# Patient Record
Sex: Female | Born: 1970 | Race: Black or African American | Hispanic: No | Marital: Single | Smoking: Never smoker
Health system: Southern US, Community
[De-identification: ages and names within clinical notes are randomized; demographics above are authoritative.]

## PROBLEM LIST (undated history)

## (undated) DIAGNOSIS — N840 Polyp of corpus uteri: Secondary | ICD-10-CM

## (undated) DIAGNOSIS — Z9221 Personal history of antineoplastic chemotherapy: Secondary | ICD-10-CM

## (undated) DIAGNOSIS — Z86711 Personal history of pulmonary embolism: Secondary | ICD-10-CM

## (undated) DIAGNOSIS — Z923 Personal history of irradiation: Secondary | ICD-10-CM

## (undated) DIAGNOSIS — N939 Abnormal uterine and vaginal bleeding, unspecified: Secondary | ICD-10-CM

## (undated) DIAGNOSIS — Z853 Personal history of malignant neoplasm of breast: Secondary | ICD-10-CM

## (undated) DIAGNOSIS — F419 Anxiety disorder, unspecified: Secondary | ICD-10-CM

## (undated) DIAGNOSIS — R030 Elevated blood-pressure reading, without diagnosis of hypertension: Secondary | ICD-10-CM

## (undated) DIAGNOSIS — Z973 Presence of spectacles and contact lenses: Secondary | ICD-10-CM

## (undated) DIAGNOSIS — M8580 Other specified disorders of bone density and structure, unspecified site: Secondary | ICD-10-CM

## (undated) DIAGNOSIS — J309 Allergic rhinitis, unspecified: Secondary | ICD-10-CM

## (undated) DIAGNOSIS — Z86718 Personal history of other venous thrombosis and embolism: Secondary | ICD-10-CM

## (undated) HISTORY — DX: Personal history of malignant neoplasm of breast: Z85.3

## (undated) HISTORY — DX: Anxiety disorder, unspecified: F41.9

## (undated) HISTORY — PX: OTHER SURGICAL HISTORY: SHX169

---

## 2000-09-24 ENCOUNTER — Encounter: Payer: Self-pay | Admitting: Emergency Medicine

## 2000-09-24 ENCOUNTER — Emergency Department (HOSPITAL_COMMUNITY): Admission: EM | Admit: 2000-09-24 | Discharge: 2000-09-24 | Payer: Self-pay | Admitting: Emergency Medicine

## 2004-02-14 ENCOUNTER — Emergency Department (HOSPITAL_COMMUNITY): Admission: EM | Admit: 2004-02-14 | Discharge: 2004-02-15 | Payer: Self-pay | Admitting: Emergency Medicine

## 2004-03-14 ENCOUNTER — Encounter: Admission: RE | Admit: 2004-03-14 | Discharge: 2004-03-14 | Payer: Self-pay | Admitting: Cardiology

## 2004-04-17 ENCOUNTER — Other Ambulatory Visit: Admission: RE | Admit: 2004-04-17 | Discharge: 2004-04-17 | Payer: Self-pay | Admitting: Obstetrics & Gynecology

## 2004-11-21 ENCOUNTER — Emergency Department (HOSPITAL_COMMUNITY): Admission: EM | Admit: 2004-11-21 | Discharge: 2004-11-21 | Payer: Self-pay | Admitting: Emergency Medicine

## 2005-08-05 ENCOUNTER — Emergency Department (HOSPITAL_COMMUNITY): Admission: EM | Admit: 2005-08-05 | Discharge: 2005-08-05 | Payer: Self-pay | Admitting: Family Medicine

## 2005-09-19 ENCOUNTER — Encounter: Admission: RE | Admit: 2005-09-19 | Discharge: 2005-09-19 | Payer: Self-pay | Admitting: Occupational Medicine

## 2007-01-05 ENCOUNTER — Emergency Department (HOSPITAL_COMMUNITY): Admission: EM | Admit: 2007-01-05 | Discharge: 2007-01-05 | Payer: Self-pay | Admitting: Family Medicine

## 2008-11-15 ENCOUNTER — Ambulatory Visit: Payer: Self-pay | Admitting: Sports Medicine

## 2008-11-15 DIAGNOSIS — K219 Gastro-esophageal reflux disease without esophagitis: Secondary | ICD-10-CM | POA: Insufficient documentation

## 2008-11-15 DIAGNOSIS — M20039 Swan-neck deformity of unspecified finger(s): Secondary | ICD-10-CM | POA: Insufficient documentation

## 2009-01-31 ENCOUNTER — Emergency Department (HOSPITAL_COMMUNITY): Admission: EM | Admit: 2009-01-31 | Discharge: 2009-01-31 | Payer: Self-pay | Admitting: Family Medicine

## 2009-11-03 ENCOUNTER — Encounter: Admission: RE | Admit: 2009-11-03 | Discharge: 2009-11-03 | Payer: Self-pay | Admitting: Obstetrics and Gynecology

## 2009-11-10 ENCOUNTER — Encounter: Admission: RE | Admit: 2009-11-10 | Discharge: 2009-11-10 | Payer: Self-pay | Admitting: Obstetrics and Gynecology

## 2009-11-18 ENCOUNTER — Ambulatory Visit (HOSPITAL_COMMUNITY): Admission: RE | Admit: 2009-11-18 | Discharge: 2009-11-18 | Payer: Self-pay | Admitting: General Surgery

## 2009-11-21 ENCOUNTER — Ambulatory Visit: Payer: Self-pay | Admitting: Genetic Counselor

## 2009-11-29 ENCOUNTER — Ambulatory Visit (HOSPITAL_COMMUNITY): Admission: RE | Admit: 2009-11-29 | Discharge: 2009-11-29 | Payer: Self-pay | Admitting: General Surgery

## 2009-11-30 ENCOUNTER — Encounter: Admission: RE | Admit: 2009-11-30 | Discharge: 2009-11-30 | Payer: Self-pay | Admitting: General Surgery

## 2009-11-30 ENCOUNTER — Ambulatory Visit (HOSPITAL_BASED_OUTPATIENT_CLINIC_OR_DEPARTMENT_OTHER): Admission: RE | Admit: 2009-11-30 | Discharge: 2009-12-01 | Payer: Self-pay | Admitting: General Surgery

## 2009-11-30 HISTORY — PX: BREAST LUMPECTOMY WITH AXILLARY LYMPH NODE DISSECTION: SHX5756

## 2009-12-20 ENCOUNTER — Ambulatory Visit: Payer: Self-pay | Admitting: Oncology

## 2009-12-20 LAB — CBC WITH DIFFERENTIAL/PLATELET
BASO%: 0.7 % (ref 0.0–2.0)
EOS%: 0.1 % (ref 0.0–7.0)
LYMPH%: 32.6 % (ref 14.0–49.7)
MCH: 34.6 pg — ABNORMAL HIGH (ref 25.1–34.0)
MCHC: 33.8 g/dL (ref 31.5–36.0)
MCV: 102.3 fL — ABNORMAL HIGH (ref 79.5–101.0)
MONO#: 0.4 10*3/uL (ref 0.1–0.9)
MONO%: 6.9 % (ref 0.0–14.0)
Platelets: 219 10*3/uL (ref 145–400)
RBC: 4.07 10*6/uL (ref 3.70–5.45)
WBC: 6.5 10*3/uL (ref 3.9–10.3)

## 2009-12-20 LAB — COMPREHENSIVE METABOLIC PANEL
ALT: 15 U/L (ref 0–35)
AST: 14 U/L (ref 0–37)
Alkaline Phosphatase: 111 U/L (ref 39–117)
Sodium: 141 mEq/L (ref 135–145)
Total Bilirubin: 0.2 mg/dL — ABNORMAL LOW (ref 0.3–1.2)
Total Protein: 6.8 g/dL (ref 6.0–8.3)

## 2009-12-27 ENCOUNTER — Ambulatory Visit: Payer: Self-pay | Admitting: Cardiology

## 2009-12-27 ENCOUNTER — Encounter: Payer: Self-pay | Admitting: Oncology

## 2009-12-27 ENCOUNTER — Ambulatory Visit: Admission: RE | Admit: 2009-12-27 | Discharge: 2009-12-27 | Payer: Self-pay | Admitting: Oncology

## 2010-01-03 ENCOUNTER — Ambulatory Visit (HOSPITAL_COMMUNITY): Admission: RE | Admit: 2010-01-03 | Discharge: 2010-01-03 | Payer: Self-pay | Admitting: Oncology

## 2010-01-10 ENCOUNTER — Ambulatory Visit (HOSPITAL_BASED_OUTPATIENT_CLINIC_OR_DEPARTMENT_OTHER): Admission: RE | Admit: 2010-01-10 | Discharge: 2010-01-10 | Payer: Self-pay | Admitting: General Surgery

## 2010-01-10 LAB — CBC WITH DIFFERENTIAL/PLATELET
BASO%: 0.5 % (ref 0.0–2.0)
Basophils Absolute: 0 10*3/uL (ref 0.0–0.1)
Eosinophils Absolute: 0 10*3/uL (ref 0.0–0.5)
HCT: 41.9 % (ref 34.8–46.6)
LYMPH%: 31.1 % (ref 14.0–49.7)
MCH: 35.2 pg — ABNORMAL HIGH (ref 25.1–34.0)
NEUT#: 3.8 10*3/uL (ref 1.5–6.5)
NEUT%: 60 % (ref 38.4–76.8)
RBC: 4.11 10*6/uL (ref 3.70–5.45)
lymph#: 1.9 10*3/uL (ref 0.9–3.3)

## 2010-01-19 ENCOUNTER — Other Ambulatory Visit: Payer: Self-pay | Admitting: Oncology

## 2010-01-19 LAB — CBC WITH DIFFERENTIAL/PLATELET
BASO%: 0.6 % (ref 0.0–2.0)
EOS%: 0.6 % (ref 0.0–7.0)
HGB: 14.1 g/dL (ref 11.6–15.9)
MCHC: 33.1 g/dL (ref 31.5–36.0)
MONO#: 0.5 10*3/uL (ref 0.1–0.9)
NEUT#: 1.7 10*3/uL (ref 1.5–6.5)
Platelets: 80 10*3/uL — ABNORMAL LOW (ref 145–400)
nRBC: 0 % (ref 0–0)

## 2010-01-29 ENCOUNTER — Ambulatory Visit: Payer: Self-pay | Admitting: Oncology

## 2010-01-31 LAB — COMPREHENSIVE METABOLIC PANEL
ALT: 24 U/L (ref 0–35)
Alkaline Phosphatase: 97 U/L (ref 39–117)
BUN: 10 mg/dL (ref 6–23)
Calcium: 9.3 mg/dL (ref 8.4–10.5)
Creatinine, Ser: 0.85 mg/dL (ref 0.40–1.20)
Glucose, Bld: 105 mg/dL — ABNORMAL HIGH (ref 70–99)
Sodium: 141 mEq/L (ref 135–145)
Total Bilirubin: 0.5 mg/dL (ref 0.3–1.2)

## 2010-01-31 LAB — CBC WITH DIFFERENTIAL/PLATELET
Basophils Absolute: 0 10*3/uL (ref 0.0–0.1)
Eosinophils Absolute: 0 10*3/uL (ref 0.0–0.5)
HCT: 41.7 % (ref 34.8–46.6)
LYMPH%: 5 % — ABNORMAL LOW (ref 14.0–49.7)
MCV: 102.7 fL — ABNORMAL HIGH (ref 79.5–101.0)
MONO%: 0.9 % (ref 0.0–14.0)
NEUT%: 93.7 % — ABNORMAL HIGH (ref 38.4–76.8)

## 2010-02-09 LAB — CBC WITH DIFFERENTIAL/PLATELET
EOS%: 0.6 % (ref 0.0–7.0)
Eosinophils Absolute: 0 10*3/uL (ref 0.0–0.5)
MCH: 33.5 pg (ref 25.1–34.0)
MCHC: 32.9 g/dL (ref 31.5–36.0)
MCV: 101.9 fL — ABNORMAL HIGH (ref 79.5–101.0)
MONO#: 0.5 10*3/uL (ref 0.1–0.9)
Platelets: 88 10*3/uL — ABNORMAL LOW (ref 145–400)
RBC: 4.12 10*6/uL (ref 3.70–5.45)
RDW: 12.4 % (ref 11.2–14.5)
WBC: 3.3 10*3/uL — ABNORMAL LOW (ref 3.9–10.3)
lymph#: 1.2 10*3/uL (ref 0.9–3.3)

## 2010-02-22 LAB — COMPREHENSIVE METABOLIC PANEL
ALT: 29 U/L (ref 0–35)
AST: 22 U/L (ref 0–37)
Albumin: 4.2 g/dL (ref 3.5–5.2)
Alkaline Phosphatase: 91 U/L (ref 39–117)
BUN: 15 mg/dL (ref 6–23)
Chloride: 104 mEq/L (ref 96–112)
Creatinine, Ser: 0.8 mg/dL (ref 0.40–1.20)
Glucose, Bld: 122 mg/dL — ABNORMAL HIGH (ref 70–99)
Sodium: 141 mEq/L (ref 135–145)
Total Bilirubin: 0.3 mg/dL (ref 0.3–1.2)
Total Protein: 7.1 g/dL (ref 6.0–8.3)

## 2010-02-22 LAB — CBC WITH DIFFERENTIAL/PLATELET
MCH: 33.9 pg (ref 25.1–34.0)
MONO%: 5.2 % (ref 0.0–14.0)
NEUT#: 13.5 10*3/uL — ABNORMAL HIGH (ref 1.5–6.5)
Platelets: 384 10*3/uL (ref 145–400)
WBC: 15.1 10*3/uL — ABNORMAL HIGH (ref 3.9–10.3)

## 2010-02-28 ENCOUNTER — Ambulatory Visit: Payer: Self-pay | Admitting: Oncology

## 2010-03-02 LAB — CBC WITH DIFFERENTIAL/PLATELET
Basophils Absolute: 0 10*3/uL (ref 0.0–0.1)
HCT: 38.6 % (ref 34.8–46.6)
HGB: 12.9 g/dL (ref 11.6–15.9)
NEUT#: 1.3 10*3/uL — ABNORMAL LOW (ref 1.5–6.5)
NEUT%: 45.1 % (ref 38.4–76.8)
RBC: 3.78 10*6/uL (ref 3.70–5.45)
WBC: 2.8 10*3/uL — ABNORMAL LOW (ref 3.9–10.3)

## 2010-03-15 LAB — COMPREHENSIVE METABOLIC PANEL
Alkaline Phosphatase: 79 U/L (ref 39–117)
BUN: 14 mg/dL (ref 6–23)
CO2: 23 mEq/L (ref 19–32)
Chloride: 107 mEq/L (ref 96–112)
Creatinine, Ser: 0.74 mg/dL (ref 0.40–1.20)
Total Bilirubin: 0.3 mg/dL (ref 0.3–1.2)

## 2010-03-15 LAB — CBC WITH DIFFERENTIAL/PLATELET
BASO%: 0.1 % (ref 0.0–2.0)
Basophils Absolute: 0 10*3/uL (ref 0.0–0.1)
EOS%: 0.1 % (ref 0.0–7.0)
Eosinophils Absolute: 0 10*3/uL (ref 0.0–0.5)
HCT: 37.8 % (ref 34.8–46.6)
MCH: 35 pg — ABNORMAL HIGH (ref 25.1–34.0)
MCV: 104.1 fL — ABNORMAL HIGH (ref 79.5–101.0)
MONO#: 0.4 10*3/uL (ref 0.1–0.9)
NEUT%: 93 % — ABNORMAL HIGH (ref 38.4–76.8)
lymph#: 0.5 10*3/uL — ABNORMAL LOW (ref 0.9–3.3)

## 2010-03-23 LAB — CBC WITH DIFFERENTIAL/PLATELET
BASO%: 0.6 % (ref 0.0–2.0)
HCT: 40.3 % (ref 34.8–46.6)
HGB: 13.3 g/dL (ref 11.6–15.9)
LYMPH%: 41.4 % (ref 14.0–49.7)
MCH: 34.4 pg — ABNORMAL HIGH (ref 25.1–34.0)
MCHC: 33 g/dL (ref 31.5–36.0)
MCV: 104.1 fL — ABNORMAL HIGH (ref 79.5–101.0)
MONO#: 0.7 10*3/uL (ref 0.1–0.9)
MONO%: 20.9 % — ABNORMAL HIGH (ref 0.0–14.0)
NEUT#: 1.2 10*3/uL — ABNORMAL LOW (ref 1.5–6.5)
Platelets: 73 10*3/uL — ABNORMAL LOW (ref 145–400)
RBC: 3.87 10*6/uL (ref 3.70–5.45)

## 2010-04-03 ENCOUNTER — Ambulatory Visit: Payer: Self-pay | Admitting: Oncology

## 2010-04-03 IMAGING — CR DG CHEST 1V PORT
1 series · 1 of 1 positions shown · non-contrast
Comparison: 11/29/2009

CLINICAL DATA: History of Port-A-Cath insertion.

PORTABLE CHEST - 1 VIEW

[view not recorded]
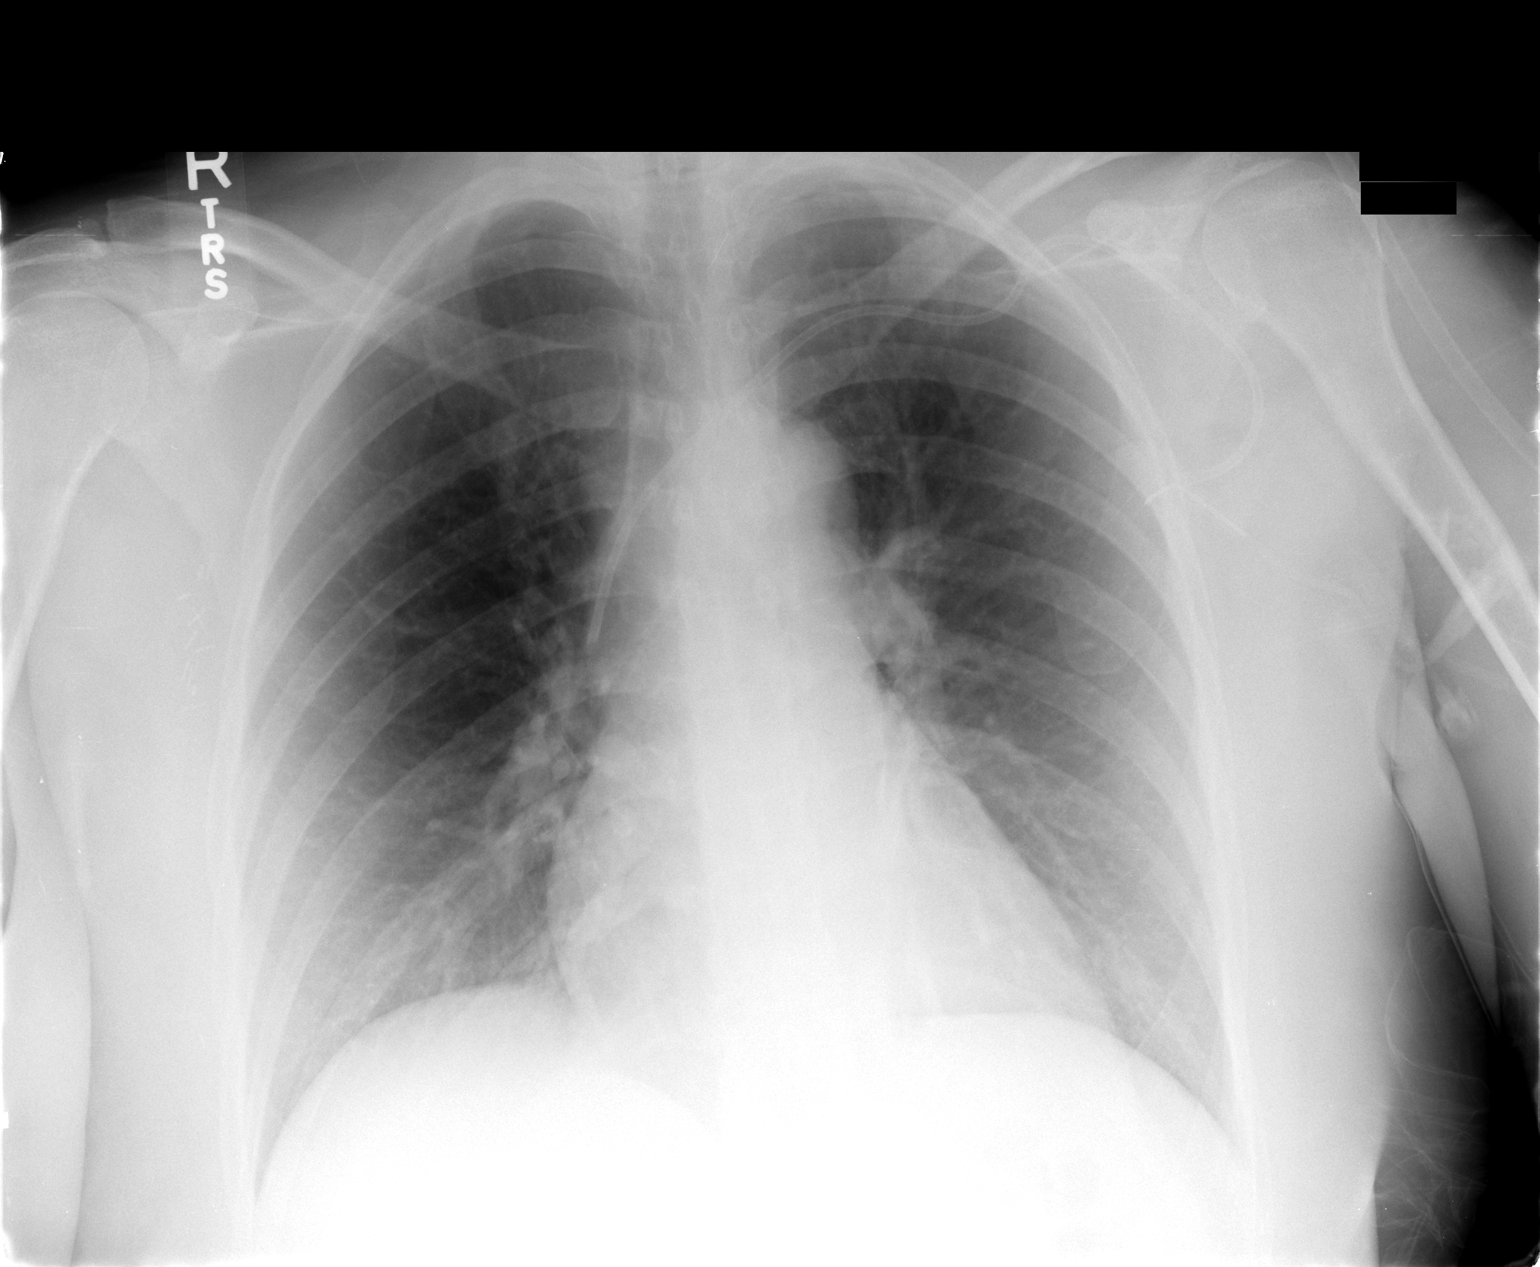

[1 of 1 positions shown; findings below may reference images not displayed]

FINDINGS: Left sided brachiocephalic approach Port-A-Cath is in
place with port on the left and tip of the catheter in superior
vena cava.  No pneumothorax is evident.  Lungs are free of
infiltrates. The cardiac silhouette is normal size and shape. Bones
appear average for age.
IMPRESSION: Tip of Port-A-Cath is in proximal superior vena cava.  No
pneumothorax is seen.

## 2010-04-05 LAB — CBC WITH DIFFERENTIAL/PLATELET
Basophils Absolute: 0 10*3/uL (ref 0.0–0.1)
EOS%: 0 % (ref 0.0–7.0)
LYMPH%: 3.7 % — ABNORMAL LOW (ref 14.0–49.7)
MCHC: 33.2 g/dL (ref 31.5–36.0)
MONO#: 0.7 10*3/uL (ref 0.1–0.9)
MONO%: 4.3 % (ref 0.0–14.0)
NEUT%: 91.8 % — ABNORMAL HIGH (ref 38.4–76.8)
Platelets: 355 10*3/uL (ref 145–400)
RBC: 3.46 10*6/uL — ABNORMAL LOW (ref 3.70–5.45)
WBC: 15.9 10*3/uL — ABNORMAL HIGH (ref 3.9–10.3)
nRBC: 0 % (ref 0–0)

## 2010-04-05 LAB — COMPREHENSIVE METABOLIC PANEL
ALT: 63 U/L — ABNORMAL HIGH (ref 0–35)
Alkaline Phosphatase: 83 U/L (ref 39–117)
BUN: 12 mg/dL (ref 6–23)
Calcium: 9.8 mg/dL (ref 8.4–10.5)
Glucose, Bld: 139 mg/dL — ABNORMAL HIGH (ref 70–99)
Potassium: 3.9 mEq/L (ref 3.5–5.3)
Total Bilirubin: 0.3 mg/dL (ref 0.3–1.2)
Total Protein: 6.8 g/dL (ref 6.0–8.3)

## 2010-04-12 LAB — CBC WITH DIFFERENTIAL/PLATELET
Basophils Absolute: 0 10*3/uL (ref 0.0–0.1)
HCT: 35.6 % (ref 34.8–46.6)
HGB: 11.9 g/dL (ref 11.6–15.9)
NEUT%: 3.1 % — ABNORMAL LOW (ref 38.4–76.8)
RBC: 3.35 10*6/uL — ABNORMAL LOW (ref 3.70–5.45)
RDW: 13.9 % (ref 11.2–14.5)

## 2010-04-19 LAB — CBC WITH DIFFERENTIAL/PLATELET
Basophils Absolute: 0 10*3/uL (ref 0.0–0.1)
LYMPH%: 14 % (ref 14.0–49.7)
MCH: 35.5 pg — ABNORMAL HIGH (ref 25.1–34.0)
MCHC: 32.3 g/dL (ref 31.5–36.0)
MONO%: 11.1 % (ref 0.0–14.0)
NEUT#: 9 10*3/uL — ABNORMAL HIGH (ref 1.5–6.5)
NEUT%: 74.7 % (ref 38.4–76.8)
Platelets: 160 10*3/uL (ref 145–400)
RDW: 15.2 % — ABNORMAL HIGH (ref 11.2–14.5)
lymph#: 1.7 10*3/uL (ref 0.9–3.3)

## 2010-04-26 LAB — COMPREHENSIVE METABOLIC PANEL
ALT: 43 U/L — ABNORMAL HIGH (ref 0–35)
AST: 31 U/L (ref 0–37)
Alkaline Phosphatase: 77 U/L (ref 39–117)
BUN: 10 mg/dL (ref 6–23)
CO2: 22 mEq/L (ref 19–32)
Sodium: 138 mEq/L (ref 135–145)

## 2010-04-26 LAB — CBC WITH DIFFERENTIAL/PLATELET
EOS%: 0 % (ref 0.0–7.0)
HGB: 12.1 g/dL (ref 11.6–15.9)
MCHC: 33.1 g/dL (ref 31.5–36.0)
MONO#: 0.6 10*3/uL (ref 0.1–0.9)
MONO%: 4.8 % (ref 0.0–14.0)
NEUT#: 11.9 10*3/uL — ABNORMAL HIGH (ref 1.5–6.5)
NEUT%: 90.7 % — ABNORMAL HIGH (ref 38.4–76.8)
WBC: 13.1 10*3/uL — ABNORMAL HIGH (ref 3.9–10.3)
lymph#: 0.6 10*3/uL — ABNORMAL LOW (ref 0.9–3.3)
nRBC: 0 % (ref 0–0)

## 2010-04-30 ENCOUNTER — Ambulatory Visit: Admission: RE | Admit: 2010-04-30 | Discharge: 2010-07-23 | Payer: Self-pay | Admitting: Radiation Oncology

## 2010-05-03 ENCOUNTER — Ambulatory Visit: Payer: Self-pay | Admitting: Oncology

## 2010-05-04 LAB — CBC WITH DIFFERENTIAL/PLATELET
BASO%: 1 % (ref 0.0–2.0)
Eosinophils Absolute: 0 10*3/uL (ref 0.0–0.5)
LYMPH%: 41.9 % (ref 14.0–49.7)
MCH: 36.2 pg — ABNORMAL HIGH (ref 25.1–34.0)
MCV: 109.3 fL — ABNORMAL HIGH (ref 79.5–101.0)
Platelets: 57 10*3/uL — ABNORMAL LOW (ref 145–400)
RBC: 3.12 10*6/uL — ABNORMAL LOW (ref 3.70–5.45)
RDW: 13.3 % (ref 11.2–14.5)
WBC: 2 10*3/uL — ABNORMAL LOW (ref 3.9–10.3)

## 2010-05-09 ENCOUNTER — Ambulatory Visit (HOSPITAL_BASED_OUTPATIENT_CLINIC_OR_DEPARTMENT_OTHER): Admission: RE | Admit: 2010-05-09 | Discharge: 2010-05-09 | Payer: Self-pay | Admitting: General Surgery

## 2010-05-29 LAB — CBC WITH DIFFERENTIAL/PLATELET
Basophils Absolute: 0 10*3/uL (ref 0.0–0.1)
Eosinophils Absolute: 0 10*3/uL (ref 0.0–0.5)
HGB: 13.1 g/dL (ref 11.6–15.9)
MCV: 111 fL — ABNORMAL HIGH (ref 79.5–101.0)
MONO#: 0.8 10*3/uL (ref 0.1–0.9)
MONO%: 13.4 % (ref 0.0–14.0)
NEUT%: 58.9 % (ref 38.4–76.8)
Platelets: 184 10*3/uL (ref 145–400)
WBC: 5.6 10*3/uL (ref 3.9–10.3)
nRBC: 0 % (ref 0–0)

## 2010-06-04 ENCOUNTER — Encounter: Admission: RE | Admit: 2010-06-04 | Discharge: 2010-06-05 | Payer: Self-pay | Admitting: Oncology

## 2010-07-06 ENCOUNTER — Ambulatory Visit: Payer: Self-pay | Admitting: Oncology

## 2010-07-10 LAB — COMPREHENSIVE METABOLIC PANEL
AST: 19 U/L (ref 0–37)
BUN: 11 mg/dL (ref 6–23)
CO2: 25 mEq/L (ref 19–32)
Calcium: 9.8 mg/dL (ref 8.4–10.5)
Potassium: 3.7 mEq/L (ref 3.5–5.3)
Sodium: 142 mEq/L (ref 135–145)

## 2010-07-10 LAB — CBC WITH DIFFERENTIAL/PLATELET
EOS%: 0.9 % (ref 0.0–7.0)
Eosinophils Absolute: 0 10*3/uL (ref 0.0–0.5)
HCT: 40 % (ref 34.8–46.6)
HGB: 13.9 g/dL (ref 11.6–15.9)
LYMPH%: 16.4 % (ref 14.0–49.7)
MCH: 36.3 pg — ABNORMAL HIGH (ref 25.1–34.0)
MCHC: 34.7 g/dL (ref 31.5–36.0)
MONO#: 0.5 10*3/uL (ref 0.1–0.9)
NEUT#: 3.6 10*3/uL (ref 1.5–6.5)
Platelets: 159 10*3/uL (ref 145–400)
RBC: 3.83 10*6/uL (ref 3.70–5.45)
lymph#: 0.8 10*3/uL — ABNORMAL LOW (ref 0.9–3.3)

## 2010-07-10 LAB — CANCER ANTIGEN 27.29: CA 27.29: 20 U/mL (ref 0–39)

## 2010-09-14 ENCOUNTER — Ambulatory Visit: Payer: Self-pay | Admitting: Oncology

## 2010-09-18 LAB — CBC WITH DIFFERENTIAL/PLATELET
Basophils Absolute: 0 10*3/uL (ref 0.0–0.1)
HCT: 40.8 % (ref 34.8–46.6)
HGB: 13.8 g/dL (ref 11.6–15.9)
LYMPH%: 26.6 % (ref 14.0–49.7)
MCH: 34.7 pg — ABNORMAL HIGH (ref 25.1–34.0)
NEUT#: 3.6 10*3/uL (ref 1.5–6.5)
Platelets: 175 10*3/uL (ref 145–400)
RBC: 3.98 10*6/uL (ref 3.70–5.45)
RDW: 12.5 % (ref 11.2–14.5)
WBC: 5.6 10*3/uL (ref 3.9–10.3)
nRBC: 0 % (ref 0–0)

## 2010-11-19 ENCOUNTER — Encounter: Admission: RE | Admit: 2010-11-19 | Discharge: 2010-11-19 | Payer: Self-pay | Admitting: Oncology

## 2010-12-03 ENCOUNTER — Ambulatory Visit: Payer: Self-pay | Admitting: Oncology

## 2010-12-05 LAB — CBC WITH DIFFERENTIAL/PLATELET
Eosinophils Absolute: 0 10*3/uL (ref 0.0–0.5)
LYMPH%: 17.1 % (ref 14.0–49.7)
MCH: 35.7 pg — ABNORMAL HIGH (ref 25.1–34.0)
MCV: 103.9 fL — ABNORMAL HIGH (ref 79.5–101.0)
Platelets: 202 10*3/uL (ref 145–400)
RBC: 4.03 10*6/uL (ref 3.70–5.45)
lymph#: 1 10*3/uL (ref 0.9–3.3)

## 2010-12-06 LAB — COMPREHENSIVE METABOLIC PANEL
ALT: 13 U/L (ref 0–35)
Alkaline Phosphatase: 90 U/L (ref 39–117)
Sodium: 141 mEq/L (ref 135–145)
Total Bilirubin: 0.5 mg/dL (ref 0.3–1.2)
Total Protein: 7.3 g/dL (ref 6.0–8.3)

## 2010-12-06 LAB — VITAMIN D 25 HYDROXY (VIT D DEFICIENCY, FRACTURES): Vit D, 25-Hydroxy: 23 ng/mL — ABNORMAL LOW (ref 30–89)

## 2011-01-13 ENCOUNTER — Encounter: Payer: Self-pay | Admitting: Cardiology

## 2011-02-20 ENCOUNTER — Inpatient Hospital Stay (INDEPENDENT_AMBULATORY_CARE_PROVIDER_SITE_OTHER)
Admission: RE | Admit: 2011-02-20 | Discharge: 2011-02-20 | Disposition: A | Discharge status: Home / Self Care | Payer: Commercial Managed Care - PPO | Source: Ambulatory Visit | Attending: Emergency Medicine | Admitting: Emergency Medicine

## 2011-02-20 DIAGNOSIS — R0982 Postnasal drip: Secondary | ICD-10-CM

## 2011-02-20 DIAGNOSIS — J019 Acute sinusitis, unspecified: Secondary | ICD-10-CM

## 2011-02-27 ENCOUNTER — Encounter: Payer: Self-pay | Admitting: *Deleted

## 2011-03-10 LAB — POCT HEMOGLOBIN-HEMACUE: Hemoglobin: 15 g/dL (ref 12.0–15.0)

## 2011-03-13 ENCOUNTER — Encounter (HOSPITAL_BASED_OUTPATIENT_CLINIC_OR_DEPARTMENT_OTHER): Payer: Commercial Managed Care - PPO | Admitting: Oncology

## 2011-03-13 ENCOUNTER — Other Ambulatory Visit: Payer: Self-pay | Admitting: Oncology

## 2011-03-13 DIAGNOSIS — Z170 Estrogen receptor positive status [ER+]: Secondary | ICD-10-CM

## 2011-03-13 DIAGNOSIS — C50419 Malignant neoplasm of upper-outer quadrant of unspecified female breast: Secondary | ICD-10-CM

## 2011-03-13 DIAGNOSIS — L0100 Impetigo, unspecified: Secondary | ICD-10-CM

## 2011-03-13 LAB — COMPREHENSIVE METABOLIC PANEL
ALT: 21 U/L (ref 0–35)
Albumin: 4.1 g/dL (ref 3.5–5.2)
CO2: 25 mEq/L (ref 19–32)
Calcium: 9.3 mg/dL (ref 8.4–10.5)
Chloride: 106 mEq/L (ref 96–112)
Glucose, Bld: 104 mg/dL — ABNORMAL HIGH (ref 70–99)
Sodium: 142 mEq/L (ref 135–145)
Total Bilirubin: 0.3 mg/dL (ref 0.3–1.2)
Total Protein: 6.7 g/dL (ref 6.0–8.3)

## 2011-03-13 LAB — CBC WITH DIFFERENTIAL/PLATELET
Eosinophils Absolute: 0 10*3/uL (ref 0.0–0.5)
HCT: 38 % (ref 34.8–46.6)
LYMPH%: 22.4 % (ref 14.0–49.7)
MONO#: 0.4 10*3/uL (ref 0.1–0.9)
NEUT#: 3.3 10*3/uL (ref 1.5–6.5)
Platelets: 187 10*3/uL (ref 145–400)
RBC: 3.72 10*6/uL (ref 3.70–5.45)
WBC: 4.8 10*3/uL (ref 3.9–10.3)
lymph#: 1.1 10*3/uL (ref 0.9–3.3)

## 2011-03-13 LAB — LUTEINIZING HORMONE: LH: 28.6 m[IU]/mL

## 2011-03-13 LAB — FOLLICLE STIMULATING HORMONE: FSH: 23.2 m[IU]/mL

## 2011-03-14 ENCOUNTER — Other Ambulatory Visit: Payer: Self-pay | Admitting: Oncology

## 2011-03-14 DIAGNOSIS — Z9889 Other specified postprocedural states: Secondary | ICD-10-CM

## 2011-03-21 LAB — ESTRADIOL, ULTRA SENS: Estradiol, Ultra Sensitive: 5 pg/mL

## 2011-03-26 LAB — POCT HEMOGLOBIN-HEMACUE: Hemoglobin: 14.9 g/dL (ref 12.0–15.0)

## 2011-06-06 ENCOUNTER — Other Ambulatory Visit: Payer: Self-pay | Admitting: Oncology

## 2011-06-06 ENCOUNTER — Encounter (HOSPITAL_BASED_OUTPATIENT_CLINIC_OR_DEPARTMENT_OTHER): Payer: Commercial Managed Care - PPO | Admitting: Oncology

## 2011-06-06 DIAGNOSIS — C50419 Malignant neoplasm of upper-outer quadrant of unspecified female breast: Secondary | ICD-10-CM

## 2011-06-06 DIAGNOSIS — L0100 Impetigo, unspecified: Secondary | ICD-10-CM

## 2011-06-06 DIAGNOSIS — Z170 Estrogen receptor positive status [ER+]: Secondary | ICD-10-CM

## 2011-06-06 LAB — COMPREHENSIVE METABOLIC PANEL
Albumin: 4.2 g/dL (ref 3.5–5.2)
BUN: 17 mg/dL (ref 6–23)
CO2: 25 mEq/L (ref 19–32)
Glucose, Bld: 96 mg/dL (ref 70–99)
Potassium: 3.8 mEq/L (ref 3.5–5.3)
Sodium: 142 mEq/L (ref 135–145)
Total Bilirubin: 0.4 mg/dL (ref 0.3–1.2)
Total Protein: 6.9 g/dL (ref 6.0–8.3)

## 2011-06-06 LAB — CBC WITH DIFFERENTIAL/PLATELET
BASO%: 0.3 % (ref 0.0–2.0)
EOS%: 0.4 % (ref 0.0–7.0)
HCT: 40.2 % (ref 34.8–46.6)
LYMPH%: 17.3 % (ref 14.0–49.7)
MCH: 34.9 pg — ABNORMAL HIGH (ref 25.1–34.0)
MCHC: 33.7 g/dL (ref 31.5–36.0)
MONO%: 6.5 % (ref 0.0–14.0)
NEUT%: 75.5 % (ref 38.4–76.8)
Platelets: 179 10*3/uL (ref 145–400)
RBC: 3.88 10*6/uL (ref 3.70–5.45)

## 2011-06-06 LAB — FOLLICLE STIMULATING HORMONE: FSH: 20.1 m[IU]/mL

## 2011-06-06 LAB — LUTEINIZING HORMONE: LH: 23.7 m[IU]/mL

## 2011-06-06 LAB — CANCER ANTIGEN 27.29: CA 27.29: 33 U/mL (ref 0–39)

## 2011-06-11 ENCOUNTER — Encounter (HOSPITAL_BASED_OUTPATIENT_CLINIC_OR_DEPARTMENT_OTHER): Payer: Commercial Managed Care - PPO | Admitting: Oncology

## 2011-06-11 DIAGNOSIS — K589 Irritable bowel syndrome without diarrhea: Secondary | ICD-10-CM

## 2011-06-11 DIAGNOSIS — Z170 Estrogen receptor positive status [ER+]: Secondary | ICD-10-CM

## 2011-06-11 DIAGNOSIS — C50419 Malignant neoplasm of upper-outer quadrant of unspecified female breast: Secondary | ICD-10-CM

## 2011-10-21 ENCOUNTER — Other Ambulatory Visit: Payer: Self-pay | Admitting: Oncology

## 2011-10-21 DIAGNOSIS — C50919 Malignant neoplasm of unspecified site of unspecified female breast: Secondary | ICD-10-CM

## 2011-11-21 ENCOUNTER — Ambulatory Visit
Admission: RE | Admit: 2011-11-21 | Discharge: 2011-11-21 | Disposition: A | Discharge status: Home / Self Care | Payer: Commercial Managed Care - PPO | Source: Ambulatory Visit | Attending: Oncology | Admitting: Oncology

## 2011-11-21 DIAGNOSIS — Z9889 Other specified postprocedural states: Secondary | ICD-10-CM

## 2011-11-23 ENCOUNTER — Telehealth: Payer: Self-pay | Admitting: Oncology

## 2011-11-23 NOTE — Telephone Encounter (Signed)
will gv pt appts for jan2013 on 12/03

## 2011-11-25 ENCOUNTER — Telehealth: Payer: Self-pay | Admitting: Oncology

## 2011-11-25 NOTE — Telephone Encounter (Signed)
called pt and provided appts for jan2013

## 2011-11-27 ENCOUNTER — Ambulatory Visit (HOSPITAL_COMMUNITY)
Admission: RE | Admit: 2011-11-27 | Discharge: 2011-11-27 | Disposition: A | Discharge status: Home / Self Care | Payer: 59 | Source: Ambulatory Visit | Attending: Oncology | Admitting: Oncology

## 2011-11-27 DIAGNOSIS — C50919 Malignant neoplasm of unspecified site of unspecified female breast: Secondary | ICD-10-CM

## 2011-11-27 DIAGNOSIS — Z853 Personal history of malignant neoplasm of breast: Secondary | ICD-10-CM | POA: Insufficient documentation

## 2011-11-27 MED ORDER — GADOBENATE DIMEGLUMINE 529 MG/ML IV SOLN
16.0000 mL | Freq: Once | INTRAVENOUS | Status: AC | PRN
  Administered 2011-11-27: 16 mL via INTRAVENOUS

## 2011-11-29 ENCOUNTER — Encounter: Payer: Self-pay | Admitting: Oncology

## 2011-11-29 ENCOUNTER — Other Ambulatory Visit: Payer: Self-pay | Admitting: Oncology

## 2011-11-29 ENCOUNTER — Other Ambulatory Visit: Payer: Self-pay | Admitting: Physician Assistant

## 2011-11-29 DIAGNOSIS — L0390 Cellulitis, unspecified: Secondary | ICD-10-CM

## 2011-11-29 DIAGNOSIS — L02519 Cutaneous abscess of unspecified hand: Secondary | ICD-10-CM

## 2011-11-29 MED ORDER — CEPHALEXIN 500 MG PO CAPS: 500.0000 mg | ORAL_CAPSULE | Freq: Four times a day (QID) | ORAL | Status: AC

## 2011-11-30 ENCOUNTER — Other Ambulatory Visit (HOSPITAL_COMMUNITY): Payer: Commercial Managed Care - PPO

## 2011-12-25 ENCOUNTER — Other Ambulatory Visit: Payer: Commercial Managed Care - PPO | Admitting: Lab

## 2011-12-30 ENCOUNTER — Other Ambulatory Visit: Payer: Self-pay | Admitting: Oncology

## 2011-12-30 ENCOUNTER — Other Ambulatory Visit: Payer: Commercial Managed Care - PPO | Admitting: Lab

## 2011-12-30 ENCOUNTER — Other Ambulatory Visit (HOSPITAL_BASED_OUTPATIENT_CLINIC_OR_DEPARTMENT_OTHER): Payer: 59 | Admitting: Lab

## 2011-12-30 DIAGNOSIS — C50419 Malignant neoplasm of upper-outer quadrant of unspecified female breast: Secondary | ICD-10-CM

## 2011-12-30 DIAGNOSIS — Z170 Estrogen receptor positive status [ER+]: Secondary | ICD-10-CM

## 2011-12-30 LAB — COMPREHENSIVE METABOLIC PANEL
Alkaline Phosphatase: 87 U/L (ref 39–117)
Glucose, Bld: 121 mg/dL — ABNORMAL HIGH (ref 70–99)
Sodium: 139 mEq/L (ref 135–145)
Total Bilirubin: 0.1 mg/dL — ABNORMAL LOW (ref 0.3–1.2)
Total Protein: 6.8 g/dL (ref 6.0–8.3)

## 2011-12-30 LAB — CBC WITH DIFFERENTIAL/PLATELET
Eosinophils Absolute: 0 10*3/uL (ref 0.0–0.5)
LYMPH%: 29.3 % (ref 14.0–49.7)
MCH: 35.1 pg — ABNORMAL HIGH (ref 25.1–34.0)
MCHC: 34.5 g/dL (ref 31.5–36.0)
MCV: 101.9 fL — ABNORMAL HIGH (ref 79.5–101.0)
MONO%: 6.1 % (ref 0.0–14.0)
NEUT#: 4 10*3/uL (ref 1.5–6.5)
Platelets: 203 10*3/uL (ref 145–400)
RBC: 4.01 10*6/uL (ref 3.70–5.45)

## 2011-12-31 ENCOUNTER — Ambulatory Visit: Payer: Commercial Managed Care - PPO | Admitting: Oncology

## 2012-01-06 ENCOUNTER — Encounter: Payer: Self-pay | Admitting: *Deleted

## 2012-01-07 ENCOUNTER — Ambulatory Visit (HOSPITAL_BASED_OUTPATIENT_CLINIC_OR_DEPARTMENT_OTHER): Payer: 59 | Admitting: Oncology

## 2012-01-07 VITALS — BP 135/95 | HR 105 | Temp 98.2°F | Ht 68.0 in | Wt 178.3 lb

## 2012-01-07 DIAGNOSIS — C50419 Malignant neoplasm of upper-outer quadrant of unspecified female breast: Secondary | ICD-10-CM

## 2012-01-07 DIAGNOSIS — C50919 Malignant neoplasm of unspecified site of unspecified female breast: Secondary | ICD-10-CM

## 2012-01-07 DIAGNOSIS — C50411 Malignant neoplasm of upper-outer quadrant of right female breast: Secondary | ICD-10-CM | POA: Insufficient documentation

## 2012-01-07 DIAGNOSIS — Z170 Estrogen receptor positive status [ER+]: Secondary | ICD-10-CM

## 2012-01-07 NOTE — Progress Notes (Signed)
ID: CAMARIE MCTIGUE  DOB: [birth year]  MR#: [id number]  CSN#: [id number]   Interval History:   Jenna Gonzales returns for followup of her breast cancer. The interval history is unremarkable. She did well over the holidays. This was the second Christmas after her mother's death. She is doing well and work. She is tolerating tamoxifen with mild to moderate hot flashes as the only side effect.  ROS:  She has had no unusual headaches, visual changes, nausea, vomiting, cough, phlegm production, pleurisy, shortness of breath, or change in bowel or bladder habits. There has been no rash, no unusual pain, no bleeding, no fever. A detailed review of systems was unremarkable. LMP March 2011  Allergies  no known drug allergies   Current Outpatient Prescriptions  Medication Sig Dispense Refill  . tamoxifen (NOLVADEX) 20 MG tablet Take 20 mg by mouth daily.      . folic acid (FOLVITE) 1 MG tablet Take 1 mg by mouth daily.         Objective:  Filed Vitals:   01/07/12 0937  BP: 135/95  Pulse: 105  Temp: 98.2 F (36.8 C)    BMI: Body mass index is 27.11 kg/(m^2).   ECOG FS: 0  Physical Exam:   Sclerae unicteric  Oropharynx clear  No peripheral adenopathy  Lungs clear -- no rales or rhonchi  Heart regular rate and rhythm  Abdomen benign  MSK no focal spinal tenderness, no peripheral edema  Neuro nonfocal  Breast exam: Right breast status post lumpectomy and radiation. No evidence of local recurrence. Left breast no suspicious findings.  Lab Results: CA 2729 is normal at 32     Chemistry      Component Value Date/Time   NA 139 12/30/2011 0808   NA 139 12/30/2011 0808   K 3.6 12/30/2011 0808   K 3.6 12/30/2011 0808   CL 103 12/30/2011 0808   CL 103 12/30/2011 0808   CO2 26 12/30/2011 0808   CO2 26 12/30/2011 0808   BUN 11 12/30/2011 0808   BUN 11 12/30/2011 0808   CREATININE 0.81 12/30/2011 0808   CREATININE 0.81 12/30/2011 0808      Component Value Date/Time   CALCIUM 9.7 12/30/2011 0808   CALCIUM 9.7  12/30/2011 0808   ALKPHOS 87 12/30/2011 0808   ALKPHOS 87 12/30/2011 0808   AST 17 12/30/2011 0808   AST 17 12/30/2011 0808   ALT 12 12/30/2011 0808   ALT 12 12/30/2011 0808   BILITOT 0.1* 12/30/2011 0808   BILITOT 0.1* 12/30/2011 0808       Lab Results  Component Value Date   WBC 6.3 12/30/2011   HGB 14.1 12/30/2011   HCT 40.8 12/30/2011   MCV 101.9* 12/30/2011   PLT 203 12/30/2011   NEUTROABS 4.0 12/30/2011    Studies/Results:  Most recent mammogram, November of 2012, was unremarkable.  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   Plan: There is no evidence of disease recurrence, and she is doing very well with the current treatment. She is going to see [place] again in 6 months, with repeat lab and physical exam. She will have her next  mammogram in December of this year and then see me again in January of next year again with lab work and physical exam. She knows to call for any problems that may develop before the next visit.  Jenna Gonzales C 01/07/2012

## 2012-03-02 ENCOUNTER — Encounter (INDEPENDENT_AMBULATORY_CARE_PROVIDER_SITE_OTHER): Payer: Self-pay | Admitting: General Surgery

## 2012-03-26 ENCOUNTER — Encounter: Payer: Self-pay | Admitting: Physician Assistant

## 2012-03-26 ENCOUNTER — Other Ambulatory Visit: Payer: Self-pay | Admitting: *Deleted

## 2012-03-26 DIAGNOSIS — R21 Rash and other nonspecific skin eruption: Secondary | ICD-10-CM

## 2012-03-26 DIAGNOSIS — C50919 Malignant neoplasm of unspecified site of unspecified female breast: Secondary | ICD-10-CM

## 2012-03-26 MED ORDER — FLUCONAZOLE 100 MG PO TABS: 100.0000 mg | ORAL_TABLET | Freq: Every day | ORAL | Status: AC

## 2012-03-26 NOTE — Progress Notes (Signed)
Jenna Gonzales came by the office this morning with complaints of a rash bilaterally on her breasts. This has been present for the last few days, and is extremely itchy. It is bilateral, on the lateral edges of both breasts, and extending up into the lower portion of the axillae.  Jenna Gonzales admits to recently changing her laundry detergent, but has had no other changes or no exposures.  She thought she felt a "lump" over the weekend in the left breast although she does not feel anything there today. (Recall that that is her surgical breast)  On exam, there is in fact an erythematous macular-papular rash bilaterally, primarily in the upper outer quadrants of the right and left breasts, and extending into the axillae bilaterally. No evidence of either pustules or vesicles. No palpable masses in either the right or left breast. Left breast is status post lumpectomy with well-healed incision. No additional skin changes. No rashes elsewhere.  This looks like either a fungal skin rash, or allergic dermatitis, perhaps associated with her recent change in laundry detergent. I have given Jenna Gonzales a prescription for Diflucan, 100 mg, to take over the next 5 days, and also suggested applying hydrocortisone cream topically for the itching. She'll let me know if this does not resolve by Monday and we can reassess at that time. Certainly if it worsens, she'll contact us sooner.  Zollie Scale, PA-C 03/26/2012

## 2012-04-01 ENCOUNTER — Other Ambulatory Visit: Payer: Self-pay | Admitting: *Deleted

## 2012-04-01 ENCOUNTER — Encounter: Payer: Self-pay | Admitting: Physician Assistant

## 2012-04-01 DIAGNOSIS — C50919 Malignant neoplasm of unspecified site of unspecified female breast: Secondary | ICD-10-CM

## 2012-04-01 DIAGNOSIS — L239 Allergic contact dermatitis, unspecified cause: Secondary | ICD-10-CM

## 2012-04-01 MED ORDER — METHYLPREDNISOLONE 4 MG PO KIT: PACK | ORAL | Status: AC

## 2012-04-01 NOTE — Progress Notes (Signed)
Jenna Gonzales stopped by the office  today to have [place] take a look at a rash that continues to bother her on the lateral edges of both breasts. She completed a course of Diflucan, and has also been utilizing over-the-counter hydrocortisone cream daily. This has helped significantly, but the rash is still present. It is slightly red, but the primary complaint is itching.  I have prescribed a Medrol dose pack to take for the next week, and encouraged her to continue that over-the-counter cream topically for itching. She is going to try a "Free and Clear" laundry detergent and switch to a gentle soap such as [place] or [place]. She will call or stop by next week with an update.  Zollie Scale, PA-C 04/01/2012

## 2012-04-02 ENCOUNTER — Other Ambulatory Visit: Payer: Self-pay | Admitting: *Deleted

## 2012-04-02 DIAGNOSIS — C50919 Malignant neoplasm of unspecified site of unspecified female breast: Secondary | ICD-10-CM

## 2012-04-02 MED ORDER — TRIAMCINOLONE ACETONIDE 0.5 % EX OINT: TOPICAL_OINTMENT | CUTANEOUS | Status: DC

## 2012-04-07 ENCOUNTER — Other Ambulatory Visit: Payer: Self-pay | Admitting: *Deleted

## 2012-04-07 ENCOUNTER — Ambulatory Visit (HOSPITAL_COMMUNITY)
Admission: RE | Admit: 2012-04-07 | Discharge: 2012-04-07 | Disposition: A | Discharge status: Home / Self Care | Payer: 59 | Source: Ambulatory Visit | Attending: Oncology | Admitting: Oncology

## 2012-04-07 DIAGNOSIS — C50919 Malignant neoplasm of unspecified site of unspecified female breast: Secondary | ICD-10-CM

## 2012-04-10 ENCOUNTER — Other Ambulatory Visit: Payer: Self-pay | Admitting: *Deleted

## 2012-04-10 DIAGNOSIS — C50919 Malignant neoplasm of unspecified site of unspecified female breast: Secondary | ICD-10-CM

## 2012-04-15 ENCOUNTER — Ambulatory Visit (HOSPITAL_BASED_OUTPATIENT_CLINIC_OR_DEPARTMENT_OTHER): Payer: 59 | Admitting: Lab

## 2012-04-15 DIAGNOSIS — C50919 Malignant neoplasm of unspecified site of unspecified female breast: Secondary | ICD-10-CM

## 2012-04-15 DIAGNOSIS — C50419 Malignant neoplasm of upper-outer quadrant of unspecified female breast: Secondary | ICD-10-CM

## 2012-04-15 LAB — CBC WITH DIFFERENTIAL/PLATELET
BASO%: 0.3 % (ref 0.0–2.0)
EOS%: 0.4 % (ref 0.0–7.0)
HCT: 40.7 % (ref 34.8–46.6)
LYMPH%: 32 % (ref 14.0–49.7)
MCH: 34.3 pg — ABNORMAL HIGH (ref 25.1–34.0)
MCHC: 34.4 g/dL (ref 31.5–36.0)
NEUT%: 58.5 % (ref 38.4–76.8)
Platelets: 182 10*3/uL (ref 145–400)
RBC: 4.08 10*6/uL (ref 3.70–5.45)

## 2012-04-15 LAB — COMPREHENSIVE METABOLIC PANEL
AST: 25 U/L (ref 0–37)
Alkaline Phosphatase: 82 U/L (ref 39–117)
BUN: 14 mg/dL (ref 6–23)
Creatinine, Ser: 0.86 mg/dL (ref 0.50–1.10)
Total Bilirubin: 0.3 mg/dL (ref 0.3–1.2)

## 2012-04-20 ENCOUNTER — Encounter (HOSPITAL_COMMUNITY): Payer: Self-pay

## 2012-04-20 ENCOUNTER — Emergency Department (HOSPITAL_COMMUNITY)
Admission: EM | Admit: 2012-04-20 | Discharge: 2012-04-20 | Disposition: A | Discharge status: Home / Self Care | Payer: 59 | Source: Home / Self Care | Attending: Family Medicine | Admitting: Family Medicine

## 2012-04-20 DIAGNOSIS — H6120 Impacted cerumen, unspecified ear: Secondary | ICD-10-CM

## 2012-04-20 DIAGNOSIS — J310 Chronic rhinitis: Secondary | ICD-10-CM

## 2012-04-20 DIAGNOSIS — H6990 Unspecified Eustachian tube disorder, unspecified ear: Secondary | ICD-10-CM

## 2012-04-20 MED ORDER — FLUTICASONE PROPIONATE 50 MCG/ACT NA SUSP: NASAL | Status: DC

## 2012-04-20 NOTE — ED Notes (Signed)
C/o frontal headaches, pressure behind eyes and nose, post nasal drainage and nasal secretions.  Sx for 2-3 weeks but getting worse.  BP elevated today- denies hx of HTN, states she gets anxious at the doctor.

## 2012-04-20 NOTE — ED Provider Notes (Signed)
History     CSN: [id number]  Arrival date & time 04/20/12  0902   First MD Initiated Contact with Patient 04/20/12 1010      Chief Complaint  Patient presents with  . Facial Pain  . Sinus Problem    (Consider location/radiation/quality/duration/timing/severity/associated sxs/prior treatment) HPI Comments: Jenna Gonzales presents for evaluation of sinus pressure, headache, nasal congestion, rhinorrhea, and intermittent dizziness. She reports a history of seasonal allergies for she's been taking Allegra-D. She denies any fever. She denies any history of hypertension but does report getting anxious at the doctor's office.  Patient is a 41 y.o. female presenting with neurologic complaint. The history is provided by the patient.  Neurologic Problem The primary symptoms include headaches and dizziness. The symptoms began more than 1 week ago. The symptoms are unchanged.  The headache began more than 2 days ago. Headache is a new problem. Location/region(s) of the headache: bilateral and frontal.  The dizziness began more than 1 week ago. The dizziness has been unchanged since its onset.  Additional symptoms include vertigo.    Past Medical History  Diagnosis Date  . Cellulitis 11/29/2011  . Breast cancer     Past Surgical History  Procedure Date  . Breast lumpectomy     No family history on file.  History  Substance Use Topics  . Smoking status: Never Smoker   . Smokeless tobacco: Not on file  . Alcohol Use: Yes    OB History    Grav Para Term Preterm Abortions TAB SAB Ect Mult Living                  Review of Systems  Constitutional: Negative.   HENT: Positive for ear pain, congestion, rhinorrhea and sinus pressure.   Eyes: Negative.   Respiratory: Negative.   Cardiovascular: Negative.   Gastrointestinal: Negative.   Genitourinary: Negative.   Musculoskeletal: Negative.   Skin: Negative.   Neurological: Positive for dizziness, vertigo, light-headedness and headaches.     Allergies  Review of patient's allergies indicates no known allergies.  Home Medications   Current Outpatient Rx  Name Route Sig Dispense Refill  . TAMOXIFEN CITRATE 20 MG PO TABS Oral Take 20 mg by mouth daily.    Marland Kitchen FLUTICASONE PROPIONATE 50 MCG/ACT NA SUSP  2 sprays each nostril once daily 16 g 2  . FOLIC ACID 1 MG PO TABS Oral Take 1 mg by mouth daily.    . TRIAMCINOLONE ACETONIDE 0.5 % EX OINT  0.1% strength  Apply bid prn to affected area 60 g 0    BP 158/102  Pulse 109  Temp(Src) 98.1 F (36.7 C) (Oral)  Resp 16  SpO2 99%  Physical Exam  Nursing note and vitals reviewed. Constitutional: She is oriented to person, place, and time. She appears well-developed and well-nourished.  HENT:  Head: Normocephalic and atraumatic.  Right Ear: Tympanic membrane is retracted.  Left Ear: Tympanic membrane is retracted.  Mouth/Throat: Uvula is midline, oropharynx is clear and moist and mucous membranes are normal.       Ears: TMs occluded bilaterally with cerumen; clear but retracted mildly bilaterally after irrigation   Eyes: EOM are normal.  Neck: Normal range of motion.  Cardiovascular: Normal rate and regular rhythm.   Pulmonary/Chest: Effort normal and breath sounds normal.  Musculoskeletal: Normal range of motion.  Neurological: She is alert and oriented to person, place, and time.  Skin: Skin is warm and dry.  Psychiatric: Her behavior is normal.    ED  Course  Procedures (including critical care time)  Labs Reviewed - No data to display No results found.   1. Cerumen impaction   2. Rhinitis   3. Eustachian tube disorder       MDM  Symptoms improved after ear irrigation; rx given for fluticasone; advised to continue OTC antihistamine        Renaee Munda, MD 04/20/12 1250

## 2012-04-20 NOTE — Discharge Instructions (Signed)
Use prescription nasal spray as directed. May also continue to use an over the counter antihistamine such as cetirizine (Zyrtec), or fexofenadine (Allegra). Return to care should your symptoms not improve, or worsen in any way.

## 2012-04-30 ENCOUNTER — Ambulatory Visit (INDEPENDENT_AMBULATORY_CARE_PROVIDER_SITE_OTHER): Payer: Self-pay | Admitting: General Surgery

## 2012-05-08 ENCOUNTER — Encounter: Payer: Self-pay | Admitting: Physician Assistant

## 2012-05-08 DIAGNOSIS — J329 Chronic sinusitis, unspecified: Secondary | ICD-10-CM

## 2012-05-08 MED ORDER — AZITHROMYCIN 1 G PO PACK: 1.0000 | PACK | Freq: Once | ORAL | Status: AC

## 2012-05-08 NOTE — Progress Notes (Signed)
Jenna Gonzales stopped by the office this morning with complaints of sinus congestion, fullness under the eyes, pain in the upper teeth, and discomfort in the ears. This has been going on for several weeks now. She has been trying some decongestants with minimal relief. Fortunately, she denies any fevers, chills, or night sweats.  I have prescribed a Z-Pak to take for the next 5 days. I have asked Taron to call us with any fevers or increased symptoms. If this does not take care of the problem, she will contact her primary care/urgent care Center.  Zollie Scale, PA-C 05/08/2012

## 2012-05-27 ENCOUNTER — Other Ambulatory Visit: Payer: Self-pay | Admitting: Physician Assistant

## 2012-05-28 ENCOUNTER — Telehealth: Payer: Self-pay | Admitting: Oncology

## 2012-05-28 NOTE — Telephone Encounter (Signed)
S/w the pt and she is aware of her r/s 07/06/2012 appts to the next avail date.

## 2012-06-01 ENCOUNTER — Other Ambulatory Visit: Payer: Self-pay | Admitting: *Deleted

## 2012-06-01 MED ORDER — ACYCLOVIR 5 % EX CREA: 1.0000 "application " | TOPICAL_CREAM | CUTANEOUS | Status: DC

## 2012-07-03 ENCOUNTER — Ambulatory Visit (INDEPENDENT_AMBULATORY_CARE_PROVIDER_SITE_OTHER): Payer: Self-pay | Admitting: General Surgery

## 2012-07-06 ENCOUNTER — Ambulatory Visit: Payer: 59 | Admitting: Physician Assistant

## 2012-07-06 ENCOUNTER — Other Ambulatory Visit: Payer: 59 | Admitting: Lab

## 2012-07-13 ENCOUNTER — Encounter (INDEPENDENT_AMBULATORY_CARE_PROVIDER_SITE_OTHER): Payer: Self-pay | Admitting: General Surgery

## 2012-07-13 ENCOUNTER — Ambulatory Visit (INDEPENDENT_AMBULATORY_CARE_PROVIDER_SITE_OTHER): Payer: Commercial Managed Care - PPO | Admitting: General Surgery

## 2012-07-13 VITALS — BP 148/100 | HR 96 | Temp 98.6°F | Resp 14 | Ht 68.5 in | Wt 177.0 lb

## 2012-07-13 DIAGNOSIS — C50919 Malignant neoplasm of unspecified site of unspecified female breast: Secondary | ICD-10-CM

## 2012-07-13 NOTE — Progress Notes (Signed)
Chief complaint: Follow breast cancer History: Patient returns for long-term followup status post right breast lumpectomy and axillary dissection for T2 N1 ER positive cancer of the breast. Surgery date was December of 2010. She underwent adjuvant chemotherapy and radiation and is now on tamoxifen.  She reports she is doing well. Specifically no breast lumps or pain, skin changes, nipple discharge or arm swelling.  Exam: General: Appears well Lymph nodes: No cervical, supraclavicular, or axillary nodes palpable Lungs: Clear equal breath sounds Breasts: Well-healed incisions right breast and axilla. No palpable masses in either breast. No skin changes.  Mammogram November 2012 was negative.  Assessment and plan: Doing well 2-1/2 years following diagnosis with no evidence of recurrence. Return in 6-9 months.

## 2012-07-14 ENCOUNTER — Other Ambulatory Visit: Payer: Self-pay | Admitting: Physician Assistant

## 2012-07-14 ENCOUNTER — Telehealth: Payer: Self-pay | Admitting: Oncology

## 2012-07-14 DIAGNOSIS — I10 Essential (primary) hypertension: Secondary | ICD-10-CM

## 2012-07-14 DIAGNOSIS — C50919 Malignant neoplasm of unspecified site of unspecified female breast: Secondary | ICD-10-CM

## 2012-07-14 NOTE — Telephone Encounter (Signed)
S/w the pt regarding her md appt at [facility]. Per pt she will keep the [facility] that [facility] [employer]. Per pt she will call to cancel the md appt with dr Sonny Masters Katrinka Blazing

## 2012-07-14 NOTE — Progress Notes (Signed)
Jenna Gonzales stopped by the office today and is concerned about her recent elevation in her blood pressure. She took her blood pressure here in our office this morning and it was elevated at 151/90. She tells me her systolic blood pressure was "about one 71" yesterday. She feels a little lightheaded at times, but this comes and goes. She's had no significant headaches, no change in vision, no shortness of breath, no chest pain or palpitations.  Jenna Gonzales asked for a recommendation for a local primary care/internal medicine physician. I am referring her to Dr. Rene Paci for an appointment as soon as possible to evaluate hypertension.  Zollie Scale, PA-C 07/14/2012

## 2012-07-15 ENCOUNTER — Ambulatory Visit (INDEPENDENT_AMBULATORY_CARE_PROVIDER_SITE_OTHER): Payer: 59 | Admitting: Emergency Medicine

## 2012-07-15 VITALS — BP 150/95 | HR 110 | Temp 99.0°F | Resp 18 | Ht 68.5 in | Wt 176.0 lb

## 2012-07-15 DIAGNOSIS — I10 Essential (primary) hypertension: Secondary | ICD-10-CM

## 2012-07-15 MED ORDER — METOPROLOL SUCCINATE ER 50 MG PO TB24: 50.0000 mg | ORAL_TABLET | Freq: Every day | ORAL | Status: DC

## 2012-07-15 NOTE — Progress Notes (Signed)
  Subjective:    Patient ID: Jenna Gonzales, female    DOB: 09/02/1971, 41 y.o.   MRN: [id number]  Hypertension This is a new problem. The current episode started 1 to 4 weeks ago. The problem is unchanged. The problem is uncontrolled. Associated symptoms include anxiety. Pertinent negatives include no blurred vision, chest pain, headaches, malaise/fatigue, neck pain, orthopnea, palpitations, peripheral edema, PND, shortness of breath or sweats. There are no associated agents to hypertension. Risk factors for coronary artery disease include no known risk factors. Past treatments include nothing. There are no compliance problems.  There is no history of angina, kidney disease, CAD/MI, CVA, heart failure, PVD, renovascular disease, retinopathy or a thyroid problem. There is no history of chronic renal disease, coarctation of the aorta, hyperaldosteronism, hypercortisolism, hyperparathyroidism, a hypertension causing med, pheochromocytoma or sleep apnea.      Review of Systems  Constitutional: Negative for malaise/fatigue.  HENT: Negative.  Negative for neck pain.   Eyes: Negative for blurred vision.  Respiratory: Negative.  Negative for shortness of breath.   Cardiovascular: Negative.  Negative for chest pain, palpitations, orthopnea and PND.  Musculoskeletal: Negative.   Neurological: Negative for headaches.       Objective:   Physical Exam  Constitutional: She is oriented to person, place, and time. She appears well-developed and well-nourished.  HENT:  Head: Normocephalic and atraumatic.  Eyes: Conjunctivae are normal. Pupils are equal, round, and reactive to light.  Neck: Normal range of motion. Neck supple. No thyromegaly present.  Cardiovascular: Normal rate and regular rhythm.   Pulmonary/Chest: Effort normal and breath sounds normal.  Abdominal: Soft.  Musculoskeletal: Normal range of motion.  Neurological: She is alert and oriented to person, place, and time.  Skin: Skin  is warm and dry.          Assessment & Plan:  Hypertension  Borderline Seasonal allergies  toprol xl Follow up in one month

## 2012-07-16 ENCOUNTER — Ambulatory Visit: Payer: 59

## 2012-07-16 DIAGNOSIS — C50419 Malignant neoplasm of upper-outer quadrant of unspecified female breast: Secondary | ICD-10-CM

## 2012-07-16 DIAGNOSIS — Z170 Estrogen receptor positive status [ER+]: Secondary | ICD-10-CM

## 2012-07-16 LAB — CBC WITH DIFFERENTIAL/PLATELET
Basophils Absolute: 0 10*3/uL (ref 0.0–0.1)
EOS%: 0.3 % (ref 0.0–7.0)
HCT: 41.5 % (ref 34.8–46.6)
HGB: 14 g/dL (ref 11.6–15.9)
MCH: 34.9 pg — ABNORMAL HIGH (ref 25.1–34.0)
MCV: 103.2 fL — ABNORMAL HIGH (ref 79.5–101.0)
MONO%: 7 % (ref 0.0–14.0)
NEUT%: 59 % (ref 38.4–76.8)
Platelets: 180 10*3/uL (ref 145–400)
lymph#: 2.1 10*3/uL (ref 0.9–3.3)

## 2012-07-16 LAB — COMPREHENSIVE METABOLIC PANEL
AST: 18 U/L (ref 0–37)
BUN: 15 mg/dL (ref 6–23)
Calcium: 9.8 mg/dL (ref 8.4–10.5)
Chloride: 103 mEq/L (ref 96–112)
Creatinine, Ser: 0.81 mg/dL (ref 0.50–1.10)

## 2012-07-17 ENCOUNTER — Other Ambulatory Visit (HOSPITAL_BASED_OUTPATIENT_CLINIC_OR_DEPARTMENT_OTHER): Payer: 59 | Admitting: Lab

## 2012-07-17 ENCOUNTER — Telehealth: Payer: Self-pay | Admitting: Oncology

## 2012-07-17 ENCOUNTER — Ambulatory Visit (HOSPITAL_BASED_OUTPATIENT_CLINIC_OR_DEPARTMENT_OTHER): Payer: 59 | Admitting: Physician Assistant

## 2012-07-17 ENCOUNTER — Encounter: Payer: Self-pay | Admitting: Physician Assistant

## 2012-07-17 VITALS — BP 139/98 | HR 96 | Temp 98.4°F | Ht 68.5 in | Wt 175.2 lb

## 2012-07-17 DIAGNOSIS — C50919 Malignant neoplasm of unspecified site of unspecified female breast: Secondary | ICD-10-CM

## 2012-07-17 DIAGNOSIS — G4700 Insomnia, unspecified: Secondary | ICD-10-CM

## 2012-07-17 DIAGNOSIS — I10 Essential (primary) hypertension: Secondary | ICD-10-CM

## 2012-07-17 DIAGNOSIS — Z853 Personal history of malignant neoplasm of breast: Secondary | ICD-10-CM

## 2012-07-17 DIAGNOSIS — F419 Anxiety disorder, unspecified: Secondary | ICD-10-CM

## 2012-07-17 DIAGNOSIS — Z170 Estrogen receptor positive status [ER+]: Secondary | ICD-10-CM

## 2012-07-17 DIAGNOSIS — C50419 Malignant neoplasm of upper-outer quadrant of unspecified female breast: Secondary | ICD-10-CM

## 2012-07-17 MED ORDER — LORAZEPAM 0.5 MG PO TABS: ORAL_TABLET | ORAL | Status: DC

## 2012-07-17 NOTE — Progress Notes (Signed)
ID: Jenna Gonzales   DOB: 12-Aug-1971  MR#: [id number]  NWG#:[id number]  HISTORY OF PRESENT ILLNESS: The patient saw Dr. Cherly Hensen for a routine gynecologic evaluation, and Dr. Cherly Hensen did a breast exam which showed a palpable mass in the right breast.  She was referred for a diagnostic mammography 11/03/2009, and Dr. Beckie Salts was able to confirm a palpable mass in the 10 o'clock position of the right breast 7 cm from the nipple.  There was no palpable adenopathy.  By mammography the breasts were dense, but there was a spiculated mass in the upper outer right breast corresponding to the palpable mass by ultrasound as measured 2.1 cm.  It was hypoechoic, irregular, and poorly defined.  Ultrasound of the right axilla demonstrated several normal sized lymph nodes, several of them showing cortical thickening.    With this information, the patient was brought back on 11/19 for biopsy.  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  The lymph node biopsy obtained at the same time was negative.  The tumor was ER positive at 99%, PR positive at 20% with a low proliferation marker at 12%, and HER-2/neu was not amplified with a ratio of 1.45.  With this information, the patient was referred to Dr. Johna Sheriff, and bilateral breast MRIs were obtained 11/22.  This confirmed a 2.2 cm irregularly marginated enhancing mass in the upper outer right breast with a clip in the mass.  There were no other masses, and aside from post biopsy changes in the right axilla, there were no abnormal appearing lymph nodes.    With this information, the patient proceeded to right lumpectomy and sentinel lymph node sampling 12/09 under Dr. Johna Sheriff because the two sentinel lymph nodes provided positive for cancer.  Dr. Johna Sheriff proceeded to a right axillary lymph node dissection in the same procedure.  The final pathology [id number]) showed an invasive ductal carcinoma, grade  1, with negative although close margins (the closest was 1 mm posteriorly), with evidence of lymphovascular invasion and involving 2 out of 17 lymph nodes sampled (the 2 sentinel lymph nodes).  She was treated with adjuvant chemotherapy consisting of 6 cycles of docetaxel/cyclophosphamide/doxorubicin, completed in May of 2011. This was followed by radiation, after which she began tamoxifen in July of 2011.  INTERVAL HISTORY: Jenna Gonzales returns today for routine followup of her right breast carcinoma. Interval history is remarkable for recent development of hypertension. She was evaluated earlier this week by Dr. Marice Potter, and was started on metoprolol. She took her first dose yesterday. She has a followup appointment with him in a few weeks.  Otherwise interval history is generally unremarkable. Chem continues on tamoxifen with good tolerance.  REVIEW OF SYSTEMS: Jenna Gonzales had no recent illnesses and denies fevers or chills. She has occasional hot flashes. She's had no vaginal bleeding, discharge, or dryness. Her energy level is fair. She's having some difficulty sleeping at night associated with some anxiety and normal life stressors.  She denies nausea or change in bowel habits. She has some postnasal drip and an occasional cough. No shortness of breath and no chest pain. No abnormal headaches or dizziness.  A detailed review of systems is otherwise noncontributory.   PAST MEDICAL HISTORY: Past Medical History  Diagnosis Date  . Cellulitis 11/29/2011  . History of breast cancer   Mild GERD, history of a heart murmur, which was not appreciated today, history of borderline hypertension not under treatment, history of irritable bowel disease, and history  of minimal arthritic changes involving particularly the index finger of the right hand.  PAST SURGICAL HISTORY: Past Surgical History  Procedure Date  . Breast lumpectomy 11/2009    right    FAMILY HISTORY Family History  Problem Relation Age of  Onset  . Leukemia Father   The patient's father died from leukemia at the age of 42 (I cannot tell from the history if this was chronic or acute).  The patient's mother is alive at age 810.  The patient has 2 sisters and 1 brother, all here in [place].  She also has 2 half brothers living in [place].  There is no history of breast or ovarian cancer in the family.  GYNECOLOGIC HISTORY: She is GX, P1.  First pregnancy to term age 80.  Last menstrual period was 12/17.  Her periods are regular, every 3-4 weeks, and last 3 or 4 days, of which the first 2 days are heavy.  SOCIAL HISTORY:  The patient works at [employer] in [facility].  She has worked for the [employer] for 7 years.  She is single.  At home her son Jenna Gonzales, 64 years old, lives with her.  He "could never finish school," and does not have a job at present.  He does have 2 children who live with their mother here in [place], 2 girls, age 81 and 1.  The patient attends [employer].    ADVANCED DIRECTIVES:  HEALTH MAINTENANCE: History  Substance Use Topics  . Smoking status: Never Smoker   . Smokeless tobacco: Not on file  . Alcohol Use: Yes     Colonoscopy: Never  PAP: Dr. Cherly Hensen  Bone density: Never  Lipid panel: Due, Dr. Marice Potter  No Known Allergies  Current Outpatient Prescriptions  Medication Sig Dispense Refill  . metoprolol succinate (TOPROL-XL) 50 MG 24 hr tablet Take 1 tablet (50 mg total) by mouth daily. Take with or immediately following a meal.  90 tablet  3  . tamoxifen (NOLVADEX) 20 MG tablet Take 20 mg by mouth daily.      . fluticasone (FLONASE) 50 MCG/ACT nasal spray 2 sprays each nostril once daily  16 g  2  . LORazepam (ATIVAN) 0.5 MG tablet 1/2 (half) - 1 (one) tab PO QHS PRN  30 tablet  0    OBJECTIVE: Young [place] American female who appears comfortable, and in no acute distress. Filed Vitals:   07/17/12 0900  BP: 139/98  Pulse: 96  Temp: 98.4 F  (36.9 C)     Body mass index is 26.25 kg/(m^2).    ECOG FS: 0  Filed Weights   07/17/12 0900  Weight: 175 lb 3.2 oz (79.47 kg)   Physical Exam: HEENT:  Sclerae anicteric.  Oropharynx clear.     Nodes:  No cervical, supraclavicular, or axillary lymphadenopathy palpated.  Breast Exam:  Right breast is status post lumpectomy with well-healed incision. No suspicious nodularity or skin changes. No evidence of local recurrence. Left breast is benign, with no masses, skin changes, or nipple inversion.  Lungs:  Clear to auscultation bilaterally.  No crackles, rhonchi, or wheezes.   Heart:  Regular rate and rhythm.   Abdomen:  Soft, nontender.  Positive bowel sounds.  No organomegaly or masses palpated.   Musculoskeletal:  No focal spinal tenderness to palpation.  Extremities:  Benign.  No peripheral edema or cyanosis.   Skin:  Benign.   Neuro:  Nonfocal. Alert and oriented x3.  LAB RESULTS: Lab Results  Component Value Date   WBC 6.4 07/16/2012   NEUTROABS 3.8 07/16/2012   HGB 14.0 07/16/2012   HCT 41.5 07/16/2012   MCV 103.2* 07/16/2012   PLT 180 07/16/2012      Chemistry      Component Value Date/Time   NA 140 07/16/2012 1406   K 3.8 07/16/2012 1406   CL 103 07/16/2012 1406   CO2 26 07/16/2012 1406   BUN 15 07/16/2012 1406   CREATININE 0.81 07/16/2012 1406      Component Value Date/Time   CALCIUM 9.8 07/16/2012 1406   ALKPHOS 80 07/16/2012 1406   AST 18 07/16/2012 1406   ALT 19 07/16/2012 1406   BILITOT 0.4 07/16/2012 1406       Lab Results  Component Value Date   LABCA2 32 07/16/2012    STUDIES: Most recent mammogram in late November 2012 was unremarkable.   ASSESSMENT: 41 y.o.  [place] woman   (1)  status post right lumpectomy and axillary lymph node dissection December of 2010 for a T2 N. 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   PLAN: With regards to her breast cancer, Jenna Gonzales is doing extremely well. She will continue on her tamoxifen at 20 mg daily which she is tolerating well. For her insomnia, likely secondary to stress and anxiety, I am starting her on lorazepam, 0.25-0.5 mg at night as needed. She'll continue to followup with Dr. Dareen Piano with regards to her hypertension, and will continue on metoprolol as prescribed through his office.    Otherwise, Jenna Gonzales will be scheduled for her next bilateral mammogram in early December, and return to see [place] in 6 months, January 2014. She knows to call meanwhile any changes or problems.   Kegan Shepardson    07/17/2012

## 2012-07-17 NOTE — Telephone Encounter (Signed)
gve the pt her jan,2014 appt calendar along with the mammo appt

## 2012-07-22 ENCOUNTER — Other Ambulatory Visit: Payer: Self-pay | Admitting: Physician Assistant

## 2012-07-22 DIAGNOSIS — C50919 Malignant neoplasm of unspecified site of unspecified female breast: Secondary | ICD-10-CM

## 2012-07-22 DIAGNOSIS — J329 Chronic sinusitis, unspecified: Secondary | ICD-10-CM

## 2012-07-22 MED ORDER — AMOXICILLIN-POT CLAVULANATE 875-125 MG PO TABS: 1.0000 | ORAL_TABLET | Freq: Two times a day (BID) | ORAL | Status: AC

## 2012-07-22 NOTE — Progress Notes (Signed)
Kim stopped by the clinic today with complaints of sinus congestion, headaches, face pain, and discomfort in the upper teeth. She has a history of sinusitis treated with a Z-Pak approximately 2-3 months ago. She denies any cough, phlegm production, or shortness of breath. Fortunately, she's had no fevers or chills.  I am starting her on a course of Augmentin, one tablet by mouth twice a day for 10 days. I have asked her to contact us, or her primary care physician, this does not take care of the issue within the next week. Certainly she will see her primary care physician sooner if the condition worsens.  Zollie Scale, PA-C 07/22/2012

## 2012-09-28 ENCOUNTER — Other Ambulatory Visit: Payer: Self-pay | Admitting: Oncology

## 2012-09-29 ENCOUNTER — Ambulatory Visit (INDEPENDENT_AMBULATORY_CARE_PROVIDER_SITE_OTHER): Payer: 59 | Admitting: Family Medicine

## 2012-09-29 VITALS — BP 132/90 | HR 112 | Temp 98.8°F | Resp 16 | Ht 68.5 in | Wt 174.0 lb

## 2012-09-29 DIAGNOSIS — J309 Allergic rhinitis, unspecified: Secondary | ICD-10-CM

## 2012-09-29 DIAGNOSIS — J0100 Acute maxillary sinusitis, unspecified: Secondary | ICD-10-CM

## 2012-09-29 MED ORDER — AMOXICILLIN-POT CLAVULANATE 875-125 MG PO TABS: 1.0000 | ORAL_TABLET | Freq: Two times a day (BID) | ORAL | Status: DC

## 2012-09-29 NOTE — Patient Instructions (Addendum)
1. Sinusitis, acute maxillary  amoxicillin-clavulanate (AUGMENTIN) 875-125 MG per tablet  2. Allergic rhinitis       IF CETIRIZINE MAKES YOU DROWSY, THEN TAKE CLARITIN 10MG  DAILY OR ALLEGRA DAILY.

## 2012-09-29 NOTE — Progress Notes (Signed)
[address]   [place], [place]  [address]   [phone]  Subjective:    Patient ID: Jenna Gonzales, female    DOB: 02-Oct-1971, 41 y.o.   MRN: [id number]  HPIThis 41 y.o. female presents for evaluation of sinus congestion, allergies.  Onset one week ago.  +PND, scratchy throat.  No fever but +sweats; no chills.  +mild HA frontal region.  +ear congested L last week.  No ST.  +rhinorrhea clear-yellow.  Excessive drainage.  +congestion nasal.  +teeth pain.  Mild cough; no sputum.  No SOB.  No v/d.  Not taking anything.  +sneezing; +itchy eyes and watering; +itchy nose.  Used Flonase last night for first time.  Has Zyrtec 10mg  but not using.   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     Review of Systems  Constitutional: Positive for fatigue. Negative for fever, chills and diaphoresis.  HENT: Positive for congestion, rhinorrhea, sneezing, dental problem, voice change, postnasal drip and sinus pressure. Negative for ear pain, sore throat, trouble swallowing, neck pain, neck stiffness and ear discharge.   Respiratory: Positive for cough. Negative for shortness of breath and wheezing.   Gastrointestinal: Negative for nausea, vomiting, abdominal pain and diarrhea.  Skin: Negative for rash.        Past Medical History  Diagnosis Date  . Cellulitis 11/29/2011  . History of breast cancer   . Allergy   . Cancer 12/23/2008    Breast Cancer R; s/p lumpectomy with radiation, chemottherapy  . Hypertension     Past Surgical History  Procedure Date  . Breast lumpectomy 11/2009    right; s/p radiation, chemotherapy    Prior to Admission medications   Medication Sig Start Date End Date Taking? Authorizing Provider  fluticasone (FLONASE) 50 MCG/ACT nasal spray 2 sprays each nostril once  daily 04/20/12  Yes Delanna Notice, MD  metoprolol succinate (TOPROL-XL) 50 MG 24 hr tablet Take 1 tablet (50 mg total) by mouth daily. Take with or immediately following a meal. 07/15/12 07/15/13 Yes Phillips Odor, MD  tamoxifen (NOLVADEX) 20 MG tablet Take 20 mg by mouth daily.   Yes Historical Provider, MD  amoxicillin-clavulanate (AUGMENTIN) 875-125 MG per tablet Take 1 tablet by mouth 2 (two) times daily. 09/29/12   Ethelda Chick, MD  LORazepam (ATIVAN) 0.5 MG tablet 1/2 (half) - 1 (one) tab PO QHS PRN 07/17/12   Amy Allegra Grana, PA    No Known Allergies  History   Social History  . Marital Status: Single    Spouse Name: N/A    Number of Children: N/A  . Years of Education: N/A   Occupational History  . Not on file.   Social History Main Topics  . Smoking status: Never Smoker   . Smokeless tobacco: Not on file  . Alcohol Use: Yes  . Drug Use: No  . Sexually Active:    Other Topics Concern  . Not on file   Social History Narrative   Marital status:  Single   Employment: Merchandiser, retail of billing and coding at [employer]    Family History  Problem Relation Age of Onset  . Leukemia Father     Objective:   Physical Exam  Nursing note and vitals reviewed. Constitutional: She is oriented to person, place, and  time. She appears well-developed and well-nourished. No distress.  HENT:  Head: Normocephalic and atraumatic.  Right Ear: External ear normal.  Left Ear: External ear normal.  Nose: Nose normal.  Mouth/Throat: Oropharynx is clear and moist.       +TTP FRONTAL AND MAXILLARY SINUSES.  Eyes: Conjunctivae normal and EOM are normal. Pupils are equal, round, and reactive to light.  Neck: Normal range of motion. Neck supple.  Cardiovascular: Normal rate, regular rhythm and normal heart sounds.   Pulmonary/Chest: Effort normal and breath sounds normal.  Lymphadenopathy:    She has no cervical adenopathy.  Neurological: She is alert and oriented to person,  place, and time.  Skin: No rash noted. She is not diaphoretic.  Psychiatric: She has a normal mood and affect. Her behavior is normal.       Assessment & Plan:   1. Sinusitis, acute maxillary  amoxicillin-clavulanate (AUGMENTIN) 875-125 MG per tablet  2. Allergic rhinitis       1.  Acute Maxillary Sinusitis:  New.  Rx for Augmentin provided. OOW note x 2 days provided. 2.  Allergic Rhinitis: uncontrolled.  Start Flonase daily; start Zyrtec or Claritin 10mg  daily.  Emphasized compliance with daily allergy medication to prevent sinusitis.

## 2012-09-30 NOTE — Progress Notes (Signed)
Reviewed and agree.

## 2012-11-05 ENCOUNTER — Other Ambulatory Visit: Payer: Self-pay | Admitting: Oncology

## 2013-01-04 ENCOUNTER — Ambulatory Visit
Admission: RE | Admit: 2013-01-04 | Discharge: 2013-01-04 | Disposition: A | Discharge status: Home / Self Care | Payer: 59 | Source: Ambulatory Visit | Attending: Physician Assistant | Admitting: Physician Assistant

## 2013-01-04 DIAGNOSIS — Z853 Personal history of malignant neoplasm of breast: Secondary | ICD-10-CM

## 2013-01-19 ENCOUNTER — Ambulatory Visit (HOSPITAL_BASED_OUTPATIENT_CLINIC_OR_DEPARTMENT_OTHER): Payer: 59 | Admitting: Lab

## 2013-01-19 DIAGNOSIS — C50419 Malignant neoplasm of upper-outer quadrant of unspecified female breast: Secondary | ICD-10-CM

## 2013-01-19 DIAGNOSIS — C50919 Malignant neoplasm of unspecified site of unspecified female breast: Secondary | ICD-10-CM

## 2013-01-19 LAB — CBC WITH DIFFERENTIAL/PLATELET
Basophils Absolute: 0 10*3/uL (ref 0.0–0.1)
Eosinophils Absolute: 0 10*3/uL (ref 0.0–0.5)
HGB: 13 g/dL (ref 11.6–15.9)
MCV: 102.1 fL — ABNORMAL HIGH (ref 79.5–101.0)
MONO#: 0.5 10*3/uL (ref 0.1–0.9)
NEUT#: 4.9 10*3/uL (ref 1.5–6.5)
RDW: 12.4 % (ref 11.2–14.5)
lymph#: 2.3 10*3/uL (ref 0.9–3.3)

## 2013-01-19 LAB — COMPREHENSIVE METABOLIC PANEL (CC13)
Albumin: 3.5 g/dL (ref 3.5–5.0)
CO2: 25 mEq/L (ref 22–29)
Chloride: 106 mEq/L (ref 98–107)
Glucose: 139 mg/dl — ABNORMAL HIGH (ref 70–99)
Potassium: 3.6 mEq/L (ref 3.5–5.1)
Sodium: 141 mEq/L (ref 136–145)
Total Protein: 7.4 g/dL (ref 6.4–8.3)

## 2013-01-20 ENCOUNTER — Other Ambulatory Visit: Payer: Self-pay | Admitting: Lab

## 2013-01-21 ENCOUNTER — Ambulatory Visit (HOSPITAL_BASED_OUTPATIENT_CLINIC_OR_DEPARTMENT_OTHER): Payer: 59 | Admitting: Oncology

## 2013-01-21 ENCOUNTER — Telehealth: Payer: Self-pay | Admitting: Oncology

## 2013-01-21 VITALS — BP 135/95 | HR 109 | Temp 98.2°F | Resp 20 | Ht 68.5 in | Wt 180.5 lb

## 2013-01-21 DIAGNOSIS — J3489 Other specified disorders of nose and nasal sinuses: Secondary | ICD-10-CM

## 2013-01-21 DIAGNOSIS — C50419 Malignant neoplasm of upper-outer quadrant of unspecified female breast: Secondary | ICD-10-CM

## 2013-01-21 DIAGNOSIS — C50919 Malignant neoplasm of unspecified site of unspecified female breast: Secondary | ICD-10-CM

## 2013-01-21 DIAGNOSIS — I10 Essential (primary) hypertension: Secondary | ICD-10-CM

## 2013-01-21 DIAGNOSIS — Z170 Estrogen receptor positive status [ER+]: Secondary | ICD-10-CM

## 2013-01-21 NOTE — Telephone Encounter (Signed)
gv pt appt schedule for July.  

## 2013-01-21 NOTE — Progress Notes (Signed)
ID: GINNI EICHLER   DOB: [birth year]  MR#: [id number]  WUJ#:[id number]  HISTORY OF PRESENT ILLNESS: The patient saw Dr. Cherly Hensen for a routine gynecologic evaluation, and Dr. Cherly Hensen did a breast exam which showed a palpable mass in the right breast.  She was referred for a diagnostic mammography 11/03/2009, and Dr. Beckie Salts was able to confirm a palpable mass in the 10 o'clock position of the right breast 7 cm from the nipple.  There was no palpable adenopathy.  By mammography the breasts were dense, but there was a spiculated mass in the upper outer right breast corresponding to the palpable mass by ultrasound as measured 2.1 cm.  It was hypoechoic, irregular, and poorly defined.  Ultrasound of the right axilla demonstrated several normal sized lymph nodes, several of them showing cortical thickening.    With this information, the patient was brought back on 11/19 for biopsy.  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  The lymph node biopsy obtained at the same time was negative.  The tumor was ER positive at 99%, PR positive at 20% with a low proliferation marker at 12%, and HER-2/neu was not amplified with a ratio of 1.45.  Bilateral breast MRIs were obtained 11/22.  This confirmed a 2.2 cm irregularly marginated enhancing mass in the upper outer right breast with a clip in the mass.  There were no other masses, and aside from post biopsy changes in the right axilla, there were no abnormal appearing lymph nodes.    The patient proceeded to right lumpectomy and sentinel lymph node sampling 12/09 under Dr. Johna Sheriff because the two sentinel lymph nodes provided positive for cancer.  Dr. Johna Sheriff proceeded to a right axillary lymph node dissection in the same procedure.  The final pathology [id number]) showed an invasive ductal carcinoma, grade 1, with negative although close margins (the closest was 1 mm posteriorly), with evidence of  lymphovascular invasion and involving 2 out of 17 lymph nodes sampled (the 2 sentinel lymph nodes). Her subsequent history is as detailed below.   INTERVAL HISTORY: Selena Batten returns today for followup of her breast cancer. The interval history is generally unremarkable. She continues to work here, and she has a nephew who is becoming a Psychologist, educational, and he is going to Ecologist" at his gym and so she can get back into a regular exercise program.  REVIEW OF SYSTEMS: Selena Batten has had continued bouts with sinus problems 4 months and months. She has had 2 courses of antibiotics which help only briefly. She takes Zyrtec at times but it makes her sleepy. It turns out she's been using a nasal spray daily 4 weeks, and was not aware that that can cause a rebound problems. This was discussed at length today and she is not having any cough or shortness of breath or pleurisy. She is having some pain in her teeth and she admits to grinding her teeth and clenching her teeth because of stress issues as she sleeps poorly, is moderately fatigued, and is having some hot flashes. She has occasional pains in her left arm and left knee. Otherwise a detailed review of systems today was stable.  PAST MEDICAL HISTORY: Past Medical History  Diagnosis Date  . Cellulitis 11/29/2011  . History of breast cancer   . Allergy   . Cancer 12/23/2008    Breast Cancer R; s/p lumpectomy with radiation, chemottherapy  . Hypertension   Mild GERD, history of a heart murmur, which was not  appreciated today, history of borderline hypertension not under treatment, history of irritable bowel disease, and history of minimal arthritic changes involving particularly the index finger of the right hand.  PAST SURGICAL HISTORY: Past Surgical History  Procedure Date  . Breast lumpectomy 11/2009    right; s/p radiation, chemotherapy    FAMILY HISTORY Family History  Problem Relation Age of Onset  . Leukemia Father   The patient's father died from leukemia  at the age of 31 (I cannot tell from the history if this was chronic or acute).  The patient's mother is alive at age 65.  The patient has 2 sisters and 1 brother, all here in [place].  She also has 2 half brothers living in [place].  There is no history of breast or ovarian cancer in the family.  GYNECOLOGIC HISTORY: She is GX, P1.  First pregnancy to term age 38.  Last menstrual period was 12/17.  Her periods are regular, every 3-4 weeks, and last 3 or 4 days, of which the first 2 days are heavy.  SOCIAL HISTORY:  The patient works at [employer] in [facility].  She has worked for the [employer] for 8 years.  She is single.  At home her son [name], 96 years old, lives with her.  He "could never finish school," and does not have a job at present.  He does have 2 children who live with their mother here in [place], 2 girls, age 43 and 1.  The patient attends [employer].    ADVANCED DIRECTIVES:  HEALTH MAINTENANCE: History  Substance Use Topics  . Smoking status: Never Smoker   . Smokeless tobacco: Not on file  . Alcohol Use: Yes     Colonoscopy: Never  PAP: Dr. Cherly Hensen  Bone density: Never  Lipid panel: Due, Dr. Marice Potter  No Known Allergies  Current Outpatient Prescriptions  Medication Sig Dispense Refill  . amoxicillin-clavulanate (AUGMENTIN) 875-125 MG per tablet Take 1 tablet by mouth 2 (two) times daily.  20 tablet  0  . fluticasone (FLONASE) 50 MCG/ACT nasal spray 2 sprays each nostril once daily  16 g  2  . LORazepam (ATIVAN) 0.5 MG tablet 1/2 (half) - 1 (one) tab PO QHS PRN  30 tablet  0  . metoprolol succinate (TOPROL-XL) 50 MG 24 hr tablet Take 1 tablet (50 mg total) by mouth daily. Take with or immediately following a meal.  90 tablet  3  . tamoxifen (NOLVADEX) 20 MG tablet TAKE 1 TABLET BY MOUTH DAILY  90 tablet  PRN    OBJECTIVE: Young [place] American woman in no acute distress. Filed Vitals:   01/21/13 0857  BP:  135/95  Pulse: 109  Temp: 98.2 F (36.8 C)  Resp: 20     Body mass index is 27.05 kg/(m^2).    ECOG FS: 0  Filed Weights   01/21/13 0857  Weight: 180 lb 8 oz (81.874 kg)   Sclerae unicteric Oropharynx clear No cervical or supraclavicular adenopathy Lungs no rales or rhonchi Heart regular rate and rhythm Abd benign MSK no focal spinal tenderness, no peripheral edema Neuro: nonfocal Breasts: The right breast is status post lumpectomy and radiation. There is no evidence of local recurrence. The right axilla is benign. The left breast is unremarkable.     LAB RESULTS: Lab Results  Component Value Date   WBC 7.8 01/19/2013   NEUTROABS 4.9 01/19/2013   HGB 13.0 01/19/2013   HCT 38.2 01/19/2013  MCV 102.1* 01/19/2013   PLT 187 01/19/2013      Chemistry      Component Value Date/Time   NA 141 01/19/2013 1507   NA 140 07/16/2012 1406   K 3.6 01/19/2013 1507   K 3.8 07/16/2012 1406   CL 106 01/19/2013 1507   CL 103 07/16/2012 1406   CO2 25 01/19/2013 1507   CO2 26 07/16/2012 1406   BUN 14.3 01/19/2013 1507   BUN 15 07/16/2012 1406   CREATININE 1.0 01/19/2013 1507   CREATININE 0.81 07/16/2012 1406      Component Value Date/Time   CALCIUM 9.5 01/19/2013 1507   CALCIUM 9.8 07/16/2012 1406   ALKPHOS 87 01/19/2013 1507   ALKPHOS 80 07/16/2012 1406   AST 20 01/19/2013 1507   AST 18 07/16/2012 1406   ALT 17 01/19/2013 1507   ALT 19 07/16/2012 1406   BILITOT 0.29 01/19/2013 1507   BILITOT 0.4 07/16/2012 1406       Lab Results  Component Value Date   LABCA2 25 01/19/2013    STUDIES: Mm Digital Diagnostic Bilat  01/04/2013  *RADIOLOGY REPORT*  Clinical Data:  The patient underwent right lumpectomy, chemotherapy and radiation therapy for breast cancer in 2010.  DIGITAL DIAGNOSTIC BILATERAL MAMMOGRAM WITH CAD  Comparison: 11/21/2011, 11/19/2010, 11/03/2009  Findings:  ACR Breast Density Category 2: There are scattered fibroglandular densities.  Right lumpectomy changes are present.  There is  no suspicious dominant mass, nonsurgical architectural distortion or calcification to suggest malignancy.  Mammographic images were processed with CAD.  IMPRESSION: No mammographic evidence of malignancy.  RECOMMENDATION: Yearly diagnostic mammography is suggested.  I have discussed the findings and recommendations with the patient. Results were also provided in writing at the conclusion of the visit.  BI-RADS CATEGORY 2:  Benign finding(s).   Original Report Authenticated By: Cain Saupe, M.D.     ASSESSMENT: 42 y.o.  [place] woman   (1)  status post right lumpectomy and axillary lymph node dissection December of 2010 for a T2 N1, stage IIB invasive ductal carcinoma, grade 1, strongly estrogen receptor positive, moderately progesterone receptor positive, HER-2 negative, with a low proliferation fraction;   (2)  treated adjuvantly with 6 cycles of docetaxel/doxorubicin/cyclophosphamide, completed in May of 2011,   (3)   followed by radiation completed July of 2011 at which time she started tamoxifen.   PLAN: She is going to stop using the nasal spray for at least a week, and hopefully that will begin to clear the nasal congestion. If she uses it again in the future she will use it no more than 3 days over a ten-day period. She's also going to switch from Zyrtec to loratadine. Hopefully that will make her less groggy.  As far as breast cancer is concerned there is no evidence of disease recurrence. The plan is to continue tamoxifen for total of 10 years. We're going to add an estradiol level and FSH to the next set of labs, 6 months from now, but last time we checked it she was clearly postmenopausal  Elric Tirado C    01/21/2013

## 2013-01-22 ENCOUNTER — Ambulatory Visit (INDEPENDENT_AMBULATORY_CARE_PROVIDER_SITE_OTHER): Payer: Self-pay | Admitting: General Surgery

## 2013-01-28 ENCOUNTER — Other Ambulatory Visit: Payer: Self-pay | Admitting: Otolaryngology

## 2013-01-28 DIAGNOSIS — J329 Chronic sinusitis, unspecified: Secondary | ICD-10-CM

## 2013-01-28 DIAGNOSIS — R51 Headache: Secondary | ICD-10-CM

## 2013-01-29 ENCOUNTER — Other Ambulatory Visit: Payer: 59

## 2013-04-15 ENCOUNTER — Ambulatory Visit (INDEPENDENT_AMBULATORY_CARE_PROVIDER_SITE_OTHER): Payer: Self-pay | Admitting: General Surgery

## 2013-05-10 ENCOUNTER — Telehealth: Payer: Self-pay | Admitting: *Deleted

## 2013-05-10 NOTE — Telephone Encounter (Signed)
Lm informing the pt that [facility] will be in clinic during the am. gv appt d/t for 07/21/13 @ 2pm. i also made the pt ware that  i will mail her a letter/cal as well...td

## 2013-07-14 ENCOUNTER — Other Ambulatory Visit (HOSPITAL_BASED_OUTPATIENT_CLINIC_OR_DEPARTMENT_OTHER): Payer: Self-pay | Admitting: Lab

## 2013-07-14 DIAGNOSIS — C50419 Malignant neoplasm of upper-outer quadrant of unspecified female breast: Secondary | ICD-10-CM

## 2013-07-14 DIAGNOSIS — C50919 Malignant neoplasm of unspecified site of unspecified female breast: Secondary | ICD-10-CM

## 2013-07-14 LAB — COMPREHENSIVE METABOLIC PANEL (CC13)
ALT: 20 U/L (ref 0–55)
AST: 19 U/L (ref 5–34)
CO2: 25 mEq/L (ref 22–29)
Chloride: 105 mEq/L (ref 98–109)
Sodium: 142 mEq/L (ref 136–145)
Total Bilirubin: 0.39 mg/dL (ref 0.20–1.20)
Total Protein: 7.5 g/dL (ref 6.4–8.3)

## 2013-07-14 LAB — CBC WITH DIFFERENTIAL/PLATELET
BASO%: 0.2 % (ref 0.0–2.0)
Eosinophils Absolute: 0 10*3/uL (ref 0.0–0.5)
MCHC: 33.3 g/dL (ref 31.5–36.0)
MONO#: 0.5 10*3/uL (ref 0.1–0.9)
MONO%: 8.6 % (ref 0.0–14.0)
NEUT#: 3.2 10*3/uL (ref 1.5–6.5)
RBC: 4.03 10*6/uL (ref 3.70–5.45)
RDW: 11.8 % (ref 11.2–14.5)
WBC: 5.6 10*3/uL (ref 3.9–10.3)
nRBC: 0 % (ref 0–0)

## 2013-07-14 LAB — FOLLICLE STIMULATING HORMONE: FSH: 20.7 m[IU]/mL

## 2013-07-21 ENCOUNTER — Telehealth: Payer: Self-pay | Admitting: Oncology

## 2013-07-21 ENCOUNTER — Ambulatory Visit (HOSPITAL_BASED_OUTPATIENT_CLINIC_OR_DEPARTMENT_OTHER): Payer: Self-pay | Admitting: Oncology

## 2013-07-21 ENCOUNTER — Ambulatory Visit: Payer: 59 | Admitting: Oncology

## 2013-07-21 VITALS — BP 147/98 | HR 97 | Temp 98.3°F | Resp 18 | Ht 68.0 in | Wt 186.0 lb

## 2013-07-21 DIAGNOSIS — Z170 Estrogen receptor positive status [ER+]: Secondary | ICD-10-CM

## 2013-07-21 DIAGNOSIS — R51 Headache: Secondary | ICD-10-CM

## 2013-07-21 DIAGNOSIS — C50419 Malignant neoplasm of upper-outer quadrant of unspecified female breast: Secondary | ICD-10-CM

## 2013-07-21 DIAGNOSIS — C50911 Malignant neoplasm of unspecified site of right female breast: Secondary | ICD-10-CM

## 2013-07-21 NOTE — Progress Notes (Signed)
ID: Jenna Gonzales   DOB: 1971-09-13  MR#: [id number]  FAO#:[id number]  PCP: No PCP Per Patient GYN: Maxie Better SU: benjamin Hoxworth OTHER MD:  HISTORY OF PRESENT ILLNESS: The patient saw Dr. Cherly Hensen for a routine gynecologic evaluation, and Dr. Cherly Hensen did a breast exam which showed a palpable mass in the right breast.  She was referred for a diagnostic mammography 11/03/2009, and Dr. Beckie Salts was able to confirm a palpable mass in the 10 o'clock position of the right breast 7 cm from the nipple.  There was no palpable adenopathy.  By mammography the breasts were dense, but there was a spiculated mass in the upper outer right breast corresponding to the palpable mass by ultrasound as measured 2.1 cm.  It was hypoechoic, irregular, and poorly defined.  Ultrasound of the right axilla demonstrated several normal sized lymph nodes, several of them showing cortical thickening.    With this information, the patient was brought back on 11/19 for biopsy.  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  The lymph node biopsy obtained at the same time was negative.  The tumor was ER positive at 99%, PR positive at 20% with a low proliferation marker at 12%, and HER-2/neu was not amplified with a ratio of 1.45.  Bilateral breast MRIs were obtained 11/22.  This confirmed a 2.2 cm irregularly marginated enhancing mass in the upper outer right breast with a clip in the mass.  There were no other masses, and aside from post biopsy changes in the right axilla, there were no abnormal appearing lymph nodes.    The patient proceeded to right lumpectomy and sentinel lymph node sampling 12/09 under Dr. Johna Sheriff because the two sentinel lymph nodes provided positive for cancer.  Dr. Johna Sheriff proceeded to a right axillary lymph node dissection in the same procedure.  The final pathology [id number]) showed an invasive ductal carcinoma, grade 1, with  negative although close margins (the closest was 1 mm posteriorly), with evidence of lymphovascular invasion and involving 2 out of 17 lymph nodes sampled (the 2 sentinel lymph nodes). Her subsequent history is as detailed below.   INTERVAL HISTORY: Jenna Gonzales returns today for followup of her breast cancer. The interval history is significant for her having changed jobs.. She continues on tamoxifen, which she is tolerating well.  REVIEW OF SYSTEMS: Jenna Gonzales has some sinus symptoms and post nasal drip which causes her to cough at times. She is on antihistamines for this. She had a headache today, but this is unusual for her. This is likely related to the sinus symptoms. Otherwise a detailed review of systems today was noncontributory and in particular she does not have significant problems with hot flashes, vaginal dryness or wetness.  PAST MEDICAL HISTORY: Past Medical History  Diagnosis Date  . Cellulitis 11/29/2011  . History of breast cancer   . Allergy   . Cancer 12/23/2008    Breast Cancer R; s/p lumpectomy with radiation, chemottherapy  . Hypertension   Mild GERD, history of a heart murmur, which was not appreciated today, history of borderline hypertension not under treatment, history of irritable bowel disease, and history of minimal arthritic changes involving particularly the index finger of the right hand.  PAST SURGICAL HISTORY: Past Surgical History  Procedure Laterality Date  . Breast lumpectomy  11/2009    right; s/p radiation, chemotherapy    FAMILY HISTORY Family History  Problem Relation Age of Onset  . Leukemia Father   The  patient's father died from leukemia at the age of 39 (I cannot tell from the history if this was chronic or acute).  The patient's mother is alive at age 50.  The patient has 2 sisters and 1 brother, all here in [place].  She also has 2 half brothers living in [place].  There is no history of breast or ovarian cancer in the family.  GYNECOLOGIC  HISTORY: She is GX, P1.  First pregnancy to term age 20.  Last menstrual period was 12/17.  Her periods are regular, every 3-4 weeks, and last 3 or 4 days, of which the first 2 days are heavy.  SOCIAL HISTORY:  The patient worked at [employer] in [facility].  She worked for the [employer] for 8 years.  She is single. Her son Dahrahn lives with her.  He "could never finish school," and does not have a job at present.  He does have 2 daughters who live with their mother here in [place].  The patient attends [employer].    ADVANCED DIRECTIVES:  HEALTH MAINTENANCE: History  Substance Use Topics  . Smoking status: Never Smoker   . Smokeless tobacco: Not on file  . Alcohol Use: Yes     Colonoscopy: Never  PAP: Dr. Cherly Hensen  Bone density: Never  Lipid panel: Due, Dr. Marice Potter  No Known Allergies  Current Outpatient Prescriptions  Medication Sig Dispense Refill  . amoxicillin-clavulanate (AUGMENTIN) 875-125 MG per tablet Take 1 tablet by mouth 2 (two) times daily.  20 tablet  0  . fluticasone (FLONASE) 50 MCG/ACT nasal spray 2 sprays each nostril once daily  16 g  2  . LORazepam (ATIVAN) 0.5 MG tablet 1/2 (half) - 1 (one) tab PO QHS PRN  30 tablet  0  . metoprolol succinate (TOPROL-XL) 50 MG 24 hr tablet Take 1 tablet (50 mg total) by mouth daily. Take with or immediately following a meal.  90 tablet  3  . tamoxifen (NOLVADEX) 20 MG tablet TAKE 1 TABLET BY MOUTH DAILY  90 tablet  PRN   No current facility-administered medications for this visit.    OBJECTIVE: Young African American woman in no acute distress. Filed Vitals:   07/21/13 1415  BP: 147/98  Pulse: 97  Temp: 98.3 F (36.8 C)  Resp: 18     Body mass index is 28.29 kg/(m^2).    ECOG FS: 0  Filed Weights   07/21/13 1415  Weight: 186 lb (84.369 kg)   Sclerae unicteric, pupils equal round and reactive to light Oropharynx clear No cervical or supraclavicular  adenopathy Lungs no rales or rhonchi Heart regular rate and rhythm Abd benign MSK no focal spinal tenderness, no peripheral edema Neuro: nonfocal, well oriented, pleasant affect Breasts: The right breast is status post lumpectomy and radiation. There is no evidence of local recurrence. The right axilla is benign. The left breast is unremarkable.  LAB RESULTS: Lab Results  Component Value Date   WBC 5.6 07/14/2013   NEUTROABS 3.2 07/14/2013   HGB 13.7 07/14/2013   HCT 41.2 07/14/2013   MCV 102.2* 07/14/2013   PLT 166 07/14/2013      Chemistry      Component Value Date/Time   NA 142 07/14/2013 0820   NA 140 07/16/2012 1406   K 3.8 07/14/2013 0820   K 3.8 07/16/2012 1406   CL 106 01/19/2013 1507   CL 103 07/16/2012 1406   CO2 25 07/14/2013 0820   CO2 26  07/16/2012 1406   BUN 11.3 07/14/2013 0820   BUN 15 07/16/2012 1406   CREATININE 0.9 07/14/2013 0820   CREATININE 0.81 07/16/2012 1406      Component Value Date/Time   CALCIUM 9.6 07/14/2013 0820   CALCIUM 9.8 07/16/2012 1406   ALKPHOS 88 07/14/2013 0820   ALKPHOS 80 07/16/2012 1406   AST 19 07/14/2013 0820   AST 18 07/16/2012 1406   ALT 20 07/14/2013 0820   ALT 19 07/16/2012 1406   BILITOT 0.39 07/14/2013 0820   BILITOT 0.4 07/16/2012 1406       Lab Results  Component Value Date   LABCA2 25 01/19/2013    STUDIES: Mm Digital Diagnostic Bilat  01/04/2013  *RADIOLOGY REPORT*  Clinical Data:  The patient underwent right lumpectomy, chemotherapy and radiation therapy for breast cancer in 2010.  DIGITAL DIAGNOSTIC BILATERAL MAMMOGRAM WITH CAD  Comparison: 11/21/2011, 11/19/2010, 11/03/2009  Findings:  ACR Breast Density Category 2: There are scattered fibroglandular densities.  Right lumpectomy changes are present.  There is no suspicious dominant mass, nonsurgical architectural distortion or calcification to suggest malignancy.  Mammographic images were processed with CAD.  IMPRESSION: No mammographic evidence of malignancy.  RECOMMENDATION:  Yearly diagnostic mammography is suggested.  I have discussed the findings and recommendations with the patient. Results were also provided in writing at the conclusion of the visit.  BI-RADS CATEGORY 2:  Benign finding(s).   Original Report Authenticated By: Cain Saupe, M.D.     ASSESSMENT: 43 y.o.  [place] woman   (1)  status post right lumpectomy and axillary lymph node dissection December of 2010 for a T2 N1, stage IIB invasive ductal carcinoma, grade 1, strongly estrogen receptor positive, moderately progesterone receptor positive, HER-2 negative, with a low proliferation fraction;   (2)  treated adjuvantly with 6 cycles of docetaxel/doxorubicin/cyclophosphamide, completed in May of 2011,   (3)   followed by radiation completed July of 2011 at which time she started tamoxifen.   PLAN: She is doing very well from a breast cancer point of view and the overall plan is to continue tamoxifen for 10 years. We are going to start seeing her on a yearly basis in August. She will see Dr. Johna Sheriff in February after her next mammogram and she will catch up with Dr. cousins her gynecologist later this year.  Nikolina knows to call us for any problems that may develop before her next visit here.  [name],GUSTAV C    07/21/2013

## 2013-09-14 ENCOUNTER — Emergency Department (HOSPITAL_COMMUNITY): Payer: BC Managed Care – PPO

## 2013-09-14 ENCOUNTER — Emergency Department (HOSPITAL_COMMUNITY)
Admission: EM | Admit: 2013-09-14 | Discharge: 2013-09-14 | Disposition: A | Discharge status: Home / Self Care | Payer: BC Managed Care – PPO | Attending: Emergency Medicine | Admitting: Emergency Medicine

## 2013-09-14 ENCOUNTER — Encounter (HOSPITAL_COMMUNITY): Payer: Self-pay | Admitting: Emergency Medicine

## 2013-09-14 DIAGNOSIS — I10 Essential (primary) hypertension: Secondary | ICD-10-CM | POA: Insufficient documentation

## 2013-09-14 DIAGNOSIS — X500XXA Overexertion from strenuous movement or load, initial encounter: Secondary | ICD-10-CM | POA: Insufficient documentation

## 2013-09-14 DIAGNOSIS — Z853 Personal history of malignant neoplasm of breast: Secondary | ICD-10-CM | POA: Insufficient documentation

## 2013-09-14 DIAGNOSIS — Y9302 Activity, running: Secondary | ICD-10-CM | POA: Insufficient documentation

## 2013-09-14 DIAGNOSIS — X503XXA Overexertion from repetitive movements, initial encounter: Secondary | ICD-10-CM | POA: Insufficient documentation

## 2013-09-14 DIAGNOSIS — Z872 Personal history of diseases of the skin and subcutaneous tissue: Secondary | ICD-10-CM | POA: Insufficient documentation

## 2013-09-14 DIAGNOSIS — Z79899 Other long term (current) drug therapy: Secondary | ICD-10-CM | POA: Insufficient documentation

## 2013-09-14 DIAGNOSIS — IMO0002 Reserved for concepts with insufficient information to code with codable children: Secondary | ICD-10-CM | POA: Insufficient documentation

## 2013-09-14 DIAGNOSIS — Y9289 Other specified places as the place of occurrence of the external cause: Secondary | ICD-10-CM | POA: Insufficient documentation

## 2013-09-14 DIAGNOSIS — M2391 Unspecified internal derangement of right knee: Secondary | ICD-10-CM

## 2013-09-14 MED ORDER — IBUPROFEN 200 MG PO TABS
600.0000 mg | ORAL_TABLET | Freq: Once | ORAL | Status: AC
  Administered 2013-09-14: 600 mg via ORAL
  Filled 2013-09-14: qty 3

## 2013-09-14 MED ORDER — IBUPROFEN 600 MG PO TABS: 600.0000 mg | ORAL_TABLET | Freq: Three times a day (TID) | ORAL | Status: DC | PRN

## 2013-09-14 MED ORDER — HYDROCODONE-ACETAMINOPHEN 5-325 MG PO TABS: 1.0000 | ORAL_TABLET | Freq: Four times a day (QID) | ORAL | Status: DC | PRN

## 2013-09-14 NOTE — ED Provider Notes (Signed)
CSN: [id number]     Arrival date & time 09/14/13  2034 History  This chart was scribed for non-physician practitioner Earley Favor, NP working with Jenna Kras, MD by Danella Maiers, ED Scribe. This patient was seen in room WTR6/WTR6 and the patient's care was started at 9:34 PM.   Chief Complaint  Patient presents with  . Knee Pain    right   The history is provided by the patient. No language interpreter was used.   HPI Comments: [name] is a 42 y.o. female who presents to the Emergency Department complaining of right knee pain after hearing a pop in her right knee while running. She reports the pain worsens when she bears weight on it. She denies injury anywhere else.  Full rom in the right knee - pe  Past Medical History  Diagnosis Date  . Cellulitis 11/29/2011  . History of breast cancer   . Allergy   . Cancer 12/23/2008    Breast Cancer R; s/p lumpectomy with radiation, chemottherapy  . Hypertension    Past Surgical History  Procedure Laterality Date  . Breast lumpectomy  11/2009    right; s/p radiation, chemotherapy   Family History  Problem Relation Age of Onset  . Leukemia Father    History  Substance Use Topics  . Smoking status: Never Smoker   . Smokeless tobacco: Not on file  . Alcohol Use: Yes     Comment: socially   OB History   Grav Para Term Preterm Abortions TAB SAB Ect Mult Living                 Review of Systems  Musculoskeletal: Positive for joint swelling and arthralgias.  Skin: Negative for color change.  All other systems reviewed and are negative.    Allergies  Review of patient's allergies indicates no known allergies.  Home Medications   Current Outpatient Rx  Name  Route  Sig  Dispense  Refill  . cetirizine (ZYRTEC) 10 MG tablet   Oral   Take 10 mg by mouth daily as needed for allergies.         . metoprolol succinate (TOPROL-XL) 50 MG 24 hr tablet   Oral   Take 50 mg by mouth every evening. Take with or  immediately following a meal.         . tamoxifen (NOLVADEX) 20 MG tablet   Oral   Take 20 mg by mouth daily.         Marland Kitchen HYDROcodone-acetaminophen (NORCO/VICODIN) 5-325 MG per tablet   Oral   Take 1 tablet by mouth every 6 (six) hours as needed for pain.   10 tablet   0   . ibuprofen (ADVIL,MOTRIN) 600 MG tablet   Oral   Take 1 tablet (600 mg total) by mouth every 8 (eight) hours as needed for pain.   30 tablet   0    BP 138/84  Pulse 130  Temp(Src) 100 F (37.8 C) (Oral)  Resp 20  Ht 5\' 8"  (1.727 m)  Wt 177 lb (80.287 kg)  BMI 26.92 kg/m2  SpO2 98% Physical Exam  Nursing note and vitals reviewed. Constitutional: She is oriented to person, place, and time. She appears well-developed and well-nourished. No distress.  HENT:  Head: Normocephalic and atraumatic.  Eyes: Pupils are equal, round, and reactive to light.  Neck: Normal range of motion.  Cardiovascular: Normal rate.   Pulmonary/Chest: Effort normal.  Musculoskeletal: Normal range of motion. She exhibits tenderness.  She exhibits no edema.  Patient is unable to pinpoint exact site of pain while knee is flexed.  The pain is in the medial aspect when leg is extended .  The pain is in the lateral aspect.  There is minimal swelling.  Patient is diffusely tender over the entire anterior knee joint  Neurological: She is alert and oriented to person, place, and time.  Skin: Skin is warm and dry.  Psychiatric: She has a normal mood and affect. Her behavior is normal.    ED Course  Procedures (including critical care time) Medications  ibuprofen (ADVIL,MOTRIN) tablet 600 mg (600 mg Oral Given 09/14/13 2236)    DIAGNOSTIC STUDIES: Oxygen Saturation is 98% on room air, normal by my interpretation.    COORDINATION OF CARE: 10:07 PM- Discussed treatment plan with pt which includes antiinflammatories and a knee immobilizer and pt agrees to plan.    Labs Review Labs Reviewed - No data to display Imaging Review Dg  Knee Complete 4 Views Right  09/14/2013   CLINICAL DATA:  The patient's right knee buckled while exercising. Right knee pain.  EXAM: RIGHT KNEE - COMPLETE 4+ VIEW  COMPARISON:  None.  FINDINGS: There is no evidence of fracture, dislocation, or joint effusion. There is no evidence of arthropathy or other focal bone abnormality. Soft tissues are unremarkable.  IMPRESSION: Negative.   Electronically Signed   By: Drusilla Kanner M.D.   On: 09/14/2013 21:58    MDM   1. Acute internal derangement of right knee     Patient states she felt a pop in her right knee.  While she was running on a treadmill.  She been unable or unwilling to bear weight on her right leg.  Since, then.  She has not taken any medication.  Prior to arrival she, states she just restarted exercising.  Has a history of breast cancer, status post chemotherapy and radiation therapy.  She'll be placed in an immobilizer, given, regular doses of, nonsteroidals.  She'll be given a prescription for Vicodin for severe pain, although she doubts she will use as she's also been given a referral to orthopedics for followup if the knee Is not getting better in 3-5 days   I personally performed the services described in this documentation, which was scribed in my presence. The recorded information has been reviewed and is accurate.  Arman Filter, NP 09/14/13 2253

## 2013-09-14 NOTE — ED Notes (Signed)
NO Blood draw or BP on the right arm

## 2013-09-14 NOTE — ED Notes (Signed)
Transported to xray via wheelchair.

## 2013-09-14 NOTE — ED Notes (Signed)
Pt reports she was working out and had been running when she felt a pop in her R knee. Pt reports she is unable to bear weight on R leg

## 2013-09-14 NOTE — ED Notes (Signed)
Earley Favor notified of pt elevated heart rate at time of discharge

## 2013-09-14 NOTE — ED Provider Notes (Signed)
Medical screening examination/treatment/procedure(s) were performed by non-physician practitioner and as supervising physician I was immediately available for consultation/collaboration.    Celene Kras, MD 09/14/13 [phone]

## 2013-09-22 ENCOUNTER — Encounter (HOSPITAL_COMMUNITY): Payer: Self-pay | Admitting: Cardiology

## 2013-09-22 ENCOUNTER — Inpatient Hospital Stay (HOSPITAL_COMMUNITY)
Admission: EM | Admit: 2013-09-22 | Discharge: 2013-09-23 | DRG: 541 | Disposition: A | Discharge status: Home / Self Care | Payer: BC Managed Care – PPO | Attending: Internal Medicine | Admitting: Internal Medicine

## 2013-09-22 ENCOUNTER — Emergency Department (HOSPITAL_COMMUNITY): Payer: BC Managed Care – PPO

## 2013-09-22 ENCOUNTER — Ambulatory Visit (HOSPITAL_COMMUNITY)
Admission: RE | Admit: 2013-09-22 | Discharge: 2013-09-22 | Disposition: A | Discharge status: Home / Self Care | Payer: BC Managed Care – PPO | Source: Ambulatory Visit | Attending: Specialist | Admitting: Specialist

## 2013-09-22 ENCOUNTER — Other Ambulatory Visit (HOSPITAL_COMMUNITY): Payer: Self-pay | Admitting: Specialist

## 2013-09-22 DIAGNOSIS — Z9010 Acquired absence of unspecified breast and nipple: Secondary | ICD-10-CM

## 2013-09-22 DIAGNOSIS — M79604 Pain in right leg: Secondary | ICD-10-CM

## 2013-09-22 DIAGNOSIS — I2699 Other pulmonary embolism without acute cor pulmonale: Principal | ICD-10-CM | POA: Diagnosis present

## 2013-09-22 DIAGNOSIS — M79609 Pain in unspecified limb: Secondary | ICD-10-CM

## 2013-09-22 DIAGNOSIS — M7989 Other specified soft tissue disorders: Secondary | ICD-10-CM

## 2013-09-22 DIAGNOSIS — I82401 Acute embolism and thrombosis of unspecified deep veins of right lower extremity: Secondary | ICD-10-CM

## 2013-09-22 DIAGNOSIS — J309 Allergic rhinitis, unspecified: Secondary | ICD-10-CM

## 2013-09-22 DIAGNOSIS — C50411 Malignant neoplasm of upper-outer quadrant of right female breast: Secondary | ICD-10-CM | POA: Diagnosis present

## 2013-09-22 DIAGNOSIS — L0390 Cellulitis, unspecified: Secondary | ICD-10-CM

## 2013-09-22 DIAGNOSIS — Z923 Personal history of irradiation: Secondary | ICD-10-CM

## 2013-09-22 DIAGNOSIS — I10 Essential (primary) hypertension: Secondary | ICD-10-CM | POA: Diagnosis present

## 2013-09-22 DIAGNOSIS — K219 Gastro-esophageal reflux disease without esophagitis: Secondary | ICD-10-CM

## 2013-09-22 DIAGNOSIS — Z79899 Other long term (current) drug therapy: Secondary | ICD-10-CM

## 2013-09-22 DIAGNOSIS — C50919 Malignant neoplasm of unspecified site of unspecified female breast: Secondary | ICD-10-CM

## 2013-09-22 DIAGNOSIS — I82409 Acute embolism and thrombosis of unspecified deep veins of unspecified lower extremity: Secondary | ICD-10-CM | POA: Diagnosis present

## 2013-09-22 DIAGNOSIS — Z9221 Personal history of antineoplastic chemotherapy: Secondary | ICD-10-CM

## 2013-09-22 LAB — POCT I-STAT, CHEM 8
Calcium, Ion: 1.18 mmol/L (ref 1.12–1.23)
Glucose, Bld: 106 mg/dL — ABNORMAL HIGH (ref 70–99)
HCT: 42 % (ref 36.0–46.0)
Hemoglobin: 14.3 g/dL (ref 12.0–15.0)
TCO2: 25 mmol/L (ref 0–100)

## 2013-09-22 LAB — CBC
HCT: 39.8 % (ref 36.0–46.0)
MCH: 35.4 pg — ABNORMAL HIGH (ref 26.0–34.0)
MCHC: 34.9 g/dL (ref 30.0–36.0)
MCV: 101.3 fL — ABNORMAL HIGH (ref 78.0–100.0)
Platelets: 178 10*3/uL (ref 150–400)
RDW: 12 % (ref 11.5–15.5)

## 2013-09-22 MED ORDER — ONDANSETRON HCL 4 MG/2ML IJ SOLN: 4.0000 mg | Freq: Three times a day (TID) | INTRAMUSCULAR | Status: DC | PRN

## 2013-09-22 MED ORDER — ACETAMINOPHEN 650 MG RE SUPP: 650.0000 mg | Freq: Four times a day (QID) | RECTAL | Status: DC | PRN

## 2013-09-22 MED ORDER — SODIUM CHLORIDE 0.9 % IJ SOLN
3.0000 mL | Freq: Two times a day (BID) | INTRAMUSCULAR | Status: DC
  Administered 2013-09-22: 3 mL via INTRAVENOUS

## 2013-09-22 MED ORDER — ENOXAPARIN SODIUM 120 MG/0.8ML ~~LOC~~ SOLN
120.0000 mg | SUBCUTANEOUS | Status: DC
  Filled 2013-09-22: qty 0.8

## 2013-09-22 MED ORDER — ACETAMINOPHEN 325 MG PO TABS: 650.0000 mg | ORAL_TABLET | Freq: Four times a day (QID) | ORAL | Status: DC | PRN

## 2013-09-22 MED ORDER — LORATADINE 10 MG PO TABS
10.0000 mg | ORAL_TABLET | Freq: Every day | ORAL | Status: DC
  Administered 2013-09-23: 10 mg via ORAL
  Filled 2013-09-22: qty 1

## 2013-09-22 MED ORDER — ONDANSETRON HCL 4 MG/2ML IJ SOLN: 4.0000 mg | Freq: Four times a day (QID) | INTRAMUSCULAR | Status: DC | PRN

## 2013-09-22 MED ORDER — IOHEXOL 350 MG/ML SOLN
100.0000 mL | Freq: Once | INTRAVENOUS | Status: AC | PRN
  Administered 2013-09-22: 100 mL via INTRAVENOUS

## 2013-09-22 MED ORDER — ONDANSETRON HCL 4 MG PO TABS: 4.0000 mg | ORAL_TABLET | Freq: Four times a day (QID) | ORAL | Status: DC | PRN

## 2013-09-22 MED ORDER — METOPROLOL SUCCINATE ER 50 MG PO TB24
50.0000 mg | ORAL_TABLET | Freq: Every evening | ORAL | Status: DC
  Administered 2013-09-22: 50 mg via ORAL
  Filled 2013-09-22 (×2): qty 1

## 2013-09-22 MED ORDER — LORAZEPAM 1 MG PO TABS
1.0000 mg | ORAL_TABLET | Freq: Once | ORAL | Status: AC
  Administered 2013-09-22: 1 mg via ORAL
  Filled 2013-09-22: qty 1

## 2013-09-22 MED ORDER — SODIUM CHLORIDE 0.9 % IV SOLN
INTRAVENOUS | Status: AC
  Administered 2013-09-22: 21:00:00 via INTRAVENOUS

## 2013-09-22 MED ORDER — SODIUM CHLORIDE 0.9 % IV SOLN
INTRAVENOUS | Status: DC
  Administered 2013-09-22: 22:00:00 via INTRAVENOUS

## 2013-09-22 MED ORDER — ENOXAPARIN SODIUM 120 MG/0.8ML ~~LOC~~ SOLN
120.0000 mg | Freq: Once | SUBCUTANEOUS | Status: AC
Start: 2013-09-22 — End: 2013-09-22
  Administered 2013-09-22: 120 mg via SUBCUTANEOUS
  Filled 2013-09-22: qty 0.8

## 2013-09-22 NOTE — H&P (Signed)
[facility] History and Physical  Jenna Gonzales JXB:[id number] DOB: 1971/05/11 DOA: 09/22/2013  Referring physician: ER physician. PCP: No PCP Per Patient  Specialists: Dr. Lorita Officer. Oncologist.  Chief Complaint: Right leg pain.  HPI: Jenna Gonzales is a 42 y.o. female with history of breast cancer status post lumpectomy radiation and chemotherapy in 2010/11 presently on tamoxifen started experiencing some pain in her right knee last week while exercising. Following which she had gone to the ER and had brace placed and was referred to the orthopedics. Patient had gone to the orthopedic consult today and other than patient stated that she's been having crampy pain of the calf muscle for last few days. Doppler of the lower study was done which showed extensive DVT. In the ER patient had a CT angiogram of the chest which also showed pulmonary embolism. Patient has been started on Lovenox and admitted for further management. Patient denies any chest pain or shortness of breath nausea vomiting abdominal pain fever chills. Patient is tachycardic but blood pressure is stable. Patient was given one dose of Ativan for possible anxiety.  Review of Systems: As presented in the history of presenting illness, rest negative.  Past Medical History  Diagnosis Date  . Cellulitis 11/29/2011  . History of breast cancer   . Allergy   . Cancer 12/23/2008    Breast Cancer R; s/p lumpectomy with radiation, chemottherapy  . Hypertension    Past Surgical History  Procedure Laterality Date  . Breast lumpectomy  11/2009    right; s/p radiation, chemotherapy   Social History:  reports that she has never smoked. She does not have any smokeless tobacco history on file. She reports that  drinks alcohol. She reports that she does not use illicit drugs. Home. where does patient live-- Can do ADLs. Can patient participate in ADLs?  No Known Allergies  Family History  Problem Relation Age of  Onset  . Leukemia Father       Prior to Admission medications   Medication Sig Start Date End Date Taking? Authorizing Provider  cetirizine (ZYRTEC) 10 MG tablet Take 10 mg by mouth daily as needed for allergies.   Yes Historical Provider, MD  metoprolol succinate (TOPROL-XL) 50 MG 24 hr tablet Take 50 mg by mouth every evening.    Yes Historical Provider, MD  tamoxifen (NOLVADEX) 20 MG tablet Take 20 mg by mouth daily.   Yes Historical Provider, MD   Physical Exam: Filed Vitals:   09/22/13 1830 09/22/13 2000 09/22/13 2045 09/22/13 2100  BP: 126/91 132/94 132/88 128/90  Pulse: 97 115 106 119  Temp:      TempSrc:      Resp: 18 18 15 23   Height:      Weight:      SpO2: 97% 100% 98% 99%     General:  Well-developed and nourished.  Eyes: Anicteric no pallor.  ENT: No discharge from the ears eyes nose mouth.  Neck: No mass felt.  Cardiovascular: S1-S2 heard.  Respiratory: No rhonchi or crepitations.  Abdomen: Soft nontender bowel sounds present.  Skin: No rash.  Musculoskeletal: Mild swelling of the right knee.  Psychiatric: Appears normal.  Neurologic: Alert awake oriented to time place and person. Moves all extremities.  Labs on Admission:  Basic Metabolic Panel:  Recent Labs Lab 09/22/13 1907  NA 142  K 3.7  CL 106  GLUCOSE 106*  BUN 13  CREATININE 0.90   Liver Function Tests: No results found for this basename: AST,  ALT, ALKPHOS, BILITOT, PROT, ALBUMIN,  in the last 168 hours No results found for this basename: LIPASE, AMYLASE,  in the last 168 hours No results found for this basename: AMMONIA,  in the last 168 hours CBC:  Recent Labs Lab 09/22/13 1900 09/22/13 1907  WBC 10.4  --   HGB 13.9 14.3  HCT 39.8 42.0  MCV 101.3*  --   PLT 178  --    Cardiac Enzymes: No results found for this basename: CKTOTAL, CKMB, CKMBINDEX, TROPONINI,  in the last 168 hours  BNP (last 3 results) No results found for this basename: PROBNP,  in the last 8760  hours CBG: No results found for this basename: GLUCAP,  in the last 168 hours  Radiological Exams on Admission: Ct Angio Chest Pe W/cm &/or Wo Cm  09/22/2013   CLINICAL DATA:  Deep venous thrombosis ; history of breast carcinoma  EXAM: CT ANGIOGRAPHY CHEST WITH CONTRAST  TECHNIQUE: Multidetector CT imaging of the chest was performed using the standard protocol during bolus administration of intravenous contrast. Multiplanar CT image reconstructions including MIPs were obtained to evaluate the vascular anatomy.  CONTRAST:  OMNIPAQUE IOHEXOL 350 MG/ML SOLN  COMPARISON:  Chest radiograph January 10, 2010 and chest CT January 03, 2010  FINDINGS: There is a focal pulmonary embolus in the posterior segment right lower lobe pulmonary artery branch. No other pulmonary emboli are appreciable. There is no thoracic aortic aneurysm or dissection.  Lungs are clear. There is no demonstrable pulmonary embolus. Pericardium is not thickened.  Visualized upper abdominal structures appear normal. There are no blastic or lytic bone lesions.  Review of the MIP images confirms the above findings.  IMPRESSION: There is a focal pulmonary embolus in the posterior segment right lower lobe pulmonary artery branch. No larger pulmonary emboli identified. No edema or consolidation.  Critical Value/emergent results were called by telephone at the time of interpretation on 09/22/2013 at 8:12 PM to [facility] , who verbally acknowledged these results.   Electronically Signed   By: Bretta Bang   On: 09/22/2013 20:13     Assessment/Plan Principal Problem:   PE (pulmonary embolism) Active Problems:   Breast cancer   DVT (deep venous thrombosis)   1. Pulmonary embolism and DVT - patient has been placed on Lovenox per pharmacy. Presently I'm holding off tamoxifen. Please discuss with patient's oncologist in a.m. with regarding to tamoxifen and ideal anticoagulants. 2. History of breast cancer - see #1. 3. Possible  right knee sprain - will need orthopedic followup. 4. Hypertension - continue metoprolol.  Patient follows with [facility] and if urgent issues otherwise patient does not have a regular PCP.  Code Status: Full code.  Family Communication: None.  Disposition Plan: Admit to inpatient.    KAKRAKANDY,ARSHAD N. [facility] Pager [phone].  If 7PM-7AM, please contact night-coverage [email or web address] Password [email or web address] 09/22/2013, 9:15 PM

## 2013-09-22 NOTE — Progress Notes (Signed)
*  Preliminary Results* Right lower extremity venous duplex completed. Right lower extremity is positive for deep vein thrombosis involving the right femoral, popliteal, posterior tibial, and peroneal veins. There is no evidence of right Baker's cyst.  Preliminary results discussed with Georgena Spurling, PA.  09/22/2013 4:35 PM  Gertie Fey, RVT, RDCS, RDMS

## 2013-09-22 NOTE — ED Provider Notes (Signed)
CSN: [id number]     Arrival date & time 09/22/13  1648 History   First MD Initiated Contact with Patient 09/22/13 1707     Chief Complaint  Patient presents with  . DVT   (Consider location/radiation/quality/duration/timing/severity/associated sxs/prior Treatment) The history is provided by the patient and medical records. No language interpreter was used.    Jenna Gonzales is a 42 y.o. female  with a hx of breast cancer (2010 - lumpectomy and chemotherapy and currently on tamoxifen), HTN presents to the Emergency Department complaining of gradual, intermittent, progressively worsening right calf pain onset 4 days ago. She described the pain as crampy, intermittent and associated with mild swelling of the right leg (ankle, calf and knee).  Elevation of the leg makes the pain worse and is relieved with ice packs and ibuprofen.  1 week ago she incurred a right knee strain while exercising and has been using the RICE protocol since that time.  She reports she has been immobile since the injury due to pain and attempting to "rest."  Today, pt referred by Dr Jillyn Hidden her orthopedist after an US showed a DVT.   Pt denies fever, chills, chest pain, SOB, abd pain, N/V/D, weakness, dizziness, syncope, dysuria, hematuria.       Past Medical History  Diagnosis Date  . Cellulitis 11/29/2011  . History of breast cancer   . Allergy   . Cancer 12/23/2008    Breast Cancer R; s/p lumpectomy with radiation, chemottherapy  . Hypertension    Past Surgical History  Procedure Laterality Date  . Breast lumpectomy  11/2009    right; s/p radiation, chemotherapy   Family History  Problem Relation Age of Onset  . Leukemia Father    History  Substance Use Topics  . Smoking status: Never Smoker   . Smokeless tobacco: Not on file  . Alcohol Use: Yes     Comment: socially   OB History   Grav Para Term Preterm Abortions TAB SAB Ect Mult Living                 Review of Systems  Constitutional: Negative  for fever, diaphoresis, appetite change, fatigue and unexpected weight change.  HENT: Negative for mouth sores and neck stiffness.   Eyes: Negative for visual disturbance.  Respiratory: Negative for cough, chest tightness, shortness of breath and wheezing.   Cardiovascular: Negative for chest pain.  Gastrointestinal: Negative for nausea, vomiting, abdominal pain, diarrhea and constipation.  Endocrine: Negative for polydipsia, polyphagia and polyuria.  Genitourinary: Negative for dysuria, urgency, frequency and hematuria.  Musculoskeletal: Positive for myalgias, joint swelling and arthralgias. Negative for back pain.  Skin: Negative for rash.  Allergic/Immunologic: Negative for immunocompromised state.  Neurological: Negative for syncope, light-headedness and headaches.  Hematological: Does not bruise/bleed easily.  Psychiatric/Behavioral: Negative for sleep disturbance. The patient is not nervous/anxious.     Allergies  Review of patient's allergies indicates no known allergies.  Home Medications   No current outpatient prescriptions on file. BP 143/93  Pulse 106  Temp(Src) 98.7 F (37.1 C) (Oral)  Resp 23  Ht 5\' 8"  (1.727 m)  Wt 183 lb 13.8 oz (83.4 kg)  BMI 27.96 kg/m2  SpO2 96% Physical Exam  Nursing note and vitals reviewed. Constitutional: She is oriented to person, place, and time. She appears well-developed and well-nourished. No distress.  Awake, alert, nontoxic appearance  HENT:  Head: Normocephalic and atraumatic.  Mouth/Throat: Oropharynx is clear and moist. No oropharyngeal exudate.  Eyes: Conjunctivae are normal.  No scleral icterus.  Neck: Normal range of motion. Neck supple.  Cardiovascular: Regular rhythm, normal heart sounds and intact distal pulses.   No murmur heard. Pulmonary/Chest: Effort normal and breath sounds normal. No respiratory distress. She has no wheezes.  Abdominal: Soft. Bowel sounds are normal. She exhibits no mass. There is no tenderness.  There is no rebound and no guarding.  Musculoskeletal: Normal range of motion. She exhibits no edema.  Negative Homan's sign No pain to palpation of the calf  Neurological: She is alert and oriented to person, place, and time. She exhibits normal muscle tone. Coordination normal.  Speech is clear and goal oriented Moves extremities without ataxia  Skin: Skin is warm and dry. She is not diaphoretic.  Psychiatric: She has a normal mood and affect.    ED Course  Procedures (including critical care time) Labs Review Labs Reviewed  CBC - Abnormal; Notable for the following:    MCV 101.3 (*)    MCH 35.4 (*)    All other components within normal limits  POCT I-STAT, CHEM 8 - Abnormal; Notable for the following:    Glucose, Bld 106 (*)    All other components within normal limits  PREGNANCY, URINE  TROPONIN I  BASIC METABOLIC PANEL  CBC   Imaging Review Ct Angio Chest Pe W/cm &/or Wo Cm  09/22/2013   CLINICAL DATA:  Deep venous thrombosis ; history of breast carcinoma  EXAM: CT ANGIOGRAPHY CHEST WITH CONTRAST  TECHNIQUE: Multidetector CT imaging of the chest was performed using the standard protocol during bolus administration of intravenous contrast. Multiplanar CT image reconstructions including MIPs were obtained to evaluate the vascular anatomy.  CONTRAST:  OMNIPAQUE IOHEXOL 350 MG/ML SOLN  COMPARISON:  Chest radiograph January 10, 2010 and chest CT January 03, 2010  FINDINGS: There is a focal pulmonary embolus in the posterior segment right lower lobe pulmonary artery branch. No other pulmonary emboli are appreciable. There is no thoracic aortic aneurysm or dissection.  Lungs are clear. There is no demonstrable pulmonary embolus. Pericardium is not thickened.  Visualized upper abdominal structures appear normal. There are no blastic or lytic bone lesions.  Review of the MIP images confirms the above findings.  IMPRESSION: There is a focal pulmonary embolus in the posterior segment  right lower lobe pulmonary artery branch. No larger pulmonary emboli identified. No edema or consolidation.  Critical Value/emergent results were called by telephone at the time of interpretation on 09/22/2013 at 8:12 PM to [facility] , who verbally acknowledged these results.   Electronically Signed   By: Bretta Bang   On: 09/22/2013 20:13   ECG:   Date: 09/22/2013  Rate: 112  Rhythm: sinus tachycardia  QRS Axis: normal  Intervals: normal  ST/T Wave abnormalities: nonspecific ST changes  Conduction Disutrbances:none  Narrative Interpretation: T wave flattening in lead III and aVF; no old for comparison.    Old EKG Reviewed: none available    MDM   1. DVT (deep venous thrombosis), right   2. PE (pulmonary embolism)      Vilinda Boehringer presents with outpatient dx of DVT.  Venous Duplex with Findings consistent with deep vein thrombosis involving the right femoral vein, right popliteal vein, right posterial tibial vein, and right peroneal vein.   Patient venous duplex positive for DVT. Patient denies chest pain or shortness of breath but is tachycardic to 120 here in the emergency department. His duplex shows extensive DVT of the femoral vein. Concern for possible PE  we'll obtain CT angiogram.   CT angiogram positive for PE. Focal pulmonary embolus in the posterior segment right lower lobe.  I personally reviewed the imaging tests through PACS system.  I reviewed available ER/hospitalization records through the EMR.  Patient will be begun on Lovenox and admitted for overnight observation.  No PCP.  Dr. Darnelle Catalan manages her breast cancer.  We'll proceed with admission.  Patient discussed with and evaluated by Dr. Silas Sacramento who agrees with the plan to admit.      Dahlia Client Jaquisha Frech, PA-C 09/22/13 2018  Dierdre Forth, PA-C 09/23/13 0201

## 2013-09-22 NOTE — ED Notes (Signed)
Pt reports right leg pain that started about 2 days. Pt reports she went for a DVT study today and was positive for one on the right leg from the groin to the ankle. Denies any other symptoms at this time.

## 2013-09-22 NOTE — Progress Notes (Addendum)
ANTICOAGULATION CONSULT NOTE - Initial Consult  Pharmacy Consult for Enoxaparin (Lovenox) Indication: pulmonary embolus and DVT  No Known Allergies  Patient Measurements: Height: 5\' 8"  (172.7 cm) Weight: 183 lb 13.8 oz (83.4 kg) IBW/kg (Calculated) : 63.9 Lovenox Dosing Weight: 83.4 kg (TBW)  Vital Signs: Temp: 98.7 F (37.1 C) (10/01 2149) Temp src: Oral (10/01 2149) BP: 143/93 mmHg (10/01 2149) Pulse Rate: 111 (10/01 2149)  Labs:  Recent Labs  09/22/13 1900 09/22/13 1907  HGB 13.9 14.3  HCT 39.8 42.0  PLT 178  --   CREATININE  --  0.90    Estimated Creatinine Clearance: 90.3 ml/min (by C-G formula based on Cr of 0.9).   Medical History: Past Medical History  Diagnosis Date  . Cellulitis 11/29/2011  . History of breast cancer   . Allergy   . Cancer 12/23/2008    Breast Cancer R; s/p lumpectomy with radiation, chemottherapy  . Hypertension     Medications:  Prescriptions prior to admission  Medication Sig Dispense Refill  . cetirizine (ZYRTEC) 10 MG tablet Take 10 mg by mouth daily as needed for allergies.      . metoprolol succinate (TOPROL-XL) 50 MG 24 hr tablet Take 50 mg by mouth every evening.       . tamoxifen (NOLVADEX) 20 MG tablet Take 20 mg by mouth daily.       Scheduled:  . sodium chloride   Intravenous STAT  . [START ON 09/23/2013] loratadine  10 mg Oral Daily  . metoprolol succinate  50 mg Oral QPM  . sodium chloride  3 mL Intravenous Q12H    Assessment: 42 y.o female with acute DVT/PE.  She was given lovenox 120 mg SQ x1 @ 20:49 in the ED (dose = ~1.5 mg/kg).  Platelets =178k, H/H 14.3/42.  No bleeding reported.  She has a history of breast cancer (12/23/2008) s/p lumpectomy with radiation, chemotherapy. SCr 0.9, CrCl ~ 90 ml/min  Goal of Therapy:  Anti-Xa level 0.6-1.2 units/ml 4hrs after LMWH dose given Monitor platelets by anticoagulation protocol: Yes    Plan:  Lovenox 120 mg SQ q24h (1.5mg /kg q24h) Monitor CBC q72 hrs.   Noah Delaine, RPh Clinical Pharmacist Pager: [phone] 09/22/2013,9:58 PM

## 2013-09-23 ENCOUNTER — Other Ambulatory Visit: Payer: Self-pay | Admitting: Oncology

## 2013-09-23 DIAGNOSIS — C50919 Malignant neoplasm of unspecified site of unspecified female breast: Secondary | ICD-10-CM

## 2013-09-23 DIAGNOSIS — D689 Coagulation defect, unspecified: Secondary | ICD-10-CM

## 2013-09-23 LAB — BASIC METABOLIC PANEL
BUN: 10 mg/dL (ref 6–23)
Calcium: 9.2 mg/dL (ref 8.4–10.5)
Chloride: 104 mEq/L (ref 96–112)
Creatinine, Ser: 0.81 mg/dL (ref 0.50–1.10)
GFR calc Af Amer: >90 mL/min (ref >90)
GFR calc non Af Amer: 87 mL/min — ABNORMAL LOW (ref >90)
Glucose, Bld: 106 mg/dL — ABNORMAL HIGH (ref 70–99)

## 2013-09-23 LAB — TROPONIN I: Troponin I: <0.3 ng/mL (ref <0.30)

## 2013-09-23 LAB — PREGNANCY, URINE: Preg Test, Ur: NEGATIVE

## 2013-09-23 LAB — CBC
HCT: 36.8 % (ref 36.0–46.0)
Hemoglobin: 12.6 g/dL (ref 12.0–15.0)
MCH: 34.4 pg — ABNORMAL HIGH (ref 26.0–34.0)
MCHC: 34.2 g/dL (ref 30.0–36.0)
RDW: 12 % (ref 11.5–15.5)

## 2013-09-23 MED ORDER — RIVAROXABAN 20 MG PO TABS: 20.0000 mg | ORAL_TABLET | Freq: Every day | ORAL | Status: DC

## 2013-09-23 MED ORDER — LORAZEPAM 0.5 MG PO TABS: 0.5000 mg | ORAL_TABLET | Freq: Four times a day (QID) | ORAL | Status: DC | PRN

## 2013-09-23 MED ORDER — RIVAROXABAN 15 MG PO TABS: 15.0000 mg | ORAL_TABLET | Freq: Two times a day (BID) | ORAL | Status: DC

## 2013-09-23 MED ORDER — POLYVINYL ALCOHOL 1.4 % OP SOLN
2.0000 [drp] | OPHTHALMIC | Status: DC | PRN
  Filled 2013-09-23: qty 15

## 2013-09-23 MED ORDER — RIVAROXABAN 15 MG PO TABS
15.0000 mg | ORAL_TABLET | Freq: Two times a day (BID) | ORAL | Status: DC
  Administered 2013-09-23: 15 mg via ORAL
  Filled 2013-09-23 (×2): qty 1

## 2013-09-23 NOTE — Progress Notes (Signed)
Utilization Review Completed.Katheline Brendlinger T10/01/2013  

## 2013-09-23 NOTE — ED Provider Notes (Signed)
Medical screening examination/treatment/procedure(s) were conducted as a shared visit with non-physician practitioner(s) and myself.  I personally evaluated the patient during the encounter  Hx breast cancer with R leg pain, found to have extensive DVT as outpatient. Mild tachycardia, denies SOB or CP. R calf swelling and pain, intact DP and PT pulses, no temperature or color changes.  Glynn Octave, MD 09/23/13 1137

## 2013-09-23 NOTE — Progress Notes (Signed)
[facility] PROGRESS NOTE  Jenna Gonzales WUJ:[id number] DOB: Feb 20, 1971 DOA: 09/22/2013 PCP: No PCP Per Patient  Assessment/Plan: Pulmonary embolism and DVT - patient has been placed on Lovenox per pharmacy. holding tamoxifen.  -lovenox/coumadin vs xarelto- will see what is more cost effective - discussed with Dr. Arlice Colt  History of breast cancer - see #1.   Possible right knee sprain - will need orthopedic followup.   Hypertension - continue metoprolol   Code Status: full Family Communication: patient Disposition Plan: d/c soon   Consultants:  Heme-onc via phone  Procedures:    Antibiotics:    HPI/Subjective: Feeling good No SOB -wants to go home soon  Objective: Filed Vitals:   09/23/13 0443  BP: 122/85  Pulse: 93  Temp: 98.4 F (36.9 C)  Resp: 20    Intake/Output Summary (Last 24 hours) at 09/23/13 1047 Last data filed at 09/23/13 0900  Gross per 24 hour  Intake    685 ml  Output    801 ml  Net   -116 ml   Filed Weights   09/22/13 1658 09/22/13 2149 09/23/13 0443  Weight: 80.287 kg (177 lb) 83.4 kg (183 lb 13.8 oz) 83.008 kg (183 lb)    Exam:   General:  A+Ox3, NAd  Cardiovascular: rrr  Respiratory: clear anterior  Abdomen: +BS, soft, NT  Musculoskeletal: moves all 4 ext  Data Reviewed: Basic Metabolic Panel:  Recent Labs Lab 09/22/13 1907 09/23/13 0640  NA 142 139  K 3.7 3.6  CL 106 104  CO2  --  24  GLUCOSE 106* 106*  BUN 13 10  CREATININE 0.90 0.81  CALCIUM  --  9.2   Liver Function Tests: No results found for this basename: AST, ALT, ALKPHOS, BILITOT, PROT, ALBUMIN,  in the last 168 hours No results found for this basename: LIPASE, AMYLASE,  in the last 168 hours No results found for this basename: AMMONIA,  in the last 168 hours CBC:  Recent Labs Lab 09/22/13 1900 09/22/13 1907 09/23/13 0640  WBC 10.4  --  5.7  HGB 13.9 14.3 12.6  HCT 39.8 42.0 36.8  MCV 101.3*  --  100.5*  PLT 178  --  155    Cardiac Enzymes:  Recent Labs Lab 09/23/13 0015  TROPONINI <0.30   BNP (last 3 results) No results found for this basename: PROBNP,  in the last 8760 hours CBG: No results found for this basename: GLUCAP,  in the last 168 hours  No results found for this or any previous visit (from the past 240 hour(s)).   Studies: Ct Angio Chest Pe W/cm &/or Wo Cm  09/22/2013   CLINICAL DATA:  Deep venous thrombosis ; history of breast carcinoma  EXAM: CT ANGIOGRAPHY CHEST WITH CONTRAST  TECHNIQUE: Multidetector CT imaging of the chest was performed using the standard protocol during bolus administration of intravenous contrast. Multiplanar CT image reconstructions including MIPs were obtained to evaluate the vascular anatomy.  CONTRAST:  OMNIPAQUE IOHEXOL 350 MG/ML SOLN  COMPARISON:  Chest radiograph January 10, 2010 and chest CT January 03, 2010  FINDINGS: There is a focal pulmonary embolus in the posterior segment right lower lobe pulmonary artery branch. No other pulmonary emboli are appreciable. There is no thoracic aortic aneurysm or dissection.  Lungs are clear. There is no demonstrable pulmonary embolus. Pericardium is not thickened.  Visualized upper abdominal structures appear normal. There are no blastic or lytic bone lesions.  Review of the MIP images confirms the above findings.  IMPRESSION: There is a focal pulmonary embolus in the posterior segment right lower lobe pulmonary artery branch. No larger pulmonary emboli identified. No edema or consolidation.  Critical Value/emergent results were called by telephone at the time of interpretation on 09/22/2013 at 8:12 PM to [facility] , who verbally acknowledged these results.   Electronically Signed   By: Bretta Bang   On: 09/22/2013 20:13    Scheduled Meds: . enoxaparin (LOVENOX) injection  120 mg Subcutaneous Q24H  . loratadine  10 mg Oral Daily  . metoprolol succinate  50 mg Oral QPM  . sodium chloride  3 mL Intravenous  Q12H   Continuous Infusions: . sodium chloride 50 mL/hr at 09/23/13 1610    Principal Problem:   PE (pulmonary embolism) Active Problems:   Breast cancer   DVT (deep venous thrombosis)    Time spent: 35    [facility], Nazaire Cordial  [facility] Pager [phone]. If 7PM-7AM, please contact night-coverage at [email or web address], password [facility] 09/23/2013, 10:47 AM  LOS: 1 day

## 2013-09-23 NOTE — Care Management Note (Signed)
    Page 1 of 1   09/23/2013     2:08:50 PM   CARE MANAGEMENT NOTE 09/23/2013  Patient:  Jenna Gonzales, Jenna Gonzales   Account Number:  [id number]  Date Initiated:  09/23/2013  Documentation initiated by:  Jabir Dahlem  Subjective/Objective Assessment:   PT ADM ON 09/22/13 WITH DVT/PE.  PTA, PT INDEPENDENT OF ADLS.     Action/Plan:   CM REFERRAL TO CHECK XARELTO COVERAGE.   Anticipated DC Date:  09/23/2013   Anticipated DC Plan:  HOME/SELF CARE      DC Planning Services  CM consult  Medication Assistance      Choice offered to / List presented to:             Status of service:  Completed, signed off Medicare Important Message given?   (If response is "NO", the following Medicare IM given date fields will be blank) Date Medicare IM given:   Date Additional Medicare IM given:    Discharge Disposition:  HOME/SELF CARE  Per UR Regulation:  Reviewed for med. necessity/level of care/duration of stay  If discussed at Long Length of Stay Meetings, dates discussed:    Comments:  09/23/13 Rosana Farnell,RN,BSN [phone]  PER REP AT [facility], XARELTO IS COVERED, NO AUTH REQUIRED, THE CO-PAY FOR BOTH 15 AND 20 MG WOULD BE $35 EACH. TEST CLAIM WAS RUN THRU [facility]  PT GIVEN XARELTO CAREPATH CARD, WHICH WILL ALLOW HER TO OBTAIN XARELTO FOR $5/MONTH X 1 YEAR AFTER ACTIVATION.  SHE IS APPRECIATIVE OF HELP.

## 2013-09-23 NOTE — Progress Notes (Signed)
Pt verbalized understanding of dc instructions via teachback. DC home with belongings and instructions [place], [place]

## 2013-09-23 NOTE — Discharge Summary (Signed)
Physician Discharge Summary  Jenna Gonzales ZOX:[id number] DOB: 05-19-1971 DOA: 09/22/2013  PCP: No PCP Per Patient  Admit date: 09/22/2013 Discharge date: 09/23/2013  Time spent: 35 minutes  Recommendations for Outpatient Follow-up:  1.   Discharge Diagnoses:  Principal Problem:   PE (pulmonary embolism) Active Problems:   Breast cancer   DVT (deep venous thrombosis)   Discharge Condition: improved  Diet recommendation: cardiac  Filed Weights   09/22/13 1658 09/22/13 2149 09/23/13 0443  Weight: 80.287 kg (177 lb) 83.4 kg (183 lb 13.8 oz) 83.008 kg (183 lb)    History of present illness:  Jenna Gonzales is a 42 y.o. female with history of breast cancer status post lumpectomy radiation and chemotherapy in 2010/11 presently on tamoxifen started experiencing some pain in her right knee last week while exercising. Following which she had gone to the ER and had brace placed and was referred to the orthopedics. Patient had gone to the orthopedic consult today and other than patient stated that she's been having crampy pain of the calf muscle for last few days. Doppler of the lower study was done which showed extensive DVT. In the ER patient had a CT angiogram of the chest which also showed pulmonary embolism. Patient has been started on Lovenox and admitted for further management. Patient denies any chest pain or shortness of breath nausea vomiting abdominal pain fever chills. Patient is tachycardic but blood pressure is stable. Patient was given one dose of Ativan for possible anxiety.   Hospital Course:  Pulmonary embolism and DVT - patient has been placed on Lovenox per pharmacy. holding tamoxifen.   xarelto-  - discussed with Dr. Arlice Colt  History of breast cancer - see #1.  Possible right knee sprain - will need orthopedic followup.  Hypertension - continue metoprolol   Procedures:    Consultations:    Discharge Exam: Filed Vitals:   09/23/13 0443  BP:  122/85  Pulse: 93  Temp: 98.4 F (36.9 C)  Resp: 20      Discharge Instructions  Discharge Orders   Future Appointments Provider Department Dept Phone   07/18/2014 8:00 AM [facility] Only [facility] [phone]   07/25/2014 9:00 AM Lowella Dell, MD [facility] [phone]   Future Orders Complete By Expires   Diet - low sodium heart healthy  As directed    Discharge instructions  As directed    Comments:     xarelto 15 mg BID for 21 days then 20 mg daily   Increase activity slowly  As directed        Medication List    STOP taking these medications       tamoxifen 20 MG tablet  Commonly known as:  NOLVADEX      TAKE these medications       cetirizine 10 MG tablet  Commonly known as:  ZYRTEC  Take 10 mg by mouth daily as needed for allergies.     LORazepam 0.5 MG tablet  Commonly known as:  ATIVAN  Take 1 tablet (0.5 mg total) by mouth every 6 (six) hours as needed for anxiety.     metoprolol succinate 50 MG 24 hr tablet  Commonly known as:  TOPROL-XL  Take 50 mg by mouth every evening.     Rivaroxaban 15 MG Tabs tablet  Commonly known as:  XARELTO  Take 1 tablet (15 mg total) by mouth 2 (two) times daily with a meal.  Rivaroxaban 20 MG Tabs tablet  Commonly known as:  XARELTO  Take 1 tablet (20 mg total) by mouth daily with supper.  Start taking on:  10/14/2013       No Known Allergies     Follow-up Information   Follow up with [name],GUSTAV C, MD In 2 weeks.   Specialty:  Oncology   Contact information:   [address] [place] [address] [phone]       Please follow up. (can follow up with [facility] for PCP or anyother facility of your liking)        The results of significant diagnostics from this hospitalization (including imaging, microbiology, ancillary and laboratory) are listed below for reference.    Significant Diagnostic Studies: Ct  Angio Chest Pe W/cm &/or Wo Cm  09/22/2013   CLINICAL DATA:  Deep venous thrombosis ; history of breast carcinoma  EXAM: CT ANGIOGRAPHY CHEST WITH CONTRAST  TECHNIQUE: Multidetector CT imaging of the chest was performed using the standard protocol during bolus administration of intravenous contrast. Multiplanar CT image reconstructions including MIPs were obtained to evaluate the vascular anatomy.  CONTRAST:  OMNIPAQUE IOHEXOL 350 MG/ML SOLN  COMPARISON:  Chest radiograph January 10, 2010 and chest CT January 03, 2010  FINDINGS: There is a focal pulmonary embolus in the posterior segment right lower lobe pulmonary artery branch. No other pulmonary emboli are appreciable. There is no thoracic aortic aneurysm or dissection.  Lungs are clear. There is no demonstrable pulmonary embolus. Pericardium is not thickened.  Visualized upper abdominal structures appear normal. There are no blastic or lytic bone lesions.  Review of the MIP images confirms the above findings.  IMPRESSION: There is a focal pulmonary embolus in the posterior segment right lower lobe pulmonary artery branch. No larger pulmonary emboli identified. No edema or consolidation.  Critical Value/emergent results were called by telephone at the time of interpretation on 09/22/2013 at 8:12 PM to [facility] , who verbally acknowledged these results.   Electronically Signed   By: Bretta Bang   On: 09/22/2013 20:13   Dg Knee Complete 4 Views Right  09/14/2013   CLINICAL DATA:  The patient's right knee buckled while exercising. Right knee pain.  EXAM: RIGHT KNEE - COMPLETE 4+ VIEW  COMPARISON:  None.  FINDINGS: There is no evidence of fracture, dislocation, or joint effusion. There is no evidence of arthropathy or other focal bone abnormality. Soft tissues are unremarkable.  IMPRESSION: Negative.   Electronically Signed   By: Drusilla Kanner M.D.   On: 09/14/2013 21:58    Microbiology: No results found for this or any previous visit  (from the past 240 hour(s)).   Labs: Basic Metabolic Panel:  Recent Labs Lab 09/22/13 1907 09/23/13 0640  NA 142 139  K 3.7 3.6  CL 106 104  CO2  --  24  GLUCOSE 106* 106*  BUN 13 10  CREATININE 0.90 0.81  CALCIUM  --  9.2   Liver Function Tests: No results found for this basename: AST, ALT, ALKPHOS, BILITOT, PROT, ALBUMIN,  in the last 168 hours No results found for this basename: LIPASE, AMYLASE,  in the last 168 hours No results found for this basename: AMMONIA,  in the last 168 hours CBC:  Recent Labs Lab 09/22/13 1900 09/22/13 1907 09/23/13 0640  WBC 10.4  --  5.7  HGB 13.9 14.3 12.6  HCT 39.8 42.0 36.8  MCV 101.3*  --  100.5*  PLT 178  --  155   Cardiac Enzymes:  Recent Labs Lab 09/23/13 0015  TROPONINI <0.30   BNP: BNP (last 3 results) No results found for this basename: PROBNP,  in the last 8760 hours CBG: No results found for this basename: GLUCAP,  in the last 168 hours     Signed:  Marlin Canary  [facility] 09/23/2013, 12:42 PM

## 2013-09-23 NOTE — Progress Notes (Signed)
Nutrition Brief Note  Patient identified on the Malnutrition Screening Tool (MST) Report  Wt Readings from Last 15 Encounters:  09/23/13 183 lb (83.008 kg)  09/14/13 177 lb (80.287 kg)  07/21/13 186 lb (84.369 kg)  01/21/13 180 lb 8 oz (81.874 kg)  09/29/12 174 lb (78.926 kg)  07/17/12 175 lb 3.2 oz (79.47 kg)  07/15/12 176 lb (79.833 kg)  07/13/12 177 lb (80.287 kg)  01/07/12 178 lb 4.8 oz (80.876 kg)  11/15/08 169 lb (76.658 kg)    Body mass index is 27.83 kg/(m^2). Patient meets criteria for overweight based on current BMI.   Current diet order is Heart Healthy, patient is consuming approximately 100% of meals at this time. Labs and medications reviewed.   Pt with stable wt and intake at this time.  She does have questions r/t to Healthy Eating and juicing which are addressed with pt. RD provided "General Healthful Nutrition" handout from the Academy of [employer] and Dietetics. Emphasized the importance of serving sizes and provided examples of correct portions of common foods. Discussed importance of controlled and consistent intake throughout the day. Provided examples of ways to balance meals/snacks and encouraged intake of high-fiber, whole grain complex carbohydrates. Emphasized the importance of hydration with calorie-free beverages and limiting sugar-sweetened beverages. Teach back method used.  Expect good compliance.  No nutrition interventions warranted at this time. If nutrition issues arise, please consult RD.   Loyce Dys, MS RD LDN Clinical Inpatient Dietitian Pager: [phone] Weekend/After hours pager: [phone]

## 2013-09-23 NOTE — Progress Notes (Signed)
ANTICOAGULATION CONSULT NOTE - Initial Consult  Pharmacy Consult for Xarelto Indication: pulmonary embolus and DVT  No Known Allergies  Patient Measurements: Height: 5\' 8"  (172.7 cm) Weight: 183 lb (83.008 kg) IBW/kg (Calculated) : 63.9  Vital Signs: Temp: 98.4 F (36.9 C) (10/02 0443) Temp src: Oral (10/02 0443) BP: 122/85 mmHg (10/02 0443) Pulse Rate: 93 (10/02 0443)  Labs:  Recent Labs  09/22/13 1900 09/22/13 1907 09/23/13 0015 09/23/13 0640  HGB 13.9 14.3  --  12.6  HCT 39.8 42.0  --  36.8  PLT 178  --   --  155  CREATININE  --  0.90  --  0.81  TROPONINI  --   --  <0.30  --     Estimated Creatinine Clearance: 100 ml/min (by C-G formula based on Cr of 0.81).   Medical History: Past Medical History  Diagnosis Date  . Cellulitis 11/29/2011  . History of breast cancer   . Allergy   . Cancer 12/23/2008    Breast Cancer R; s/p lumpectomy with radiation, chemottherapy  . Hypertension    Assessment: 42 y/o F with h/o breast CA on tamoxifen PTA here with DVT/PE. Pt has received one dose of Lovenox 120 mg (1.5mg /kg) om 10/1 at 2049. Dr. Benjamine Mola has discussed case with Dr. Darnelle Catalan. Will go ahead and start Xarelto with supper this evening. CBC good, renal function good, no overt bleeding noted, no significant drug interactions noted.  Goal of Therapy:  Clot resolution  Monitor platelets by anticoagulation protocol: Yes   Plan:  -Xarelto 15 mg PO BID with meals for 21 days, followed by Xarelto 20 mg PO daily with evening meal  -CBC q72h -Monitor for bleeding  Thank you for allowing me to take part in this patient's care,  Abran Duke, PharmD Clinical Pharmacist Phone: [phone] Pager: [phone] 09/23/2013 12:27 PM

## 2013-09-24 ENCOUNTER — Telehealth: Payer: Self-pay | Admitting: *Deleted

## 2013-09-24 NOTE — Telephone Encounter (Signed)
sw pt gv appt for labs on 10/05/13 @ 8:15am and ov@ 1:15pm on 10/12/13...td

## 2013-10-05 ENCOUNTER — Other Ambulatory Visit (HOSPITAL_BASED_OUTPATIENT_CLINIC_OR_DEPARTMENT_OTHER): Payer: BC Managed Care – PPO | Admitting: Lab

## 2013-10-05 ENCOUNTER — Other Ambulatory Visit: Payer: BC Managed Care – PPO | Admitting: Lab

## 2013-10-05 DIAGNOSIS — D689 Coagulation defect, unspecified: Secondary | ICD-10-CM

## 2013-10-05 DIAGNOSIS — C50419 Malignant neoplasm of upper-outer quadrant of unspecified female breast: Secondary | ICD-10-CM

## 2013-10-06 LAB — HYPERCOAGULABLE PANEL, COMPREHENSIVE
Anticardiolipin IgG: 8 GPL U/mL (ref <23)
Beta-2 Glyco I IgG: 0 G Units (ref <20)
Beta-2-Glycoprotein I IgM: 0 M Units (ref <20)
DRVVT 1:1 Mix: 43.1 secs — ABNORMAL HIGH (ref <42.9)
DRVVT: 51.6 secs — ABNORMAL HIGH (ref <42.9)
Drvvt confirmation: 1.05 Ratio (ref <1.11)
Lupus Anticoagulant: NOT DETECTED
Protein C Activity: 200 % — ABNORMAL HIGH (ref 75–133)
Protein C, Total: 97 % (ref 72–160)
Protein S Activity: 160 % — ABNORMAL HIGH (ref 69–129)
Protein S Total: 99 % (ref 60–150)

## 2013-10-12 ENCOUNTER — Encounter: Payer: Self-pay | Admitting: Physician Assistant

## 2013-10-12 ENCOUNTER — Telehealth: Payer: Self-pay | Admitting: *Deleted

## 2013-10-12 ENCOUNTER — Ambulatory Visit (HOSPITAL_BASED_OUTPATIENT_CLINIC_OR_DEPARTMENT_OTHER): Payer: BC Managed Care – PPO | Admitting: Physician Assistant

## 2013-10-12 ENCOUNTER — Other Ambulatory Visit: Payer: BC Managed Care – PPO | Admitting: Lab

## 2013-10-12 VITALS — BP 143/92 | HR 105 | Temp 98.4°F | Resp 20 | Ht 68.0 in | Wt 181.9 lb

## 2013-10-12 DIAGNOSIS — Z780 Asymptomatic menopausal state: Secondary | ICD-10-CM

## 2013-10-12 DIAGNOSIS — C50419 Malignant neoplasm of upper-outer quadrant of unspecified female breast: Secondary | ICD-10-CM

## 2013-10-12 DIAGNOSIS — I82401 Acute embolism and thrombosis of unspecified deep veins of right lower extremity: Secondary | ICD-10-CM

## 2013-10-12 DIAGNOSIS — C50911 Malignant neoplasm of unspecified site of right female breast: Secondary | ICD-10-CM

## 2013-10-12 DIAGNOSIS — I82409 Acute embolism and thrombosis of unspecified deep veins of unspecified lower extremity: Secondary | ICD-10-CM

## 2013-10-12 DIAGNOSIS — Z170 Estrogen receptor positive status [ER+]: Secondary | ICD-10-CM

## 2013-10-12 DIAGNOSIS — I2699 Other pulmonary embolism without acute cor pulmonale: Secondary | ICD-10-CM

## 2013-10-12 NOTE — Progress Notes (Signed)
ID: Jenna Gonzales   DOB: 05-Apr-1971  MR#: [id number]  WUJ#:[id number]  PCP: No PCP Per Patient GYN: Maxie Better, MD SU: Glenna Fellows, MD OTHER MD:  CHIEF COMPLAINTS:  1) Right Breast Cancer      2) DVT/PE    HISTORY OF PRESENT ILLNESS: The patient saw Dr. Cherly Hensen for a routine gynecologic evaluation, and Dr. Cherly Hensen did a breast exam which showed a palpable mass in the right breast.  She was referred for a diagnostic mammography 11/03/2009, and Dr. Beckie Salts was able to confirm a palpable mass in the 10 o'clock position of the right breast 7 cm from the nipple.  There was no palpable adenopathy.  By mammography the breasts were dense, but there was a spiculated mass in the upper outer right breast corresponding to the palpable mass by ultrasound as measured 2.1 cm.  It was hypoechoic, irregular, and poorly defined.  Ultrasound of the right axilla demonstrated several normal sized lymph nodes, several of them showing cortical thickening.    With this information, the patient was brought back on 11/19 for biopsy.  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  The lymph node biopsy obtained at the same time was negative.  The tumor was ER positive at 99%, PR positive at 20% with a low proliferation marker at 12%, and HER-2/neu was not amplified with a ratio of 1.45.  Bilateral breast MRIs were obtained 11/22.  This confirmed a 2.2 cm irregularly marginated enhancing mass in the upper outer right breast with a clip in the mass.  There were no other masses, and aside from post biopsy changes in the right axilla, there were no abnormal appearing lymph nodes.    The patient proceeded to right lumpectomy and sentinel lymph node sampling 12/09 under Dr. Johna Sheriff because the two sentinel lymph nodes provided positive for cancer.  Dr. Johna Sheriff proceeded to a right axillary lymph node dissection in the same procedure.  The final  pathology [id number]) showed an invasive ductal carcinoma, grade 1, with negative although close margins (the closest was 1 mm posteriorly), with evidence of lymphovascular invasion and involving 2 out of 17 lymph nodes sampled (the 2 sentinel lymph nodes). Her subsequent history is as detailed below.   INTERVAL HISTORY: Jenna Gonzales returns today in between her normally scheduled followup visits for right breast cancer. She has been on tamoxifen since July of 2011, and had had no complications. She injured her right knee approximately one month ago, began to have swelling in the right lower extremity, and was subsequently diagnosed with a right lower extremity DVT and pulmonary embolism on 09/22/2013. Tamoxifen was immediately discontinued, and she was started on anticoagulation therapy. She continues on Xarelto, currently taking 15 mg twice daily. She will decrease her dose to 20 mg once daily later this week as instructed on October 23.  for followup of her breast cancer. Of note, Dr. Darnelle Catalan has reviewed counts hypercoagulable panel obtained on 10/05/2013 which she found unremarkable.   Jenna Gonzales is here today to discuss not only her anticoagulation therapy, but also to review options for antiestrogen treatment since we would not want to restart her on tamoxifen. She has not had a menstrual cycle since she began chemotherapy in March of 2011.   REVIEW OF SYSTEMS: Jenna Gonzales is feeling well otherwise. She's had no recent illnesses and denies any fevers or chills. She's had no skin changes. She denies any abnormal bruising or bleeding. Her appetite is good and  she's had no nausea or change in bowel or bladder habits. She currently has no cough, phlegm production, increased shortness of breath, chest pain, or palpitations. She does have some shortness of breath with exertion, but this is stable. She's had no additional peripheral swelling and denies any unusual myalgias, arthralgias, or bony pain. She also denies any  abnormal headaches, dizziness, or change in vision.  A detailed review of systems is otherwise stable and noncontributory.   PAST MEDICAL HISTORY: Past Medical History  Diagnosis Date  . Cellulitis 11/29/2011  . History of breast cancer   . Allergy   . Cancer 12/23/2008    Breast Cancer R; s/p lumpectomy with radiation, chemottherapy  . Hypertension   Mild GERD, history of a heart murmur, which was not appreciated today, history of borderline hypertension not under treatment, history of irritable bowel disease, and history of minimal arthritic changes involving particularly the index finger of the right hand.  PAST SURGICAL HISTORY: Past Surgical History  Procedure Laterality Date  . Breast lumpectomy  11/2009    right; s/p radiation, chemotherapy    FAMILY HISTORY Family History  Problem Relation Age of Onset  . Leukemia Father   The patient's father died from leukemia at the age of 21 (I cannot tell from the history if this was chronic or acute).  The patient's mother is alive at age 24.  The patient has 2 sisters and 1 brother, all here in [place].  She also has 2 half brothers living in [place].  There is no history of breast or ovarian cancer in the family.  GYNECOLOGIC HISTORY: She is GX, P1.  First pregnancy to term age 71.  Last menstrual period was in March 2011 with the initiation of chemotherapy.   SOCIAL HISTORY:  The patient previously worked at [employer] in [facility].  She worked for the [employer] for 8 years.  She is single. Her son Dahrahn lives with her.  He "could never finish school," and does not have a job at present.  He does have 2 daughters who live with their mother here in [place].  The patient attends [employer].    ADVANCED DIRECTIVES:  HEALTH MAINTENANCE: (Updated 10/12/2013) History  Substance Use Topics  . Smoking status: Never Smoker   . Smokeless tobacco: Never Used  . Alcohol Use: Yes      Comment: socially     Colonoscopy: Never  PAP: Dr. Cherly Hensen  Bone density: Never  Lipid panel: Due   No Known Allergies  Current Outpatient Prescriptions  Medication Sig Dispense Refill  . cetirizine (ZYRTEC) 10 MG tablet Take 10 mg by mouth daily as needed for allergies.      Marland Kitchen LORazepam (ATIVAN) 0.5 MG tablet Take 1 tablet (0.5 mg total) by mouth every 6 (six) hours as needed for anxiety.  10 tablet  0  . metoprolol succinate (TOPROL-XL) 50 MG 24 hr tablet Take 50 mg by mouth every evening.       . Rivaroxaban (XARELTO) 15 MG TABS tablet Take 1 tablet (15 mg total) by mouth 2 (two) times daily with a meal.  41 tablet  0  . [START ON 10/14/2013] Rivaroxaban (XARELTO) 20 MG TABS tablet Take 1 tablet (20 mg total) by mouth daily with supper.  30 tablet  0   No current facility-administered medications for this visit.    OBJECTIVE: Young African American woman who appears her stated age and is in no acute distress. Filed  Vitals:   10/12/13 1321  BP: 143/92  Pulse: 105  Temp: 98.4 F (36.9 C)  Resp: 20     Body mass index is 27.66 kg/(m^2).    ECOG FS: 0 Filed Weights   10/12/13 1321  Weight: 181 lb 14.4 oz (82.509 kg)   Physical Exam: HEENT:  Sclerae anicteric.  Oropharynx clear. Buccal mucosa is pink and moist NODES:  No cervical or supraclavicular lymphadenopathy palpated.  BREAST EXAM:  Right breast is status post lumpectomy with no suspicious nodularity or evidence of local recurrence. Left breast is unremarkable. Axillae are benign bilaterally for palpable lymphadenopathy. LUNGS:  Clear to auscultation bilaterally.  No wheezes or rhonchi. Good excursion bilaterally. HEART:  Regular rate and rhythm.  ABDOMEN:  Soft, nontender.  Positive bowel sounds.  MSK:  No focal spinal tenderness to palpation. Full range of motion in the upper extremities. There is some limited range of motion in the right knee status post recent injury. EXTREMITIES: 1+ pitting edema in the right lower  extremity. No additional peripheral edema noted on exam.   NEURO:  Nonfocal. Well oriented.  Positive affect.    LAB RESULTS: Lab Results  Component Value Date   WBC 5.7 09/23/2013   NEUTROABS 3.2 07/14/2013   HGB 12.6 09/23/2013   HCT 36.8 09/23/2013   MCV 100.5* 09/23/2013   PLT 155 09/23/2013      Chemistry      Component Value Date/Time   NA 139 09/23/2013 0640   NA 142 07/14/2013 0820   K 3.6 09/23/2013 0640   K 3.8 07/14/2013 0820   CL 104 09/23/2013 0640   CL 106 01/19/2013 1507   CO2 24 09/23/2013 0640   CO2 25 07/14/2013 0820   BUN 10 09/23/2013 0640   BUN 11.3 07/14/2013 0820   CREATININE 0.81 09/23/2013 0640   CREATININE 0.9 07/14/2013 0820      Component Value Date/Time   CALCIUM 9.2 09/23/2013 0640   CALCIUM 9.6 07/14/2013 0820   ALKPHOS 88 07/14/2013 0820   ALKPHOS 80 07/16/2012 1406   AST 19 07/14/2013 0820   AST 18 07/16/2012 1406   ALT 20 07/14/2013 0820   ALT 19 07/16/2012 1406   BILITOT 0.39 07/14/2013 0820   BILITOT 0.4 07/16/2012 1406       STUDIES: Ct Angio Chest Pe W/cm &/or Wo Cm  09/22/2013   CLINICAL DATA:  Deep venous thrombosis ; history of breast carcinoma  EXAM: CT ANGIOGRAPHY CHEST WITH CONTRAST  TECHNIQUE: Multidetector CT imaging of the chest was performed using the standard protocol during bolus administration of intravenous contrast. Multiplanar CT image reconstructions including MIPs were obtained to evaluate the vascular anatomy.  CONTRAST:  OMNIPAQUE IOHEXOL 350 MG/ML SOLN  COMPARISON:  Chest radiograph January 10, 2010 and chest CT January 03, 2010  FINDINGS: There is a focal pulmonary embolus in the posterior segment right lower lobe pulmonary artery branch. No other pulmonary emboli are appreciable. There is no thoracic aortic aneurysm or dissection.  Lungs are clear. There is no demonstrable pulmonary embolus. Pericardium is not thickened.  Visualized upper abdominal structures appear normal. There are no blastic or lytic bone lesions.  Review of  the MIP images confirms the above findings.  IMPRESSION: There is a focal pulmonary embolus in the posterior segment right lower lobe pulmonary artery branch. No larger pulmonary emboli identified. No edema or consolidation.  Critical Value/emergent results were called by telephone at the time of interpretation on 09/22/2013 at 8:12 PM to [facility]  MUTHERSBAUGH , who verbally acknowledged these results.   Electronically Signed   By: Bretta Bang   On: 09/22/2013 20:13   Dg Knee Complete 4 Views Right  09/14/2013   CLINICAL DATA:  The patient's right knee buckled while exercising. Right knee pain.  EXAM: RIGHT KNEE - COMPLETE 4+ VIEW  COMPARISON:  None.  FINDINGS: There is no evidence of fracture, dislocation, or joint effusion. There is no evidence of arthropathy or other focal bone abnormality. Soft tissues are unremarkable.  IMPRESSION: Negative.   Electronically Signed   By: Drusilla Kanner M.D.   On: 09/14/2013 21:58    ASSESSMENT: 42 y.o.  [place] woman   (1)  status post right lumpectomy and axillary lymph node dissection December of 2010 for a T2 N1, stage IIB invasive ductal carcinoma, grade 1, strongly estrogen receptor positive, moderately progesterone receptor positive, HER-2 negative, with a low proliferation fraction;   (2)  treated adjuvantly with 6 cycles of docetaxel/doxorubicin/cyclophosphamide, completed in May of 2011,   (3)   followed by radiation completed July of 2011 at which time she started tamoxifen.  (4)  tamoxifen was discontinued in October 2014 at which time patient was diagnosed with a right lower extremity DVT and pulmonary embolism.  (5)  anti-coagulation therapy with Xarelto, beginning 09/23/2013   PLAN: With regards to her breast cancer, Jenna Gonzales is doing extremely well. We do need to continue her on some type of antiestrogen therapy. Of course we do not want to continue on tamoxifen with the development of her recent DVT and PE. The question at this point is  whether or not she is actually postmenopausal.  We're going to bring Jenna Gonzales back later this week to check her hormone levels, including an estradiol, FSH, and LH.   If in fact she is found to be officially postmenopausal, we will start her on anastrozole, and we'll repeat those labs in 2 months to ensure that her ovaries have not "turned back on".  If she is not postmenopausal according to those labs, we will still start her on anastrozole, but will also start Zoladex injections to be given every 3 months. We briefly discussed the possibility of having her ovaries removed which at this time Jenna Gonzales is not interested in considering.  She's never had a bone density exam, and we will try to obtain that as well over the next month for a baseline prior to starting an aromatase inhibitor. I will plan on seeing Jenna Gonzales approximately 4-6 weeks after she starts the anastrozole, regardless of whether or not we have to start the Zoladex injections.  Jenna Gonzales was given all of this information in writing today. She voices understanding and agreement with our plan, and will call with any changes or problems.   Temeka Pore PA-C   10/12/2013

## 2013-10-12 NOTE — Telephone Encounter (Signed)
appts made and printed...td 

## 2013-10-13 ENCOUNTER — Telehealth: Payer: Self-pay | Admitting: *Deleted

## 2013-10-13 NOTE — Telephone Encounter (Signed)
Pt called to rs her lab appt for 10/14/13 @ 10am due to her hair appt. gv appt for the same day @ 11:45am per her request...td

## 2013-10-14 ENCOUNTER — Other Ambulatory Visit: Payer: BC Managed Care – PPO | Admitting: Lab

## 2013-10-14 ENCOUNTER — Other Ambulatory Visit (HOSPITAL_BASED_OUTPATIENT_CLINIC_OR_DEPARTMENT_OTHER): Payer: BC Managed Care – PPO | Admitting: Lab

## 2013-10-14 DIAGNOSIS — C50911 Malignant neoplasm of unspecified site of right female breast: Secondary | ICD-10-CM

## 2013-10-14 DIAGNOSIS — C50419 Malignant neoplasm of upper-outer quadrant of unspecified female breast: Secondary | ICD-10-CM

## 2013-10-14 LAB — FOLLICLE STIMULATING HORMONE: FSH: 16.8 m[IU]/mL

## 2013-10-20 LAB — ESTRADIOL, ULTRA SENS: Estradiol, Ultra Sensitive: 71 pg/mL

## 2013-10-22 ENCOUNTER — Other Ambulatory Visit: Payer: Self-pay | Admitting: Physician Assistant

## 2013-10-22 DIAGNOSIS — C50919 Malignant neoplasm of unspecified site of unspecified female breast: Secondary | ICD-10-CM

## 2013-10-22 DIAGNOSIS — I2699 Other pulmonary embolism without acute cor pulmonale: Secondary | ICD-10-CM

## 2013-10-22 DIAGNOSIS — I82409 Acute embolism and thrombosis of unspecified deep veins of unspecified lower extremity: Secondary | ICD-10-CM

## 2013-10-22 MED ORDER — ANASTROZOLE 1 MG PO TABS: 1.0000 mg | ORAL_TABLET | Freq: Every day | ORAL | Status: DC

## 2013-10-22 NOTE — Progress Notes (Signed)
Jenna Gonzales was recently seen for routine followup, and had discontinued her tamoxifen in late September due to a diagnosis of DVT and pulmonary embolism. We checked her hormone levels on 10/14/2013, and these show her to be premenopausal. (FSH = 16.8; LH = 23.6; and estradiol = 71)  Per our previous note, we are starting Jenna Gonzales on anastrozole, but we'll also give her goserelin injections every 3 months since she is not yet postmenopausal. I have spoken with her by phone today to give her all of these results, and I am also mailing her copy of her labs.  Jenna Gonzales will return next week on November 6 for her first goserelin injection. She will start the anastrozole the following day, 1 mg daily. She'll return to see me for repeat labs and physical exam and to assess tolerance of these medications in late December. We will schedule her every 3 month injection appointments when I see her in December.  Jenna Gonzales voices understanding and agreement with this plan, and knows to call with any changes or problems.  Zollie Scale, PA-C 10/22/2013

## 2013-10-25 ENCOUNTER — Telehealth: Payer: Self-pay | Admitting: Physician Assistant

## 2013-10-25 NOTE — Telephone Encounter (Signed)
sw pt made aware of 11/6 inj appt per 10/31 POF pt will pick up cal when here on 11/6 shh

## 2013-10-28 ENCOUNTER — Telehealth: Payer: Self-pay | Admitting: *Deleted

## 2013-10-28 ENCOUNTER — Ambulatory Visit: Payer: BC Managed Care – PPO

## 2013-10-28 NOTE — Telephone Encounter (Signed)
1620 -  Called pt on cell phone due to pt did not show for injection appt today at 3pm.  Pt stated she was in a meeting and need to cancel appt.  Asked if pt wish to reschedule appt, pt stated yes.  Nurse attempted to transfer pt's call to scheduler; however, pt had already hung up the phone. Desk nurse Val, RN notified of above info.

## 2013-11-09 ENCOUNTER — Other Ambulatory Visit: Payer: Self-pay | Admitting: Emergency Medicine

## 2013-11-10 ENCOUNTER — Other Ambulatory Visit: Payer: Self-pay

## 2013-11-10 DIAGNOSIS — I82409 Acute embolism and thrombosis of unspecified deep veins of unspecified lower extremity: Secondary | ICD-10-CM

## 2013-11-10 MED ORDER — RIVAROXABAN 20 MG PO TABS: 20.0000 mg | ORAL_TABLET | Freq: Every day | ORAL | Status: DC

## 2013-11-15 ENCOUNTER — Inpatient Hospital Stay: Admission: RE | Admit: 2013-11-15 | Payer: BC Managed Care – PPO | Source: Ambulatory Visit

## 2013-11-19 ENCOUNTER — Emergency Department (HOSPITAL_COMMUNITY)
Admission: EM | Admit: 2013-11-19 | Discharge: 2013-11-19 | Disposition: A | Discharge status: Home / Self Care | Payer: BC Managed Care – PPO | Attending: Emergency Medicine | Admitting: Emergency Medicine

## 2013-11-19 ENCOUNTER — Encounter (HOSPITAL_COMMUNITY): Payer: Self-pay | Admitting: Emergency Medicine

## 2013-11-19 DIAGNOSIS — Z872 Personal history of diseases of the skin and subcutaneous tissue: Secondary | ICD-10-CM | POA: Insufficient documentation

## 2013-11-19 DIAGNOSIS — Z79899 Other long term (current) drug therapy: Secondary | ICD-10-CM | POA: Insufficient documentation

## 2013-11-19 DIAGNOSIS — I10 Essential (primary) hypertension: Secondary | ICD-10-CM | POA: Insufficient documentation

## 2013-11-19 DIAGNOSIS — N939 Abnormal uterine and vaginal bleeding, unspecified: Secondary | ICD-10-CM

## 2013-11-19 DIAGNOSIS — Z86711 Personal history of pulmonary embolism: Secondary | ICD-10-CM | POA: Insufficient documentation

## 2013-11-19 DIAGNOSIS — Z7901 Long term (current) use of anticoagulants: Secondary | ICD-10-CM | POA: Insufficient documentation

## 2013-11-19 DIAGNOSIS — N898 Other specified noninflammatory disorders of vagina: Secondary | ICD-10-CM | POA: Insufficient documentation

## 2013-11-19 DIAGNOSIS — Z923 Personal history of irradiation: Secondary | ICD-10-CM | POA: Insufficient documentation

## 2013-11-19 DIAGNOSIS — Z86718 Personal history of other venous thrombosis and embolism: Secondary | ICD-10-CM | POA: Insufficient documentation

## 2013-11-19 DIAGNOSIS — Z853 Personal history of malignant neoplasm of breast: Secondary | ICD-10-CM | POA: Insufficient documentation

## 2013-11-19 DIAGNOSIS — Z3202 Encounter for pregnancy test, result negative: Secondary | ICD-10-CM | POA: Insufficient documentation

## 2013-11-19 LAB — URINALYSIS, ROUTINE W REFLEX MICROSCOPIC
Bilirubin Urine: NEGATIVE
Glucose, UA: NEGATIVE mg/dL
Protein, ur: NEGATIVE mg/dL
Urobilinogen, UA: 0.2 mg/dL (ref 0.0–1.0)

## 2013-11-19 LAB — CBC WITH DIFFERENTIAL/PLATELET
Eosinophils Absolute: 0 10*3/uL (ref 0.0–0.7)
Eosinophils Relative: 0 % (ref 0–5)
HCT: 44.4 % (ref 36.0–46.0)
Lymphocytes Relative: 23 % (ref 12–46)
Lymphs Abs: 1.9 10*3/uL (ref 0.7–4.0)
MCH: 34.6 pg — ABNORMAL HIGH (ref 26.0–34.0)
MCV: 101.1 fL — ABNORMAL HIGH (ref 78.0–100.0)
Monocytes Absolute: 0.6 10*3/uL (ref 0.1–1.0)
Platelets: 216 10*3/uL (ref 150–400)
RBC: 4.39 MIL/uL (ref 3.87–5.11)
RDW: 11.8 % (ref 11.5–15.5)
WBC: 8.2 10*3/uL (ref 4.0–10.5)

## 2013-11-19 LAB — URINE MICROSCOPIC-ADD ON

## 2013-11-19 LAB — POCT PREGNANCY, URINE: Preg Test, Ur: NEGATIVE

## 2013-11-19 MED ORDER — SODIUM CHLORIDE 0.9 % IV BOLUS (SEPSIS)
1000.0000 mL | Freq: Once | INTRAVENOUS | Status: AC
  Administered 2013-11-19: 1000 mL via INTRAVENOUS

## 2013-11-19 NOTE — ED Provider Notes (Signed)
CSN: [id number]     Arrival date & time 11/19/13  7829 History   First MD Initiated Contact with Patient 11/19/13 [phone]     Chief Complaint  Patient presents with  . Vaginal Bleeding   (Consider location/radiation/quality/duration/timing/severity/associated sxs/prior Treatment) HPI This 42 year old female has a history of breast cancer, last chemotherapy a few years ago, she has not had a menstrual cycle in the last few years, she is taking Xarelto for DVT and pulmonary embolism, this morning she woke up and when she urinated July she had a small amount of vaginal bleeding, she had some minimal pelvic cramp off-and-on for a few minutes at a time not currently present, she is no dysuria no vaginal discharge no fever no rash no chest pain or shortness breath no lightheadedness no current abdominal pain no other concerns, there is no treatment prior to arrival. Past Medical History  Diagnosis Date  . Cellulitis 11/29/2011  . History of breast cancer   . Allergy   . Cancer 12/23/2008    Breast Cancer R; s/p lumpectomy with radiation, chemottherapy  . Hypertension   . Pulmonary embolus   . DVT (deep venous thrombosis)    Past Surgical History  Procedure Laterality Date  . Breast lumpectomy  11/2009    right; s/p radiation, chemotherapy   Family History  Problem Relation Age of Onset  . Leukemia Father    History  Substance Use Topics  . Smoking status: Never Smoker   . Smokeless tobacco: Never Used  . Alcohol Use: Yes     Comment: socially   OB History   Grav Para Term Preterm Abortions TAB SAB Ect Mult Living                 Review of Systems 10 Systems reviewed and are negative for acute change except as noted in the HPI. Allergies  Review of patient's allergies indicates no known allergies.  Home Medications   Current Outpatient Rx  Name  Route  Sig  Dispense  Refill  . cetirizine (ZYRTEC) 10 MG tablet   Oral   Take 10 mg by mouth daily as needed for allergies.          . metoprolol succinate (TOPROL-XL) 50 MG 24 hr tablet   Oral   Take 50 mg by mouth every evening.          . Rivaroxaban (XARELTO) 20 MG TABS tablet   Oral   Take 1 tablet (20 mg total) by mouth daily with supper.   30 tablet   3   . anastrozole (ARIMIDEX) 1 MG tablet   Oral   Take 1 tablet (1 mg total) by mouth daily. Begin 1 day after first Zoladex injection.   30 tablet   5    BP 130/86  Pulse 97  Temp(Src) 98.3 F (36.8 C)  Resp 20  SpO2 100% Physical Exam  Nursing note and vitals reviewed. Constitutional:  Awake, alert, nontoxic appearance.  HENT:  Head: Atraumatic.  Eyes: Right eye exhibits no discharge. Left eye exhibits no discharge.  Neck: Neck supple.  Cardiovascular: Normal rate and regular rhythm.   No murmur heard. Pulmonary/Chest: Effort normal and breath sounds normal. No respiratory distress. She has no wheezes. She has no rales. She exhibits no tenderness.  Abdominal: Soft. Bowel sounds are normal. She exhibits no distension and no mass. There is no tenderness. There is no rebound and no guarding.  Genitourinary:  Chaperone present for pelvic examination, minimal dark blood from  cervical os, minimal clots in the vaginal vault, bimanual examination with closed cervical os, no cervical motion tenderness no palpable masses no adnexal tenderness  Musculoskeletal: She exhibits no tenderness.  Baseline ROM, no obvious new focal weakness.  Neurological: She is alert.  Mental status and motor strength appears baseline for patient and situation.  Skin: No rash noted.  Psychiatric: She has a normal mood and affect.    ED Course  Procedures (including critical care time) Pt stable in ED with no significant deterioration in condition.Patient / Family / Caregiver informed of clinical course, understand medical decision-making process, and agree with plan. Labs Review Labs Reviewed  URINALYSIS, ROUTINE W REFLEX MICROSCOPIC - Abnormal; Notable for the  following:    Hgb urine dipstick LARGE (*)    Leukocytes, UA SMALL (*)    All other components within normal limits  CBC WITH DIFFERENTIAL - Abnormal; Notable for the following:    Hemoglobin 15.2 (*)    MCV 101.1 (*)    MCH 34.6 (*)    All other components within normal limits  URINE MICROSCOPIC-ADD ON  POCT PREGNANCY, URINE   Imaging Review No results found.  EKG Interpretation   None       MDM   1. Abnormal vaginal bleeding    I doubt any other EMC precluding discharge at this time including, but not necessarily limited to the following:unstable bleeding.    Hurman Horn, MD 11/19/13 2039

## 2013-11-19 NOTE — ED Notes (Signed)
IV started hurting patient's left hand.  Slight amount of swelling at site.  IV removed per patient request.

## 2013-11-19 NOTE — ED Notes (Signed)
Pt takes blood thinners Xarelto for history of DVT and PE since Oct 2; last menstral cycle 2011; woke up this morning with cramping similar to menstrual cramping and had small amount of vaginal bleeding; no nausea; c/o feeling dizzy

## 2013-11-24 ENCOUNTER — Telehealth: Payer: Self-pay | Admitting: *Deleted

## 2013-11-24 DIAGNOSIS — C50919 Malignant neoplasm of unspecified site of unspecified female breast: Secondary | ICD-10-CM

## 2013-11-24 NOTE — Addendum Note (Signed)
Addended by: Laroy Apple E on: 11/24/2013 11:03 AM   Modules accepted: Orders

## 2013-11-24 NOTE — Telephone Encounter (Signed)
Patient called wanting to reschedule appts she missed for injection on 10/28/2013. Patient also cancelled Bone Scan that was to be completed before next office visit to discuss starting AI. Will discuss delay in plan with Zollie Scale, PA.

## 2013-11-26 ENCOUNTER — Telehealth: Payer: Self-pay | Admitting: Oncology

## 2013-11-26 NOTE — Telephone Encounter (Signed)
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

## 2013-12-02 ENCOUNTER — Ambulatory Visit (HOSPITAL_BASED_OUTPATIENT_CLINIC_OR_DEPARTMENT_OTHER): Payer: BC Managed Care – PPO

## 2013-12-02 VITALS — BP 136/91 | HR 106 | Temp 97.9°F

## 2013-12-02 DIAGNOSIS — Z5111 Encounter for antineoplastic chemotherapy: Secondary | ICD-10-CM

## 2013-12-02 DIAGNOSIS — C50919 Malignant neoplasm of unspecified site of unspecified female breast: Secondary | ICD-10-CM

## 2013-12-02 DIAGNOSIS — C50419 Malignant neoplasm of upper-outer quadrant of unspecified female breast: Secondary | ICD-10-CM

## 2013-12-02 MED ORDER — GOSERELIN ACETATE 10.8 MG ~~LOC~~ IMPL
10.8000 mg | DRUG_IMPLANT | SUBCUTANEOUS | Status: DC
  Administered 2013-12-02: 10.8 mg via SUBCUTANEOUS
  Filled 2013-12-02: qty 10.8

## 2013-12-10 ENCOUNTER — Encounter (HOSPITAL_COMMUNITY): Payer: Self-pay | Admitting: Emergency Medicine

## 2013-12-10 ENCOUNTER — Emergency Department (HOSPITAL_COMMUNITY)
Admission: EM | Admit: 2013-12-10 | Discharge: 2013-12-10 | Disposition: A | Discharge status: Home / Self Care | Payer: BC Managed Care – PPO | Attending: Emergency Medicine | Admitting: Emergency Medicine

## 2013-12-10 DIAGNOSIS — Z853 Personal history of malignant neoplasm of breast: Secondary | ICD-10-CM | POA: Insufficient documentation

## 2013-12-10 DIAGNOSIS — I10 Essential (primary) hypertension: Secondary | ICD-10-CM | POA: Insufficient documentation

## 2013-12-10 DIAGNOSIS — Z3202 Encounter for pregnancy test, result negative: Secondary | ICD-10-CM | POA: Insufficient documentation

## 2013-12-10 DIAGNOSIS — N949 Unspecified condition associated with female genital organs and menstrual cycle: Secondary | ICD-10-CM | POA: Insufficient documentation

## 2013-12-10 DIAGNOSIS — Z9221 Personal history of antineoplastic chemotherapy: Secondary | ICD-10-CM | POA: Insufficient documentation

## 2013-12-10 DIAGNOSIS — Z86711 Personal history of pulmonary embolism: Secondary | ICD-10-CM | POA: Insufficient documentation

## 2013-12-10 DIAGNOSIS — Z86718 Personal history of other venous thrombosis and embolism: Secondary | ICD-10-CM | POA: Insufficient documentation

## 2013-12-10 DIAGNOSIS — Z79899 Other long term (current) drug therapy: Secondary | ICD-10-CM | POA: Insufficient documentation

## 2013-12-10 DIAGNOSIS — N938 Other specified abnormal uterine and vaginal bleeding: Secondary | ICD-10-CM

## 2013-12-10 DIAGNOSIS — J329 Chronic sinusitis, unspecified: Secondary | ICD-10-CM | POA: Insufficient documentation

## 2013-12-10 DIAGNOSIS — Z872 Personal history of diseases of the skin and subcutaneous tissue: Secondary | ICD-10-CM | POA: Insufficient documentation

## 2013-12-10 LAB — URINE MICROSCOPIC-ADD ON

## 2013-12-10 LAB — URINALYSIS, ROUTINE W REFLEX MICROSCOPIC
Bilirubin Urine: NEGATIVE
Nitrite: NEGATIVE
Protein, ur: 30 mg/dL — AB
Specific Gravity, Urine: 1.007 (ref 1.005–1.030)
Urobilinogen, UA: 0.2 mg/dL (ref 0.0–1.0)

## 2013-12-10 LAB — CBC WITH DIFFERENTIAL/PLATELET
Basophils Relative: 0 % (ref 0–1)
HCT: 43 % (ref 36.0–46.0)
Hemoglobin: 14.7 g/dL (ref 12.0–15.0)
Lymphocytes Relative: 18 % (ref 12–46)
Lymphs Abs: 1.3 10*3/uL (ref 0.7–4.0)
MCHC: 34.2 g/dL (ref 30.0–36.0)
Monocytes Absolute: 0.4 10*3/uL (ref 0.1–1.0)
Monocytes Relative: 6 % (ref 3–12)
Neutro Abs: 5.7 10*3/uL (ref 1.7–7.7)
Neutrophils Relative %: 76 % (ref 43–77)
Platelets: 198 10*3/uL (ref 150–400)
RBC: 4.26 MIL/uL (ref 3.87–5.11)

## 2013-12-10 LAB — POCT PREGNANCY, URINE: Preg Test, Ur: NEGATIVE

## 2013-12-10 LAB — APTT: aPTT: 28 seconds (ref 24–37)

## 2013-12-10 LAB — PROTIME-INR: INR: 1.47 (ref 0.00–1.49)

## 2013-12-10 MED ORDER — AMOXICILLIN 500 MG PO CAPS: 500.0000 mg | ORAL_CAPSULE | Freq: Three times a day (TID) | ORAL | Status: DC

## 2013-12-10 NOTE — ED Provider Notes (Signed)
CSN: [id number]     Arrival date & time 12/10/13  0715 History   First MD Initiated Contact with Patient 12/10/13 0720     Chief Complaint  Patient presents with  . Vaginal Bleeding   (Consider location/radiation/quality/duration/timing/severity/associated sxs/prior Treatment) HPI Comments: Patient is 42 year old female with PMHx significant for breast ca for which she has previously been on tamoxifen.  She states that she was diagnosed with DVT and PE several months ago and that she was then placed on xarelto for this.  She reports that after chemotherapy about 4 years ago she stopped having a menstrual cycle.  She states that starting the end of November she began to have vaginal bleeding.  She states that episode lasted about 3-4 days (she was seen here for this) and then subsided spontaneously.  She states that about 2 weeks later she began to have menstrual bleeding once again.  She states that initially it was light during the day and heavier at night but that over the past several nights she has been awakened during the night with lower abdominal cramping.  She denies clots, burning with urination, pregnancy, nausea, vomiting, vaginal discharge.  She has not followed up with her GYN for this.  Patient is a 42 y.o. female presenting with vaginal bleeding. The history is provided by the patient. No language interpreter was used.  Vaginal Bleeding Quality:  Bright red and heavier than menses Severity:  Moderate Duration:  7 days Timing:  Sporadic Progression:  Worsening Chronicity:  New Menstrual history:  Irregular Number of pads used:  3 Possible pregnancy: no   Context: spontaneously   Relieved by:  Nothing Worsened by:  Nothing tried Ineffective treatments:  None tried Associated symptoms: no abdominal pain, no dizziness, no dysuria, no fatigue, no fever, no nausea and no vaginal discharge   Risk factors: no bleeding disorder, no PID, no STD and no STD exposure     Past Medical  History  Diagnosis Date  . Cellulitis 11/29/2011  . History of breast cancer   . Allergy   . Cancer 12/23/2008    Breast Cancer R; s/p lumpectomy with radiation, chemottherapy  . Hypertension   . Pulmonary embolus   . DVT (deep venous thrombosis)    Past Surgical History  Procedure Laterality Date  . Breast lumpectomy  11/2009    right; s/p radiation, chemotherapy   Family History  Problem Relation Age of Onset  . Leukemia Father    History  Substance Use Topics  . Smoking status: Never Smoker   . Smokeless tobacco: Never Used  . Alcohol Use: Yes     Comment: socially   OB History   Grav Para Term Preterm Abortions TAB SAB Ect Mult Living                 Review of Systems  Constitutional: Negative for fever and fatigue.  Gastrointestinal: Negative for nausea and abdominal pain.  Genitourinary: Positive for vaginal bleeding. Negative for dysuria and vaginal discharge.  Neurological: Negative for dizziness.  All other systems reviewed and are negative.    Allergies  Review of patient's allergies indicates no known allergies.  Home Medications   Current Outpatient Rx  Name  Route  Sig  Dispense  Refill  . anastrozole (ARIMIDEX) 1 MG tablet   Oral   Take 1 tablet (1 mg total) by mouth daily. Begin 1 day after first Zoladex injection.   30 tablet   5   . cetirizine (ZYRTEC)  10 MG tablet   Oral   Take 10 mg by mouth daily as needed for allergies.         . metoprolol succinate (TOPROL-XL) 50 MG 24 hr tablet   Oral   Take 50 mg by mouth every evening.          . Rivaroxaban (XARELTO) 20 MG TABS tablet   Oral   Take 1 tablet (20 mg total) by mouth daily with supper.   30 tablet   3    BP 149/95  Pulse 103  Resp 16  SpO2 100% Physical Exam  Nursing note and vitals reviewed. Constitutional: She is oriented to person, place, and time. She appears well-developed and well-nourished. No distress.  HENT:  Head: Normocephalic and atraumatic.  Right Ear:  External ear normal.  Left Ear: External ear normal.  Nose: Nose normal.  Mouth/Throat: Oropharynx is clear and moist.  Eyes: Conjunctivae are normal. Pupils are equal, round, and reactive to light. No scleral icterus.  Neck: Normal range of motion. Neck supple.  Cardiovascular: Normal rate, regular rhythm and normal heart sounds.  Exam reveals no gallop and no friction rub.   No murmur heard. Pulmonary/Chest: Effort normal and breath sounds normal. No respiratory distress. She has no wheezes. She has no rales. She exhibits no tenderness.  Abdominal: Soft. Bowel sounds are normal. She exhibits no distension and no mass. There is no tenderness. There is no rebound and no guarding.  Genitourinary: There is no rash or tenderness on the right labia. There is no rash or tenderness on the left labia. Uterus is not enlarged and not tender. Cervix exhibits no motion tenderness, no discharge and no friability. Right adnexum displays no mass and no tenderness. Left adnexum displays no mass and no tenderness. There is bleeding around the vagina. No tenderness around the vagina. No vaginal discharge found.  Moderate amount of dark red blood in vaginal vault with small clot, cervical os closed  Musculoskeletal: Normal range of motion. She exhibits no edema and no tenderness.  Lymphadenopathy:    She has no cervical adenopathy.  Neurological: She is alert and oriented to person, place, and time. She exhibits normal muscle tone. Coordination normal.  Skin: Skin is warm and dry. No rash noted. No erythema. No pallor.  Psychiatric: She has a normal mood and affect. Her behavior is normal. Judgment and thought content normal.    ED Course  Procedures (including critical care time) Labs Review Labs Reviewed  CBC WITH DIFFERENTIAL  PROTIME-INR  APTT  URINALYSIS, ROUTINE W REFLEX MICROSCOPIC   Imaging Review No results found.  EKG Interpretation   None      Results for orders placed during the  hospital encounter of 12/10/13  CBC WITH DIFFERENTIAL      Result Value Range   WBC 7.4  4.0 - 10.5 K/uL   RBC 4.26  3.87 - 5.11 MIL/uL   Hemoglobin 14.7  12.0 - 15.0 g/dL   HCT 63.8  75.6 - 43.3 %   MCV 100.9 (*) 78.0 - 100.0 fL   MCH 34.5 (*) 26.0 - 34.0 pg   MCHC 34.2  30.0 - 36.0 g/dL   RDW 29.5  18.8 - 41.6 %   Platelets 198  150 - 400 K/uL   Neutrophils Relative % 76  43 - 77 %   Neutro Abs 5.7  1.7 - 7.7 K/uL   Lymphocytes Relative 18  12 - 46 %   Lymphs Abs 1.3  0.7 -  4.0 K/uL   Monocytes Relative 6  3 - 12 %   Monocytes Absolute 0.4  0.1 - 1.0 K/uL   Eosinophils Relative 0  0 - 5 %   Eosinophils Absolute 0.0  0.0 - 0.7 K/uL   Basophils Relative 0  0 - 1 %   Basophils Absolute 0.0  0.0 - 0.1 K/uL  PROTIME-INR      Result Value Range   Prothrombin Time 17.4 (*) 11.6 - 15.2 seconds   INR 1.47  0.00 - 1.49  APTT      Result Value Range   aPTT 28  24 - 37 seconds  URINALYSIS, ROUTINE W REFLEX MICROSCOPIC      Result Value Range   Color, Urine RED (*) YELLOW   APPearance CLOUDY (*) CLEAR   Specific Gravity, Urine 1.007  1.005 - 1.030   pH 7.0  5.0 - 8.0   Glucose, UA NEGATIVE  NEGATIVE mg/dL   Hgb urine dipstick LARGE (*) NEGATIVE   Bilirubin Urine NEGATIVE  NEGATIVE   Ketones, ur NEGATIVE  NEGATIVE mg/dL   Protein, ur 30 (*) NEGATIVE mg/dL   Urobilinogen, UA 0.2  0.0 - 1.0 mg/dL   Nitrite NEGATIVE  NEGATIVE   Leukocytes, UA TRACE (*) NEGATIVE  URINE MICROSCOPIC-ADD ON      Result Value Range   Squamous Epithelial / LPF RARE  RARE   RBC / HPF TOO NUMEROUS TO COUNT  <3 RBC/hpf   Bacteria, UA RARE  RARE  POCT PREGNANCY, URINE      Result Value Range   Preg Test, Ur NEGATIVE  NEGATIVE   No results found.   MDM  DUB  Patient here on xarelto with new onset vaginal bleeding after 4 years of no menstrual cycle.  This is likely resumption of regular cycle, no emergency condition at this time.  Patient not anemic and clotting times are normal.  Will follow up with  GYN for further evaluation.  Not a candidate for BCP's at this time.   Izola Price Marisue Humble, PA-C 12/10/13 1022  Upon discharge the patient also requested an antibiotic for maxillary sinus pain - states that she has been taking zyrtec for allergies but that twice a year she ends up getting a sinus infection and she needs antibiotics for this as well.  She reports no fever, chills, headache, dizziness, nausea or vomiting.  PE update no frontal sinus ttp, bilateral maxillary sinus ttp - nasal turbinates boggy and erythematous, unable to transilluminate maxillary sinus.  Izola Price Marisue Humble, PA-C 12/10/13 1041

## 2013-12-10 NOTE — ED Notes (Addendum)
Pt complains of vaginal bleeding for past week. Had vaginal bleeding around thanksgiving. On blood thinners for DVT/PE. Pt on tamoxasin for breast ca until recently.

## 2013-12-13 NOTE — ED Provider Notes (Signed)
Medical screening examination/treatment/procedure(s) were performed by non-physician practitioner and as supervising physician I was immediately available for consultation/collaboration.  EKG Interpretation   None        Raeford Razor, MD 12/13/13 1351

## 2013-12-20 ENCOUNTER — Ambulatory Visit (HOSPITAL_BASED_OUTPATIENT_CLINIC_OR_DEPARTMENT_OTHER): Payer: BC Managed Care – PPO | Admitting: Physician Assistant

## 2013-12-20 ENCOUNTER — Ambulatory Visit: Payer: BC Managed Care – PPO

## 2013-12-20 ENCOUNTER — Telehealth: Payer: Self-pay | Admitting: Oncology

## 2013-12-20 ENCOUNTER — Other Ambulatory Visit (HOSPITAL_BASED_OUTPATIENT_CLINIC_OR_DEPARTMENT_OTHER): Payer: BC Managed Care – PPO

## 2013-12-20 ENCOUNTER — Encounter: Payer: Self-pay | Admitting: Physician Assistant

## 2013-12-20 VITALS — BP 140/95 | HR 92 | Temp 98.7°F | Resp 20 | Ht 68.0 in | Wt 180.4 lb

## 2013-12-20 DIAGNOSIS — C50419 Malignant neoplasm of upper-outer quadrant of unspecified female breast: Secondary | ICD-10-CM

## 2013-12-20 DIAGNOSIS — C50911 Malignant neoplasm of unspecified site of right female breast: Secondary | ICD-10-CM

## 2013-12-20 DIAGNOSIS — I2699 Other pulmonary embolism without acute cor pulmonale: Secondary | ICD-10-CM

## 2013-12-20 DIAGNOSIS — I82409 Acute embolism and thrombosis of unspecified deep veins of unspecified lower extremity: Secondary | ICD-10-CM

## 2013-12-20 DIAGNOSIS — J309 Allergic rhinitis, unspecified: Secondary | ICD-10-CM

## 2013-12-20 DIAGNOSIS — J310 Chronic rhinitis: Secondary | ICD-10-CM

## 2013-12-20 DIAGNOSIS — Z170 Estrogen receptor positive status [ER+]: Secondary | ICD-10-CM

## 2013-12-20 DIAGNOSIS — I10 Essential (primary) hypertension: Secondary | ICD-10-CM | POA: Insufficient documentation

## 2013-12-20 DIAGNOSIS — C50919 Malignant neoplasm of unspecified site of unspecified female breast: Secondary | ICD-10-CM

## 2013-12-20 DIAGNOSIS — R232 Flushing: Secondary | ICD-10-CM

## 2013-12-20 LAB — CBC WITH DIFFERENTIAL/PLATELET
BASO%: 0.5 % (ref 0.0–2.0)
Basophils Absolute: 0 10*3/uL (ref 0.0–0.1)
EOS%: 0.3 % (ref 0.0–7.0)
Eosinophils Absolute: 0 10*3/uL (ref 0.0–0.5)
HCT: 44.7 % (ref 34.8–46.6)
HGB: 15.3 g/dL (ref 11.6–15.9)
LYMPH%: 27.4 % (ref 14.0–49.7)
MCH: 35.1 pg — ABNORMAL HIGH (ref 25.1–34.0)
MCHC: 34.1 g/dL (ref 31.5–36.0)
MCV: 103 fL — ABNORMAL HIGH (ref 79.5–101.0)
MONO#: 0.4 10*3/uL (ref 0.1–0.9)
MONO%: 6.1 % (ref 0.0–14.0)
NEUT%: 65.7 % (ref 38.4–76.8)
Platelets: 206 10*3/uL (ref 145–400)
RBC: 4.34 10*6/uL (ref 3.70–5.45)
WBC: 6.2 10*3/uL (ref 3.9–10.3)

## 2013-12-20 LAB — COMPREHENSIVE METABOLIC PANEL (CC13)
AST: 17 U/L (ref 5–34)
Albumin: 3.7 g/dL (ref 3.5–5.0)
Alkaline Phosphatase: 104 U/L (ref 40–150)
Anion Gap: 11 mEq/L (ref 3–11)
CO2: 25 mEq/L (ref 22–29)
Glucose: 130 mg/dl (ref 70–140)
Sodium: 142 mEq/L (ref 136–145)
Total Bilirubin: 0.41 mg/dL (ref 0.20–1.20)
Total Protein: 7.7 g/dL (ref 6.4–8.3)

## 2013-12-20 MED ORDER — METOPROLOL SUCCINATE ER 50 MG PO TB24: 50.0000 mg | ORAL_TABLET | Freq: Every evening | ORAL | Status: DC

## 2013-12-20 MED ORDER — FLUTICASONE PROPIONATE 50 MCG/ACT NA SUSP: NASAL | Status: DC

## 2013-12-20 NOTE — Progress Notes (Signed)
ID: Jenna Gonzales   DOB: 1971/02/15  MR#: [id number]  NFA#:[id number]  PCP: No PCP Per Patient GYN: Maxie Better, MD SU: Glenna Fellows, MD OTHER MD:  CHIEF COMPLAINTS:  1) Hx of Right Breast Cancer      2) DVT/PE    HISTORY OF PRESENT ILLNESS: The patient saw Dr. Cherly Hensen for a routine gynecologic evaluation, and Dr. Cherly Hensen did a breast exam which showed a palpable mass in the right breast.  She was referred for a diagnostic mammography 11/03/2009, and Dr. Beckie Salts was able to confirm a palpable mass in the 10 o'clock position of the right breast 7 cm from the nipple.  There was no palpable adenopathy.  By mammography the breasts were dense, but there was a spiculated mass in the upper outer right breast corresponding to the palpable mass by ultrasound as measured 2.1 cm.  It was hypoechoic, irregular, and poorly defined.  Ultrasound of the right axilla demonstrated several normal sized lymph nodes, several of them showing cortical thickening.    With this information, the patient was brought back on 11/19 for biopsy.  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  The lymph node biopsy obtained at the same time was negative.  The tumor was ER positive at 99%, PR positive at 20% with a low proliferation marker at 12%, and HER-2/neu was not amplified with a ratio of 1.45.  Bilateral breast MRIs were obtained 11/22.  This confirmed a 2.2 cm irregularly marginated enhancing mass in the upper outer right breast with a clip in the mass.  There were no other masses, and aside from post biopsy changes in the right axilla, there were no abnormal appearing lymph nodes.    The patient proceeded to right lumpectomy and sentinel lymph node sampling 12/09 under Dr. Johna Sheriff because the two sentinel lymph nodes provided positive for cancer.  Dr. Johna Sheriff proceeded to a right axillary lymph node dissection in the same procedure.  The final  pathology [id number]) showed an invasive ductal carcinoma, grade 1, with negative although close margins (the closest was 1 mm posteriorly), with evidence of lymphovascular invasion and involving 2 out of 17 lymph nodes sampled (the 2 sentinel lymph nodes). Her subsequent history is as detailed below.   INTERVAL HISTORY: Jenna Gonzales returns alone today for followup of her right breast cancer. As a brief recap, Jenna Gonzales was on tamoxifen between July of 2011 and October 2014 when she was diagnosed with a right lower extremity DVT and PE, and tamoxifen was immediately discontinued. She continues o anti-coagulation therapy, taking Xarelto, 20 mg daily. Other than vaginal bleeding which will be discussed below, she's had no signs of abnormal bleeding elsewhere, and denies any increased shortness of breath.  We saw Jenna Gonzales in October at which time we checked her hormone levels, and she was found to be premenopausal. Within approximately 30 days of discontinuing the tamoxifen, she began to have vaginal bleeding, which had not occurred since before her chemotherapy in 2011. This happened on 2 occasions, and in fact she was so concerned she was evaluated in the emergency room.  The bleeding finally ceased approximately 2 weeks after receiving her first Zoladex injection.  We had actually scheduled Jenna Gonzales for her first Zoladex injection on November 6, but unfortunately she was unable to keep that appointment. Accordingly, she did not receive her first injection of Zoladex until December 11. She then started on anastrozole on which she continues with good tolerance .  Jenna Gonzales  has had some increased hot flashes, sometimes coughing some problems sleeping, and resulting in fatigue.  She's also had some increased anxiety. She's having some personal issues and financial stressors, all of which she feels are making her even more fatigued. She continues to have some chronic rhinitis with a runny nose and sinus congestion. She's had no fevers  or chills. She has no significant cough. She has some shortness of breath with exertion which is stable. She also has some chronic back pain which is unchanged. She's had no significant joint pain since beginning the anastrozole.    REVIEW OF SYSTEMS: Jenna Gonzales has had no skin changes or rashes.  Her appetite is good and she's had no nausea or change in bowel or bladder habits. She denies chest pain or palpitations. She's had no additional peripheral swelling and denies any additional myalgias, arthralgias, or bony pain. She also denies any abnormal headaches, dizziness, or change in vision.  A detailed review of systems is otherwise stable and noncontributory.   PAST MEDICAL HISTORY: Past Medical History  Diagnosis Date  . Cellulitis 11/29/2011  . History of breast cancer   . Allergy   . Cancer 12/23/2008    Breast Cancer R; s/p lumpectomy with radiation, chemottherapy  . Hypertension   . Pulmonary embolus   . DVT (deep venous thrombosis)   Mild GERD, history of a heart murmur, which was not appreciated today, history of borderline hypertension not under treatment, history of irritable bowel disease, and history of minimal arthritic changes involving particularly the index finger of the right hand.  PAST SURGICAL HISTORY: Past Surgical History  Procedure Laterality Date  . Breast lumpectomy  11/2009    right; s/p radiation, chemotherapy    FAMILY HISTORY Family History  Problem Relation Age of Onset  . Leukemia Father   The patient's father died from leukemia at the age of 96 (I cannot tell from the history if this was chronic or acute).  The patient's mother is alive at age 43.  The patient has 2 sisters and 1 brother, all here in [place].  She also has 2 half brothers living in [place].  There is no history of breast or ovarian cancer in the family.  GYNECOLOGIC HISTORY: She is GX, P1.  First pregnancy to term age 89.  Last menstrual period was in March 2011 with the initiation of  chemotherapy.   SOCIAL HISTORY:  The patient previously worked at [employer] in [facility].  She worked for the [employer] for 8 years.  She is single. Her son Dahrahn lives with her.  He "could never finish school," and does not have a job at present.  He does have 2 daughters who live with their mother here in [place].  The patient attends [employer].    ADVANCED DIRECTIVES:  HEALTH MAINTENANCE: (Updated 10/12/2013) History  Substance Use Topics  . Smoking status: Never Smoker   . Smokeless tobacco: Never Used  . Alcohol Use: Yes     Comment: socially     Colonoscopy: Never  PAP: Dr. Cherly Hensen  Bone density: Never  Lipid panel: Due   No Known Allergies  Current Outpatient Prescriptions  Medication Sig Dispense Refill  . anastrozole (ARIMIDEX) 1 MG tablet Take 1 tablet (1 mg total) by mouth daily. Begin 1 day after first Zoladex injection.  30 tablet  5  . cetirizine (ZYRTEC) 10 MG tablet Take 10 mg by mouth daily as needed for allergies.      Marland Kitchen  metoprolol succinate (TOPROL-XL) 50 MG 24 hr tablet Take 1 tablet (50 mg total) by mouth every evening.  30 tablet  0  . Rivaroxaban (XARELTO) 20 MG TABS tablet Take 1 tablet (20 mg total) by mouth daily with supper.  30 tablet  3  . fluticasone (FLONASE) 50 MCG/ACT nasal spray 1 spray in each nostril twice daily  16 g  2   No current facility-administered medications for this visit.    OBJECTIVE: Young African American woman who appears her stated age and is in no acute distress. Filed Vitals:   12/20/13 0843  BP: 140/95  Pulse: 92  Temp: 98.7 F (37.1 C)  Resp: 20     Body mass index is 27.44 kg/(m^2).    ECOG FS: 0 Filed Weights   12/20/13 0843  Weight: 180 lb 6.4 oz (81.829 kg)   Physical Exam: HEENT:  Sclerae anicteric.  Oropharynx clear and moist. Neck supple with trachea midline  NODES:  No cervical or supraclavicular lymphadenopathy palpated.  BREAST EXAM:  right breast is  status post lumpectomy with no stitches nodularities or skin changes. No evidence of local recurrence. Left breast is unremarkable.  Axillae are benign bilaterally for palpable lymphadenopathy. LUNGS:  Clear to auscultation bilaterally with good excursion.  No wheezes or rhonchi.  HEART:  Regular rate and rhythm.  no murmur appreciated  ABDOMEN:  Soft, nontender.  Positive, normoactive  bowel sounds.  MSK:  No focal spinal tenderness to palpation. Full range of motion in the upper extremities.  EXTREMITIES:  no peripheral edema  NEURO:  Nonfocal. Well oriented.  Positive affect. SKIN: Clear with no obvious rashes or skin lesions. No excessive ecchymoses. No petechiae.     LAB RESULTS: Lab Results  Component Value Date   WBC 6.2 12/20/2013   NEUTROABS 4.1 12/20/2013   HGB 15.3 12/20/2013   HCT 44.7 12/20/2013   MCV 103.0* 12/20/2013   PLT 206 12/20/2013      Chemistry      Component Value Date/Time   NA 142 12/20/2013 0822   NA 139 09/23/2013 0640   K 4.0 12/20/2013 0822   K 3.6 09/23/2013 0640   CL 104 09/23/2013 0640   CL 106 01/19/2013 1507   CO2 25 12/20/2013 0822   CO2 24 09/23/2013 0640   BUN 13.1 12/20/2013 0822   BUN 10 09/23/2013 0640   CREATININE 0.9 12/20/2013 0822   CREATININE 0.81 09/23/2013 0640      Component Value Date/Time   CALCIUM 10.2 12/20/2013 0822   CALCIUM 9.2 09/23/2013 0640   ALKPHOS 104 12/20/2013 0822   ALKPHOS 80 07/16/2012 1406   AST 17 12/20/2013 0822   AST 18 07/16/2012 1406   ALT 20 12/20/2013 0822   ALT 19 07/16/2012 1406   BILITOT 0.41 12/20/2013 0822   BILITOT 0.4 07/16/2012 1406       STUDIES:  Patient's most recent bilateral mammogram was on 01/04/2013 and was unremarkable. This is scheduled to be repeated along with a baseline bone density in January 2015.     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  (4)  tamoxifen was discontinued in October 2014 at which time patient was diagnosed with a right lower extremity DVT and pulmonary embolism.  (5)  anti-coagulation therapy with Xarelto, beginning 09/23/2013  (6)  labs drawn in October 2014 confirmed a premenopausal status. Patient received her first Q3 month injection of goserelin on 12/02/2013, and then began on anastrozole at 1 mg daily.    PLAN: With regards to her breast cancer, Jenna Gonzales is doing well.  Thus far, she appears to be tolerating both of goserelin and the anastrozole well with the exception of increased hot flashes. Hopefully she'll have no additional vaginal bleeding, but she will contact us if she does. She's also due for a pelvic exam, and will call Dr. Cherly Hensen soon to make an appointment.   As noted above, she is due for her next annual mammogram as well as a baseline bone density in January. She'll be due for her second dose well an injection on March 5, and I will see her that day for repeat labs and physical exam as well.  At that point she will also have been on anticoagulation therapy for almost 6 months, and we will assess our plan decide how long to continue the Xarelto.  I have given her a prescription for Flonase nasal spray for her chronic rhinitis, and she will use one spray in each nostril twice daily.  She is in the process of finding a new primary care physician, and I have given her a 30 day refill her metoprolol, 50 mg daily. Future refills will need to come from a primary care physician.   Kim voices understanding and agreement with our plan, and will call with any  changes or problems.   Shaleah Nissley PA-C   12/20/2013

## 2013-12-30 ENCOUNTER — Other Ambulatory Visit: Payer: Self-pay | Admitting: Physician Assistant

## 2013-12-30 ENCOUNTER — Other Ambulatory Visit: Payer: BC Managed Care – PPO

## 2013-12-30 DIAGNOSIS — Z853 Personal history of malignant neoplasm of breast: Secondary | ICD-10-CM

## 2013-12-30 DIAGNOSIS — Z1231 Encounter for screening mammogram for malignant neoplasm of breast: Secondary | ICD-10-CM

## 2014-01-13 ENCOUNTER — Encounter: Payer: Self-pay | Admitting: Family Medicine

## 2014-01-13 ENCOUNTER — Ambulatory Visit (INDEPENDENT_AMBULATORY_CARE_PROVIDER_SITE_OTHER): Payer: BC Managed Care – PPO | Admitting: Family Medicine

## 2014-01-13 VITALS — BP 132/82 | Temp 98.5°F | Ht 68.5 in | Wt 179.0 lb

## 2014-01-13 DIAGNOSIS — I82409 Acute embolism and thrombosis of unspecified deep veins of unspecified lower extremity: Secondary | ICD-10-CM

## 2014-01-13 DIAGNOSIS — C50919 Malignant neoplasm of unspecified site of unspecified female breast: Secondary | ICD-10-CM

## 2014-01-13 DIAGNOSIS — I10 Essential (primary) hypertension: Secondary | ICD-10-CM

## 2014-01-13 DIAGNOSIS — J329 Chronic sinusitis, unspecified: Secondary | ICD-10-CM

## 2014-01-13 DIAGNOSIS — Z7689 Persons encountering health services in other specified circumstances: Secondary | ICD-10-CM

## 2014-01-13 DIAGNOSIS — J309 Allergic rhinitis, unspecified: Secondary | ICD-10-CM

## 2014-01-13 DIAGNOSIS — Z7189 Other specified counseling: Secondary | ICD-10-CM

## 2014-01-13 MED ORDER — AMOXICILLIN-POT CLAVULANATE 875-125 MG PO TABS: 1.0000 | ORAL_TABLET | Freq: Two times a day (BID) | ORAL | Status: DC

## 2014-01-13 NOTE — Patient Instructions (Addendum)
We recommend the following healthy lifestyle measures: - eat a healthy diet consisting of lots of vegetables, fruits, beans, nuts, seeds, healthy meats such as white chicken and fish and whole grains.  - avoid fried foods, fast food, processed foods, sodas, red meet and other fattening foods.  - get a least 150 minutes of aerobic exercise per week.  -avoid sodium and alcohol  -As we discussed, we have prescribed a new medication (Augmentin) for you at this appointment. We discussed the common and serious potential adverse effects of this medication and you can review these and more with the pharmacist when you pick up your medication.  Please follow the instructions for use carefully and notify us immediately if you have any problems taking this medication.  -continue flonase, add zytec - if not getting better should see ENT  Stop your blood pressure medication and keep track of blood pressure at home - bring log to appointment in 1 month - if running over 170/90 please call us in the meantime.   Follow up in: 1 month

## 2014-01-13 NOTE — Progress Notes (Signed)
Pre visit review using our clinic review tool, if applicable. No additional management support is needed unless otherwise documented below in the visit note. 

## 2014-01-13 NOTE — Progress Notes (Signed)
Chief Complaint  Patient presents with  . Establish Care    HPI:  Jenna Gonzales is here to establish care.  Last PCP and physical: Sees Dr. Garwin Brothers for yearly physicals  Has the following chronic problems and concerns today:  Patient Active Problem List   Diagnosis Date Noted  . HTN (hypertension) 12/20/2013  . PE (pulmonary embolism) 09/22/2013  . DVT (deep venous thrombosis) 09/22/2013  . Allergic rhinitis 09/29/2012  . Breast cancer 01/07/2012  . GERD 11/15/2008  . 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   Chronic Sinus issues: -reports usually get a sinus infection every year -tx with amoxicillin a few weeks ago and this helped and has been using flonase that has helped -reports had relapse of sinus symptoms the last few days with sinus congestion, drainage, sinus pain and pressure -denies: fever, chills, SOB  HTN: -she is on BB - but wonders if can stop this as does not like taking it -wants to work on exercise and diet instead -denies: CP, SOB, vision changes  Breast Cancer: -followed by onc and surgeon  Hx of DVT and PE: -thought to be related to her tamoxifen, on xarelto since October - and reports her heme/onc doctor wants her to be on this for 6 months  Health Maintenance: -will to flu next visit -reports utd on tdap ROS: See pertinent positives and negatives per HPI.  Past Medical History  Diagnosis Date  . Cellulitis 11/29/2011  . History of breast cancer   . Allergy   . Cancer 12/23/2008    Breast Cancer R; s/p lumpectomy with radiation, chemottherapy  . Hypertension   . Pulmonary embolus   . DVT (deep venous thrombosis)   . GERD (gastroesophageal reflux disease)   . Hypertension     Family History  Problem Relation Age of Onset  . Leukemia Father   . Arthritis    . Heart disease    . Hypertension    . Kidney disease    . Diabetes    . Diabetes Other     History   Social History  . Marital Status: Single    Spouse Name: N/A    Number  of Children: N/A  . Years of Education: N/A   Social History Main Topics  . Smoking status: Never Smoker   . Smokeless tobacco: Never Used  . Alcohol Use: Yes     Comment: socially  . Drug Use: No  . Sexual Activity: None   Other Topics Concern  . 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      Home Situation: lives alone      Spiritual Beliefs: none      Lifestyle: going to start exercise - diet is ok             Current outpatient prescriptions:anastrozole (ARIMIDEX) 1 MG tablet, Take 1 tablet (1 mg total) by mouth daily. Begin 1 day after first Zoladex injection., Disp: 30 tablet, Rfl: 5;  fluticasone (FLONASE) 50 MCG/ACT nasal spray, 1 spray in each nostril twice daily, Disp: 16 g, Rfl: 2;  metoprolol succinate (TOPROL-XL) 50 MG 24 hr tablet, Take 1 tablet (50 mg total) by mouth every evening., Disp: 30 tablet, Rfl: 0 Rivaroxaban (XARELTO) 20 MG TABS tablet, Take 1 tablet (20 mg total) by mouth daily with supper., Disp: 30 tablet, Rfl: 3;  amoxicillin-clavulanate (AUGMENTIN) 875-125 MG per tablet, Take 1 tablet by mouth 2 (two) times  daily., Disp: 20 tablet, Rfl: 0;  cetirizine (ZYRTEC) 10 MG tablet, Take 10 mg by mouth daily as needed for allergies., Disp: , Rfl:   EXAM:  Filed Vitals:   01/13/14 1427  BP: 132/82  Temp: 98.5 F (36.9 C)    Body mass index is 26.82 kg/(m^2).  GENERAL: vitals reviewed and listed above, alert, oriented, appears well hydrated and in no acute distress  HEENT: atraumatic, conjunttiva clear, no obvious abnormalities on inspection of external nose and ears  NECK: no obvious masses on inspection  LUNGS: clear to auscultation bilaterally, no wheezes, rales or rhonchi, good air movement  CV: HRRR, no peripheral edema  MS: moves all extremities without noticeable abnormality  PSYCH: pleasant and cooperative, no obvious depression or anxiety  ASSESSMENT AND PLAN:  Discussed the  following assessment and plan:  Encounter to establish care  Sinusitis - Plan: amoxicillin-clavulanate (AUGMENTIN) 875-125 MG per tablet  HTN (hypertension)  DVT (deep venous thrombosis)  Breast cancer  Allergic rhinitis -We reviewed the PMH, PSH, FH, SH, Meds and Allergies. -We provided refills for any medications we will prescribe as needed. -We addressed current concerns per orders and patient instructions. -We have asked for records for pertinent exams, studies, vaccines and notes from previous providers. -We have advised patient to follow up per instructions below. -discussed risks/benefits BP medication and she wishes to do trial off medication - will observe carefully at home and follow up in 1 month -augmentin for possible partially tx sinus infection and she will see ent if persists or recurs frequently -follow up 1 month  -Patient advised to return or notify a doctor immediately if symptoms worsen or persist or new concerns arise.  Patient Instructions  We recommend the following healthy lifestyle measures: - eat a healthy diet consisting of lots of vegetables, fruits, beans, nuts, seeds, healthy meats such as white chicken and fish and whole grains.  - avoid fried foods, fast food, processed foods, sodas, red meet and other fattening foods.  - get a least 150 minutes of aerobic exercise per week.  -avoid sodium and alcohol  -As we discussed, we have prescribed a new medication (Augmentin) for you at this appointment. We discussed the common and serious potential adverse effects of this medication and you can review these and more with the pharmacist when you pick up your medication.  Please follow the instructions for use carefully and notify us immediately if you have any problems taking this medication.  -continue flonase, add zytec - if not getting better should see ENT  Stop your blood pressure medication and keep track of blood pressure at home - bring log to  appointment in 1 month - if running over 170/90 please call us in the meantime.   Follow up in: 1 month      Sydney Hasten R.

## 2014-01-21 ENCOUNTER — Other Ambulatory Visit: Payer: BC Managed Care – PPO

## 2014-01-26 ENCOUNTER — Telehealth: Payer: Self-pay | Admitting: Family Medicine

## 2014-01-26 NOTE — Telephone Encounter (Signed)
Relevant patient education mailed to patient.  

## 2014-02-14 ENCOUNTER — Encounter: Payer: BC Managed Care – PPO | Admitting: Family Medicine

## 2014-02-14 NOTE — Progress Notes (Signed)
error    This encounter was created in error - please disregard.

## 2014-02-18 ENCOUNTER — Other Ambulatory Visit: Payer: BC Managed Care – PPO

## 2014-02-24 ENCOUNTER — Other Ambulatory Visit (HOSPITAL_BASED_OUTPATIENT_CLINIC_OR_DEPARTMENT_OTHER): Payer: BC Managed Care – PPO

## 2014-02-24 ENCOUNTER — Encounter: Payer: Self-pay | Admitting: Physician Assistant

## 2014-02-24 ENCOUNTER — Telehealth: Payer: Self-pay | Admitting: Oncology

## 2014-02-24 ENCOUNTER — Ambulatory Visit (HOSPITAL_BASED_OUTPATIENT_CLINIC_OR_DEPARTMENT_OTHER): Payer: No Typology Code available for payment source

## 2014-02-24 ENCOUNTER — Ambulatory Visit (HOSPITAL_BASED_OUTPATIENT_CLINIC_OR_DEPARTMENT_OTHER): Payer: BC Managed Care – PPO | Admitting: Physician Assistant

## 2014-02-24 VITALS — BP 144/95 | HR 116 | Temp 98.7°F

## 2014-02-24 VITALS — BP 138/90 | HR 106 | Temp 98.8°F | Resp 18 | Ht 68.5 in | Wt 177.9 lb

## 2014-02-24 DIAGNOSIS — C50419 Malignant neoplasm of upper-outer quadrant of unspecified female breast: Secondary | ICD-10-CM

## 2014-02-24 DIAGNOSIS — I2699 Other pulmonary embolism without acute cor pulmonale: Secondary | ICD-10-CM

## 2014-02-24 DIAGNOSIS — G4700 Insomnia, unspecified: Secondary | ICD-10-CM

## 2014-02-24 DIAGNOSIS — Z170 Estrogen receptor positive status [ER+]: Secondary | ICD-10-CM

## 2014-02-24 DIAGNOSIS — C50919 Malignant neoplasm of unspecified site of unspecified female breast: Secondary | ICD-10-CM

## 2014-02-24 DIAGNOSIS — M2550 Pain in unspecified joint: Secondary | ICD-10-CM

## 2014-02-24 DIAGNOSIS — I82409 Acute embolism and thrombosis of unspecified deep veins of unspecified lower extremity: Secondary | ICD-10-CM

## 2014-02-24 DIAGNOSIS — Z5111 Encounter for antineoplastic chemotherapy: Secondary | ICD-10-CM

## 2014-02-24 DIAGNOSIS — J329 Chronic sinusitis, unspecified: Secondary | ICD-10-CM

## 2014-02-24 DIAGNOSIS — C50911 Malignant neoplasm of unspecified site of right female breast: Secondary | ICD-10-CM

## 2014-02-24 LAB — COMPREHENSIVE METABOLIC PANEL (CC13)
ALK PHOS: 118 U/L (ref 40–150)
ALT: 17 U/L (ref 0–55)
AST: 17 U/L (ref 5–34)
Albumin: 3.7 g/dL (ref 3.5–5.0)
Anion Gap: 11 mEq/L (ref 3–11)
BUN: 9.5 mg/dL (ref 7.0–26.0)
CALCIUM: 10.1 mg/dL (ref 8.4–10.4)
CO2: 27 mEq/L (ref 22–29)
Chloride: 106 mEq/L (ref 98–109)
Creatinine: 0.8 mg/dL (ref 0.6–1.1)
Glucose: 109 mg/dl (ref 70–140)
Potassium: 3.9 mEq/L (ref 3.5–5.1)
SODIUM: 144 meq/L (ref 136–145)
TOTAL PROTEIN: 7.3 g/dL (ref 6.4–8.3)
Total Bilirubin: 0.46 mg/dL (ref 0.20–1.20)

## 2014-02-24 LAB — CBC WITH DIFFERENTIAL/PLATELET
BASO%: 0.5 % (ref 0.0–2.0)
BASOS ABS: 0 10*3/uL (ref 0.0–0.1)
EOS%: 0.7 % (ref 0.0–7.0)
Eosinophils Absolute: 0 10*3/uL (ref 0.0–0.5)
HCT: 42 % (ref 34.8–46.6)
HGB: 14.3 g/dL (ref 11.6–15.9)
LYMPH%: 34.5 % (ref 14.0–49.7)
MCH: 34.1 pg — AB (ref 25.1–34.0)
MCHC: 34.1 g/dL (ref 31.5–36.0)
MCV: 100 fL (ref 79.5–101.0)
MONO#: 0.5 10*3/uL (ref 0.1–0.9)
MONO%: 8.1 % (ref 0.0–14.0)
NEUT%: 56.2 % (ref 38.4–76.8)
NEUTROS ABS: 3.7 10*3/uL (ref 1.5–6.5)
Platelets: 214 10*3/uL (ref 145–400)
RBC: 4.2 10*6/uL (ref 3.70–5.45)
RDW: 12.2 % (ref 11.2–14.5)
WBC: 6.6 10*3/uL (ref 3.9–10.3)
lymph#: 2.3 10*3/uL (ref 0.9–3.3)

## 2014-02-24 MED ORDER — GOSERELIN ACETATE 10.8 MG ~~LOC~~ IMPL
10.8000 mg | DRUG_IMPLANT | SUBCUTANEOUS | Status: DC
  Administered 2014-02-24: 10.8 mg via SUBCUTANEOUS
  Filled 2014-02-24: qty 10.8

## 2014-02-24 NOTE — Progress Notes (Addendum)
ID: Jenna Gonzales   DOB: [birth year]  MR#: [id number]  OEU#:[id number]  PCP: Lucretia Kern., DO GYN: [name], MD SU: Excell Seltzer, MD OTHER MD:  CHIEF COMPLAINTS:  1) Hx of Right Breast Cancer      2) DVT/PE    HISTORY OF PRESENT ILLNESS: The patient saw Dr. Garwin Brothers for a routine gynecologic evaluation, and Dr. Garwin Brothers did a breast exam which showed a palpable mass in the right breast.  She was referred for a diagnostic mammography 11/03/2009, and Dr. Claudie Revering was able to confirm a palpable mass in the 10 o'clock position of the right breast 7 cm from the nipple.  There was no palpable adenopathy.  By mammography the breasts were dense, but there was a spiculated mass in the upper outer right breast corresponding to the palpable mass by ultrasound as measured 2.1 cm.  It was hypoechoic, irregular, and poorly defined.  Ultrasound of the right axilla demonstrated several normal sized lymph nodes, several of them showing cortical thickening.    With this information, the patient was brought back on 11/19 for biopsy.  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  The lymph node biopsy obtained at the same time was negative.  The tumor was ER positive at 99%, PR positive at 20% with a low proliferation marker at 12%, and HER-2/neu was not amplified with a ratio of 1.45.  Bilateral breast MRIs were obtained 11/22.  This confirmed a 2.2 cm irregularly marginated enhancing mass in the upper outer right breast with a clip in the mass.  There were no other masses, and aside from post biopsy changes in the right axilla, there were no abnormal appearing lymph nodes.    The patient proceeded to right lumpectomy and sentinel lymph node sampling 12/09 under Dr. Excell Seltzer because the two sentinel lymph nodes provided positive for cancer.  Dr. Excell Seltzer proceeded to a right axillary lymph node dissection in the same procedure.  The final  pathology [id number]) showed an invasive ductal carcinoma, grade 1, with negative although close margins (the closest was 1 mm posteriorly), with evidence of lymphovascular invasion and involving 2 out of 17 lymph nodes sampled (the 2 sentinel lymph nodes). Her subsequent history is as detailed below.   INTERVAL HISTORY: Jenna Gonzales returns alone today for followup of her right breast cancer. As a brief recap, Jenna Gonzales was on tamoxifen between July of 2011 and October 2014 when she was diagnosed with a right lower extremity DVT and PE, and tamoxifen was immediately discontinued. She continues on anti-coagulation therapy, taking Xarelto, 20 mg daily.  At that time, Jenna Gonzales was still premenopausal. She was subsequently started on every 3 month goserelin injections along with daily anastrozole.  She is here today for her next injection.  Overall Jenna Gonzales is doing well. She has some hot flashes but does not find them particularly problematic. She denies any vaginal dryness, and has had no abnormal vaginal bleeding. She did have some increased joint pain, especially in her lower extremities, after starting the anastrozole. These have improved significantly, and she would not consider them to be a problem at this point.  She has now been on anti-coagulation therapy for 5 months, specifically 20 mg of Xarelto daily. She denies any abnormal bruising and has had no signs of abnormal bleeding.   REVIEW OF SYSTEMS: Jenna Gonzales has had no recent illnesses and denies any fevers. She does have some insomnia and also feels tired during the day. She  feels anxious but denies depression or suicidal ideation. Her appetite is reduced, but she denies any actual nausea or vomiting. She has a history of  IBS, and her bowels alternate somewhat between slight diarrhea and slight constipation. This is stable. She denies any increased cough, phlegm production, or pleurisy. She has some occasional shortness of breath only with exertion, and this has not  worsened. She continues to have quite a bit of sinus congestion which has become a chronic problem. She has not yet been seen by an ENT for this issue. She does have some headache she associates with her sinus congestion. These come and go and are "not bad". She denies any change in vision and has had no dizziness. She currently denies any new or unusual myalgias, arthralgias, bony pain, or peripheral swelling.  A detailed review of systems is otherwise stable and noncontributory.   PAST MEDICAL HISTORY: Past Medical History  Diagnosis Date  . Cellulitis 11/29/2011  . History of breast cancer   . Allergy   . Cancer 12/23/2008    Breast Cancer R; s/p lumpectomy with radiation, chemottherapy  . Hypertension   . Pulmonary embolus   . DVT (deep venous thrombosis)   . GERD (gastroesophageal reflux disease)   . Hypertension   Mild GERD, history of a heart murmur, which was not appreciated today, history of borderline hypertension not under treatment, history of irritable bowel disease, and history of minimal arthritic changes involving particularly the index finger of the right hand.  PAST SURGICAL HISTORY: Past Surgical History  Procedure Laterality Date  . Breast lumpectomy  11/2009    right; s/p radiation, chemotherapy    FAMILY HISTORY Family History  Problem Relation Age of Onset  . Leukemia Father   . Arthritis    . Heart disease    . Hypertension    . Kidney disease    . Diabetes    . Diabetes Other   The patient's father died from leukemia at the age of 48 (I cannot tell from the history if this was chronic or acute).  The patient's mother is alive at age 20.  The patient has 2 sisters and 1 brother, all here in [place].  She also has 2 half brothers living in [place].  There is no history of breast or ovarian cancer in the family.  GYNECOLOGIC HISTORY: (Updated 02/24/2014) She is GX, P1.  First pregnancy to term age 35.  Last menstrual period was in March 2011 with the  initiation of chemotherapy. Hormone levels drawn in October 2014 showed patient to  be premenopausal.   SOCIAL HISTORY:    (Updated 02/24/2014) The patient previously worked at [employer] in [employer].  She worked for the [employer] for 8 years.  She is single. Her son Dahrahn lives with her.  He "could never finish school," and does not have a job at present.  He does have 2 daughters who live with their mother here in [place].  The patient attends [employer].    ADVANCED DIRECTIVES:  HEALTH MAINTENANCE: (Updated 02/24/2014) History  Substance Use Topics  . Smoking status: Never Smoker   . Smokeless tobacco: Never Used  . Alcohol Use: Yes     Comment: socially     Colonoscopy: Never  PAP: UTD/Dr. Garwin Brothers  Bone density: Never/Scheduled for 03/11/2014  Lipid panel: Not on file   No Known Allergies  Current Outpatient Prescriptions  Medication Sig Dispense Refill  . anastrozole (ARIMIDEX) 1 MG  tablet Take 1 tablet (1 mg total) by mouth daily. Begin 1 day after first Zoladex injection.  30 tablet  5  . cetirizine (ZYRTEC) 10 MG tablet Take 10 mg by mouth daily as needed for allergies.      . fluticasone (FLONASE) 50 MCG/ACT nasal spray 1 spray in each nostril twice daily  16 g  2  . Rivaroxaban (XARELTO) 20 MG TABS tablet Take 1 tablet (20 mg total) by mouth daily with supper.  30 tablet  3   No current facility-administered medications for this visit.   Facility-Administered Medications Ordered in Other Visits  Medication Dose Route Frequency Provider Last Rate Last Dose  . goserelin (ZOLADEX) injection 10.8 mg  10.8 mg Subcutaneous Q90 days Theotis Burrow, PA-C   10.8 mg at 02/24/14 3662    OBJECTIVE: Young African American woman who appears well and is in no acute distress. Filed Vitals:   02/24/14 0858  BP: 138/90  Pulse: 106  Temp: 98.8 F (37.1 C)  Resp: 18     Body mass index is 26.65 kg/(m^2).    ECOG FS: 0 Filed Weights    02/24/14 0858  Weight: 177 lb 14.4 oz (80.695 kg)   Physical Exam: HEENT:  Sclerae anicteric.  Oropharynx clear and moist. No ulcerations or mucositis. No candidiasis. Neck supple with trachea midline  NODES:  No cervical or supraclavicular lymphadenopathy palpated.  BREAST EXAM:  Right breast is status post lumpectomy with no  nodularities or skin changes. No evidence of local recurrence. Left breast is unremarkable.  Axillae are benign bilaterally for palpable lymphadenopathy. LUNGS:  Clear to auscultation bilaterally with good excursion.  No wheezes, rales, or rhonchi.  HEART:  Regular rate and rhythm.  ABDOMEN:  Soft, nontender. No organomegaly or masses palpated.  Positive bowel sounds.  MSK:  No focal spinal tenderness to palpation. Full range of motion in the upper extremities.  EXTREMITIES:  No peripheral edema. No lymphedema noted in the upper extremities. NEURO:  Nonfocal. Well oriented.  Anxious affect. SKIN: Clear with no obvious rashes or skin lesions. No excessive ecchymoses. No petechiae.     LAB RESULTS: Lab Results  Component Value Date   WBC 6.6 02/24/2014   NEUTROABS 3.7 02/24/2014   HGB 14.3 02/24/2014   HCT 42.0 02/24/2014   MCV 100.0 02/24/2014   PLT 214 02/24/2014      Chemistry      Component Value Date/Time   NA 144 02/24/2014 0819   NA 139 09/23/2013 0640   K 3.9 02/24/2014 0819   K 3.6 09/23/2013 0640   CL 104 09/23/2013 0640   CL 106 01/19/2013 1507   CO2 27 02/24/2014 0819   CO2 24 09/23/2013 0640   BUN 9.5 02/24/2014 0819   BUN 10 09/23/2013 0640   CREATININE 0.8 02/24/2014 0819   CREATININE 0.81 09/23/2013 0640      Component Value Date/Time   CALCIUM 10.1 02/24/2014 0819   CALCIUM 9.2 09/23/2013 0640   ALKPHOS 118 02/24/2014 0819   ALKPHOS 80 07/16/2012 1406   AST 17 02/24/2014 0819   AST 18 07/16/2012 1406   ALT 17 02/24/2014 0819   ALT 19 07/16/2012 1406   BILITOT 0.46 02/24/2014 0819   BILITOT 0.4 07/16/2012 1406       STUDIES:  Patient's most recent bilateral  mammogram was on 01/04/2013 and was unremarkable. This is scheduled to be repeated along with a baseline bone density in March 2015.     ASSESSMENT: 43 y.o.  [place] woman   (1)  status post right lumpectomy and axillary lymph node dissection December of 2010 for a T2 N1, stage IIB invasive ductal carcinoma, grade 1, strongly estrogen receptor positive, moderately progesterone receptor positive, HER-2 negative, with a low proliferation fraction;   (2)  treated adjuvantly with 6 cycles of docetaxel/doxorubicin/cyclophosphamide, completed in May of 2011,   (3)   followed by radiation completed July of 2011 at which time she started tamoxifen.  (4)  tamoxifen was discontinued in October 2014 at which time patient was diagnosed with a right lower extremity DVT and pulmonary embolism.  (5)  anti-coagulation therapy with Xarelto, beginning 09/23/2013  (6)  labs drawn in October 2014 confirmed a premenopausal status. Patient received her first Q3 month injection of goserelin on 12/02/2013, and then began on anastrozole at 1 mg daily.    PLAN: With regards to her breast cancer, Jenna Gonzales seems to be doing well, and there is no clinical evidence of disease recurrence. She's tolerating this regimen well, and I'm currently making no changes. She will continue on the anastrozole daily, and receive her next q. 3 month injection of goserelin.  She is due for her next injection on May 28, then again on August 20 at which time she will see Dr. Jana Hakim for routine followup.  In the meanwhile, as noted above, she is due for her next annual mammogram as well as a baseline bone density later this month on 03/11/2014.  This case was reviewed with Dr. Jana Hakim. With regards to her anticoagulation and her history of PE/DVT, she will complete 6 months of anticoagulation in early April. Accordingly she will continue on Xarelto 20 mg daily until April 1, and consent discontinue that drug. Approximately one month later we  will check D dimer for further assessment.  Of course chem knows what to look for with regards to additional clotting. Specifically she knows to contact us immediately with any peripheral swelling, or reports the emergency room with any acute shortness of breath.  I will mention that I also suggested that Jenna Gonzales seek a consultation with ENT to evaluate her chronic sinusitis. She will let me know if she decides she wants a referral.  This is also being followed by her new primary care physician, Dr. Maudie Gonzales.  All the above was reviewed with the patient today.  Kim voices understanding and agreement with our plan, and will call with any changes or problems.   Clair Alfieri PA-C   02/24/2014   ADDENDUM: Alayasia has done very well with her anticoagulation and we will discontinue that at the 6 month point. We will check a d-dimer a month later just to make sure there is no significant risk from discontinuing the therapy. She has a good understanding of the overall plan and agrees with that. She will call with any problems that may develop before her next visit here.   Chauncey Cruel, MD

## 2014-03-11 ENCOUNTER — Other Ambulatory Visit: Payer: BC Managed Care – PPO

## 2014-03-25 ENCOUNTER — Other Ambulatory Visit: Payer: BC Managed Care – PPO

## 2014-03-30 ENCOUNTER — Other Ambulatory Visit: Payer: Self-pay | Admitting: Physician Assistant

## 2014-03-30 DIAGNOSIS — Z853 Personal history of malignant neoplasm of breast: Secondary | ICD-10-CM

## 2014-04-01 ENCOUNTER — Other Ambulatory Visit: Payer: BC Managed Care – PPO

## 2014-04-08 ENCOUNTER — Other Ambulatory Visit: Payer: BC Managed Care – PPO

## 2014-04-13 ENCOUNTER — Other Ambulatory Visit: Payer: BC Managed Care – PPO

## 2014-04-15 ENCOUNTER — Ambulatory Visit (INDEPENDENT_AMBULATORY_CARE_PROVIDER_SITE_OTHER): Payer: No Typology Code available for payment source | Admitting: Family Medicine

## 2014-04-15 ENCOUNTER — Encounter: Payer: Self-pay | Admitting: Family Medicine

## 2014-04-15 VITALS — BP 132/82 | HR 88 | Temp 98.3°F | Ht 68.5 in | Wt 176.0 lb

## 2014-04-15 DIAGNOSIS — J329 Chronic sinusitis, unspecified: Secondary | ICD-10-CM

## 2014-04-15 DIAGNOSIS — I10 Essential (primary) hypertension: Secondary | ICD-10-CM

## 2014-04-15 DIAGNOSIS — Z634 Disappearance and death of family member: Secondary | ICD-10-CM

## 2014-04-15 DIAGNOSIS — J309 Allergic rhinitis, unspecified: Secondary | ICD-10-CM

## 2014-04-15 MED ORDER — AMOXICILLIN-POT CLAVULANATE 875-125 MG PO TABS: 1.0000 | ORAL_TABLET | Freq: Two times a day (BID) | ORAL | Status: DC

## 2014-04-15 MED ORDER — FLUTICASONE PROPIONATE 50 MCG/ACT NA SUSP: NASAL | Status: DC

## 2014-04-15 NOTE — Progress Notes (Signed)
No chief complaint on file.   HPI:  Sinus issues: -started: 1.5 weeks ago -symptoms:nasal congestion, sore throat, cough, sinus pressure, PND, L max tooth pain -denies:fever, SOB, NVD -has tried: flonase and zyrtec - then switched to claritin -sick contacts/travel/risks: denies flu exposure, tick exposure or Ebola risks -Hx of: allergies - and told to do flonase and zyrtec last visit and follow up in 1 month (has been 4 months); treated with two courses of abx in Jan for sinusitis and advised to see ENT if persists or recurred as has chronic symptoms of sinusitis  HTN: -was on BB at first visit with me in Jan and she wanted to stop the medication -was advised to keep log and follow up in 1 month  -reports: has been keeping log and BP ranges on log are in 1teens - 120s/80s on average -denies: CP, SOB, palpitations  Depression: -friend died of breast cancer at age 9 -reports 4 acquaintances passed in last few month -mild stress and anxiety about this  ROS: See pertinent positives and negatives per HPI.  Past Medical History  Diagnosis Date  . Cellulitis 11/29/2011  . History of breast cancer   . Allergy   . Cancer 12/23/2008    Breast Cancer R; s/p lumpectomy with radiation, chemottherapy  . Hypertension   . Pulmonary embolus   . DVT (deep venous thrombosis)   . GERD (gastroesophageal reflux disease)   . Hypertension     Past Surgical History  Procedure Laterality Date  . Breast lumpectomy  11/2009    right; s/p radiation, chemotherapy    Family History  Problem Relation Age of Onset  . Leukemia Father   . Arthritis    . Heart disease    . Hypertension    . Kidney disease    . Diabetes    . Diabetes Other     History   Social History  . Marital Status: Single    Spouse Name: N/A    Number of Children: N/A  . Years of Education: N/A   Social History Main Topics  . Smoking status: Never Smoker   . Smokeless tobacco: Never Used  . Alcohol Use: Yes   Comment: socially  . Drug Use: No  . Sexual Activity: Yes    Birth Control/ Protection: Injection   Other Topics Concern  . 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      Home Situation: lives alone      Spiritual Beliefs: none      Lifestyle: going to start exercise - diet is ok             Current outpatient prescriptions:anastrozole (ARIMIDEX) 1 MG tablet, Take 1 tablet (1 mg total) by mouth daily. Begin 1 day after first Zoladex injection., Disp: 30 tablet, Rfl: 5;  cetirizine (ZYRTEC) 10 MG tablet, Take 10 mg by mouth daily as needed for allergies., Disp: , Rfl: ;  fluticasone (FLONASE) 50 MCG/ACT nasal spray, 1 spray in each nostril twice daily, Disp: 16 g, Rfl: 6 Rivaroxaban (XARELTO) 20 MG TABS tablet, Take 1 tablet (20 mg total) by mouth daily with supper., Disp: 30 tablet, Rfl: 3;  amoxicillin-clavulanate (AUGMENTIN) 875-125 MG per tablet, Take 1 tablet by mouth 2 (two) times daily., Disp: 20 tablet, Rfl: 0  EXAM:  Filed Vitals:   04/15/14 1039  BP: 132/82  Pulse: 88  Temp: 98.3 F (36.8 C)  Body mass index is 26.37 kg/(m^2).  GENERAL: vitals reviewed and listed above, alert, oriented, appears well hydrated and in no acute distress  HEENT: atraumatic, conjunttiva clear, no obvious abnormalities on inspection of external nose and ears, normal appearance of ear canals and TMs, clear nasal congestion, mild post oropharyngeal erythema with PND, no tonsillar edema or exudate, no sinus TTP  NECK: no obvious masses on inspection  LUNGS: clear to auscultation bilaterally, no wheezes, rales or rhonchi, good air movement  CV: HRRR, no peripheral edema  MS: moves all extremities without noticeable abnormality  PSYCH: pleasant and cooperative, anxious  ASSESSMENT AND PLAN:  Discussed the following assessment and plan:  Sinusitis - Plan: amoxicillin-clavulanate (AUGMENTIN) 875-125 MG per tablet -recurrent,  referred to ENT, abx if worsening pr persists, continue daily ics and antihistamine  Recurrent sinusitis - Plan: Ambulatory referral to ENT  Bereavement -disucssed options, she does not want to do medication - discussed counseling including hospice and palliative care of [place], medications, etc -no thoughts of self harm, follow up immediately if worsening, and in 2 months  HTN (hypertension) -home log and repeat after sitting ok -follow  Allergic rhinitis - Plan: fluticasone (FLONASE) 50 MCG/ACT nasal spray  Toe pain: -for three days after stubbing, will follow, exam normal  -of course, we advised to return or notify a doctor immediately if symptoms worsen or persist or new concerns arise.    Patient Instructions  -schedule counseling  -We placed a referral for you as discussed to the ear, nose and throat doctor. It usually takes about 1-2 weeks to process and schedule this referral. If you have not heard from [place] regarding this appointment in 2 weeks please contact our office.  -follow up in 1 month        Lucretia Kern

## 2014-04-15 NOTE — Progress Notes (Signed)
Pre visit review using our clinic review tool, if applicable. No additional management support is needed unless otherwise documented below in the visit note. 

## 2014-04-15 NOTE — Patient Instructions (Signed)
-  schedule counseling  -We placed a referral for you as discussed to the ear, nose and throat doctor. It usually takes about 1-2 weeks to process and schedule this referral. If you have not heard from [place] regarding this appointment in 2 weeks please contact our office.  -follow up in 1 month

## 2014-04-22 ENCOUNTER — Other Ambulatory Visit (HOSPITAL_BASED_OUTPATIENT_CLINIC_OR_DEPARTMENT_OTHER): Payer: No Typology Code available for payment source

## 2014-04-22 DIAGNOSIS — I2699 Other pulmonary embolism without acute cor pulmonale: Secondary | ICD-10-CM

## 2014-04-22 DIAGNOSIS — C50419 Malignant neoplasm of upper-outer quadrant of unspecified female breast: Secondary | ICD-10-CM

## 2014-04-22 DIAGNOSIS — C50919 Malignant neoplasm of unspecified site of unspecified female breast: Secondary | ICD-10-CM

## 2014-04-22 DIAGNOSIS — I82409 Acute embolism and thrombosis of unspecified deep veins of unspecified lower extremity: Secondary | ICD-10-CM

## 2014-04-22 LAB — COMPREHENSIVE METABOLIC PANEL (CC13)
ALK PHOS: 130 U/L (ref 40–150)
ALT: 18 U/L (ref 0–55)
ANION GAP: 13 meq/L — AB (ref 3–11)
AST: 17 U/L (ref 5–34)
Albumin: 4.2 g/dL (ref 3.5–5.0)
BILIRUBIN TOTAL: 0.37 mg/dL (ref 0.20–1.20)
BUN: 12.4 mg/dL (ref 7.0–26.0)
CO2: 27 mEq/L (ref 22–29)
CREATININE: 1 mg/dL (ref 0.6–1.1)
Calcium: 11 mg/dL — ABNORMAL HIGH (ref 8.4–10.4)
Chloride: 104 mEq/L (ref 98–109)
Glucose: 97 mg/dl (ref 70–140)
Potassium: 3.8 mEq/L (ref 3.5–5.1)
Sodium: 143 mEq/L (ref 136–145)
Total Protein: 8.3 g/dL (ref 6.4–8.3)

## 2014-04-22 LAB — CBC WITH DIFFERENTIAL/PLATELET
BASO%: 0.3 % (ref 0.0–2.0)
Basophils Absolute: 0 10*3/uL (ref 0.0–0.1)
EOS ABS: 0 10*3/uL (ref 0.0–0.5)
EOS%: 0.1 % (ref 0.0–7.0)
HCT: 45.7 % (ref 34.8–46.6)
HGB: 15.2 g/dL (ref 11.6–15.9)
LYMPH%: 24.1 % (ref 14.0–49.7)
MCH: 33.6 pg (ref 25.1–34.0)
MCHC: 33.3 g/dL (ref 31.5–36.0)
MCV: 100.9 fL (ref 79.5–101.0)
MONO#: 0.4 10*3/uL (ref 0.1–0.9)
MONO%: 5.3 % (ref 0.0–14.0)
NEUT%: 70.2 % (ref 38.4–76.8)
NEUTROS ABS: 5.5 10*3/uL (ref 1.5–6.5)
PLATELETS: 226 10*3/uL (ref 145–400)
RBC: 4.53 10*6/uL (ref 3.70–5.45)
RDW: 12.2 % (ref 11.2–14.5)
WBC: 7.8 10*3/uL (ref 3.9–10.3)
lymph#: 1.9 10*3/uL (ref 0.9–3.3)

## 2014-04-25 LAB — D-DIMER, QUANTITATIVE (NOT AT ARMC): D DIMER QUANT: 0.42 ug{FEU}/mL (ref 0.00–0.48)

## 2014-05-03 ENCOUNTER — Encounter: Payer: Self-pay | Admitting: Oncology

## 2014-05-03 NOTE — Progress Notes (Signed)
FAXED (REFAXED) PA REQUEST TO [facility] 29 PAGES TO [phone] FOR HER GOSERELIN (ZOMETA) INJECTIONS TO [facility]ENTRY [phone]. THEIR PHONE NUMBER IS [phone].

## 2014-05-17 ENCOUNTER — Ambulatory Visit
Admission: RE | Admit: 2014-05-17 | Discharge: 2014-05-17 | Disposition: A | Discharge status: Home / Self Care | Payer: No Typology Code available for payment source | Source: Ambulatory Visit | Attending: Physician Assistant | Admitting: Physician Assistant

## 2014-05-17 DIAGNOSIS — Z853 Personal history of malignant neoplasm of breast: Secondary | ICD-10-CM

## 2014-05-18 ENCOUNTER — Other Ambulatory Visit: Payer: Self-pay | Admitting: Physician Assistant

## 2014-05-18 DIAGNOSIS — C50919 Malignant neoplasm of unspecified site of unspecified female breast: Secondary | ICD-10-CM

## 2014-05-19 ENCOUNTER — Other Ambulatory Visit (HOSPITAL_BASED_OUTPATIENT_CLINIC_OR_DEPARTMENT_OTHER): Payer: No Typology Code available for payment source

## 2014-05-19 ENCOUNTER — Ambulatory Visit (HOSPITAL_BASED_OUTPATIENT_CLINIC_OR_DEPARTMENT_OTHER): Payer: No Typology Code available for payment source

## 2014-05-19 VITALS — BP 151/98 | HR 95 | Temp 98.1°F

## 2014-05-19 DIAGNOSIS — C50919 Malignant neoplasm of unspecified site of unspecified female breast: Secondary | ICD-10-CM

## 2014-05-19 DIAGNOSIS — C50419 Malignant neoplasm of upper-outer quadrant of unspecified female breast: Secondary | ICD-10-CM

## 2014-05-19 DIAGNOSIS — Z5111 Encounter for antineoplastic chemotherapy: Secondary | ICD-10-CM

## 2014-05-19 LAB — COMPREHENSIVE METABOLIC PANEL (CC13)
ALT: 15 U/L (ref 0–55)
AST: 16 U/L (ref 5–34)
Albumin: 3.9 g/dL (ref 3.5–5.0)
Alkaline Phosphatase: 126 U/L (ref 40–150)
Anion Gap: 14 mEq/L — ABNORMAL HIGH (ref 3–11)
BUN: 12.5 mg/dL (ref 7.0–26.0)
CALCIUM: 10.5 mg/dL — AB (ref 8.4–10.4)
CHLORIDE: 104 meq/L (ref 98–109)
CO2: 24 mEq/L (ref 22–29)
Creatinine: 0.9 mg/dL (ref 0.6–1.1)
Glucose: 98 mg/dl (ref 70–140)
Potassium: 3.9 mEq/L (ref 3.5–5.1)
Sodium: 142 mEq/L (ref 136–145)
Total Bilirubin: 0.57 mg/dL (ref 0.20–1.20)
Total Protein: 8.1 g/dL (ref 6.4–8.3)

## 2014-05-19 LAB — CBC WITH DIFFERENTIAL/PLATELET
BASO%: 0.7 % (ref 0.0–2.0)
Basophils Absolute: 0 10*3/uL (ref 0.0–0.1)
EOS%: 0.5 % (ref 0.0–7.0)
Eosinophils Absolute: 0 10*3/uL (ref 0.0–0.5)
HEMATOCRIT: 44.9 % (ref 34.8–46.6)
HGB: 15.1 g/dL (ref 11.6–15.9)
LYMPH#: 1.7 10*3/uL (ref 0.9–3.3)
LYMPH%: 26.8 % (ref 14.0–49.7)
MCH: 33.9 pg (ref 25.1–34.0)
MCHC: 33.6 g/dL (ref 31.5–36.0)
MCV: 100.8 fL (ref 79.5–101.0)
MONO#: 0.5 10*3/uL (ref 0.1–0.9)
MONO%: 7.4 % (ref 0.0–14.0)
NEUT#: 4 10*3/uL (ref 1.5–6.5)
NEUT%: 64.6 % (ref 38.4–76.8)
PLATELETS: 219 10*3/uL (ref 145–400)
RBC: 4.45 10*6/uL (ref 3.70–5.45)
RDW: 12.3 % (ref 11.2–14.5)
WBC: 6.2 10*3/uL (ref 3.9–10.3)

## 2014-05-19 MED ORDER — GOSERELIN ACETATE 10.8 MG ~~LOC~~ IMPL
10.8000 mg | DRUG_IMPLANT | SUBCUTANEOUS | Status: DC
  Administered 2014-05-19: 10.8 mg via SUBCUTANEOUS
  Filled 2014-05-19: qty 10.8

## 2014-05-19 NOTE — Patient Instructions (Signed)
Goserelin injection What is this medicine? GOSERELIN (GOE se rel in) is similar to a hormone found in the body. It lowers the amount of sex hormones that the body makes. Men will have lower testosterone levels and women will have lower estrogen levels while taking this medicine. In men, this medicine is used to treat prostate cancer; the injection is either given once per month or once every 12 weeks. A once per month injection (only) is used to treat women with endometriosis, dysfunctional uterine bleeding, or advanced breast cancer. This medicine may be used for other purposes; ask your health care provider or pharmacist if you have questions. COMMON BRAND NAME(S): Zoladex What should I tell my health care provider before I take this medicine? They need to know if you have any of these conditions (some only apply to women): -diabetes -heart disease or previous heart attack -high blood pressure -high cholesterol -kidney disease -osteoporosis or low bone density -problems passing urine -spinal cord injury -stroke -tobacco smoker -an unusual or allergic reaction to goserelin, hormone therapy, other medicines, foods, dyes, or preservatives -pregnant or trying to get pregnant -breast-feeding How should I use this medicine? This medicine is for injection under the skin. It is given by a health care professional in a hospital or clinic setting. Men receive this injection once every 4 weeks or once every 12 weeks. Women will only receive the once every 4 weeks injection. Talk to your pediatrician regarding the use of this medicine in children. Special care may be needed. Overdosage: If you think you have taken too much of this medicine contact a poison control center or emergency room at once. NOTE: This medicine is only for you. Do not share this medicine with others. What if I miss a dose? It is important not to miss your dose. Call your doctor or health care professional if you are unable to  keep an appointment. What may interact with this medicine? -female hormones like estrogen -herbal or dietary supplements like black cohosh, chasteberry, or DHEA -female hormones like testosterone -prasterone This list may not describe all possible interactions. Give your health care provider a list of all the medicines, herbs, non-prescription drugs, or dietary supplements you use. Also tell them if you smoke, drink alcohol, or use illegal drugs. Some items may interact with your medicine. What should I watch for while using this medicine? Visit your doctor or health care professional for regular checks on your progress. Your symptoms may appear to get worse during the first weeks of this therapy. Tell your doctor or healthcare professional if your symptoms do not start to get better or if they get worse after this time. Your bones may get weaker if you take this medicine for a long time. If you smoke or frequently drink alcohol you may increase your risk of bone loss. A family history of osteoporosis, chronic use of drugs for seizures (convulsions), or corticosteroids can also increase your risk of bone loss. Talk to your doctor about how to keep your bones strong. This medicine should stop regular monthly menstration in women. Tell your doctor if you continue to menstrate. Women should not become pregnant while taking this medicine or for 12 weeks after stopping this medicine. Women should inform their doctor if they wish to become pregnant or think they might be pregnant. There is a potential for serious side effects to an unborn child. Talk to your health care professional or pharmacist for more information. Do not breast-feed an infant while taking   this medicine. Men should inform their doctors if they wish to father a child. This medicine may lower sperm counts. Talk to your health care professional or pharmacist for more information. What side effects may I notice from receiving this  medicine? Side effects that you should report to your doctor or health care professional as soon as possible: -allergic reactions like skin rash, itching or hives, swelling of the face, lips, or tongue -bone pain -breathing problems -changes in vision -chest pain -feeling faint or lightheaded, falls -fever, chills -pain, swelling, warmth in the leg -pain, tingling, numbness in the hands or feet -swelling of the ankles, feet, hands -trouble passing urine or change in the amount of urine -unusually high or low blood pressure -unusually weak or tired Side effects that usually do not require medical attention (report to your doctor or health care professional if they continue or are bothersome): -change in sex drive or performance -changes in breast size in both males and females -changes in emotions or moods -headache -hot flashes -irritation at site where injected -loss of appetite -skin problems like acne, dry skin -vaginal dryness This list may not describe all possible side effects. Call your doctor for medical advice about side effects. You may report side effects to FDA at 1-800-FDA-1088. Where should I keep my medicine? This drug is given in a hospital or clinic and will not be stored at home. NOTE: This sheet is a summary. It may not cover all possible information. If you have questions about this medicine, talk to your doctor, pharmacist, or health care provider.  2014, [employer] Standard. (2009-04-25 13:28:29)  

## 2014-05-31 ENCOUNTER — Other Ambulatory Visit: Payer: Self-pay | Admitting: Physician Assistant

## 2014-05-31 DIAGNOSIS — C50219 Malignant neoplasm of upper-inner quadrant of unspecified female breast: Secondary | ICD-10-CM

## 2014-06-09 ENCOUNTER — Emergency Department (HOSPITAL_COMMUNITY)
Admission: EM | Admit: 2014-06-09 | Discharge: 2014-06-09 | Disposition: A | Discharge status: Home / Self Care | Payer: No Typology Code available for payment source | Attending: Emergency Medicine | Admitting: Emergency Medicine

## 2014-06-09 ENCOUNTER — Encounter (HOSPITAL_COMMUNITY): Payer: Self-pay | Admitting: Emergency Medicine

## 2014-06-09 DIAGNOSIS — Z872 Personal history of diseases of the skin and subcutaneous tissue: Secondary | ICD-10-CM | POA: Insufficient documentation

## 2014-06-09 DIAGNOSIS — I10 Essential (primary) hypertension: Secondary | ICD-10-CM | POA: Insufficient documentation

## 2014-06-09 DIAGNOSIS — Z86718 Personal history of other venous thrombosis and embolism: Secondary | ICD-10-CM | POA: Insufficient documentation

## 2014-06-09 DIAGNOSIS — R000 Tachycardia, unspecified: Secondary | ICD-10-CM | POA: Insufficient documentation

## 2014-06-09 DIAGNOSIS — Z9221 Personal history of antineoplastic chemotherapy: Secondary | ICD-10-CM | POA: Insufficient documentation

## 2014-06-09 DIAGNOSIS — F419 Anxiety disorder, unspecified: Secondary | ICD-10-CM

## 2014-06-09 DIAGNOSIS — T148XXA Other injury of unspecified body region, initial encounter: Secondary | ICD-10-CM

## 2014-06-09 DIAGNOSIS — Z8719 Personal history of other diseases of the digestive system: Secondary | ICD-10-CM | POA: Insufficient documentation

## 2014-06-09 DIAGNOSIS — F411 Generalized anxiety disorder: Secondary | ICD-10-CM | POA: Insufficient documentation

## 2014-06-09 DIAGNOSIS — M7981 Nontraumatic hematoma of soft tissue: Secondary | ICD-10-CM | POA: Insufficient documentation

## 2014-06-09 DIAGNOSIS — Z86711 Personal history of pulmonary embolism: Secondary | ICD-10-CM | POA: Insufficient documentation

## 2014-06-09 DIAGNOSIS — M79609 Pain in unspecified limb: Secondary | ICD-10-CM

## 2014-06-09 DIAGNOSIS — Z923 Personal history of irradiation: Secondary | ICD-10-CM | POA: Insufficient documentation

## 2014-06-09 DIAGNOSIS — Z853 Personal history of malignant neoplasm of breast: Secondary | ICD-10-CM | POA: Insufficient documentation

## 2014-06-09 DIAGNOSIS — Z79899 Other long term (current) drug therapy: Secondary | ICD-10-CM | POA: Insufficient documentation

## 2014-06-09 LAB — CBC WITH DIFFERENTIAL/PLATELET
BASOS ABS: 0 10*3/uL (ref 0.0–0.1)
Basophils Relative: 0 % (ref 0–1)
Eosinophils Absolute: 0 10*3/uL (ref 0.0–0.7)
Eosinophils Relative: 0 % (ref 0–5)
HCT: 44.9 % (ref 36.0–46.0)
Hemoglobin: 15.4 g/dL — ABNORMAL HIGH (ref 12.0–15.0)
LYMPHS PCT: 14 % (ref 12–46)
Lymphs Abs: 1.1 10*3/uL (ref 0.7–4.0)
MCH: 34 pg (ref 26.0–34.0)
MCHC: 34.3 g/dL (ref 30.0–36.0)
MCV: 99.1 fL (ref 78.0–100.0)
MONO ABS: 0.3 10*3/uL (ref 0.1–1.0)
MONOS PCT: 4 % (ref 3–12)
Neutro Abs: 5.9 10*3/uL (ref 1.7–7.7)
Neutrophils Relative %: 82 % — ABNORMAL HIGH (ref 43–77)
Platelets: 211 10*3/uL (ref 150–400)
RBC: 4.53 MIL/uL (ref 3.87–5.11)
RDW: 11.8 % (ref 11.5–15.5)
WBC: 7.3 10*3/uL (ref 4.0–10.5)

## 2014-06-09 LAB — BASIC METABOLIC PANEL
BUN: 12 mg/dL (ref 6–23)
CO2: 26 meq/L (ref 19–32)
CREATININE: 0.85 mg/dL (ref 0.50–1.10)
Calcium: 11 mg/dL — ABNORMAL HIGH (ref 8.4–10.5)
Chloride: 102 mEq/L (ref 96–112)
GFR calc Af Amer: >90 mL/min (ref >90)
GFR calc non Af Amer: 82 mL/min — ABNORMAL LOW (ref >90)
Glucose, Bld: 113 mg/dL — ABNORMAL HIGH (ref 70–99)
Potassium: 4.2 mEq/L (ref 3.7–5.3)
Sodium: 144 mEq/L (ref 137–147)

## 2014-06-09 LAB — PROTIME-INR
INR: 0.91 (ref 0.00–1.49)
Prothrombin Time: 12.1 seconds (ref 11.6–15.2)

## 2014-06-09 MED ORDER — LORAZEPAM 1 MG PO TABS: 1.0000 mg | ORAL_TABLET | Freq: Three times a day (TID) | ORAL | Status: DC | PRN

## 2014-06-09 MED ORDER — LORAZEPAM 1 MG PO TABS
1.0000 mg | ORAL_TABLET | Freq: Once | ORAL | Status: AC
  Administered 2014-06-09: 1 mg via ORAL
  Filled 2014-06-09: qty 1

## 2014-06-09 NOTE — Discharge Instructions (Signed)

## 2014-06-09 NOTE — ED Notes (Signed)
Today noticed lower thigh with bruising on outer leg, lower calf cramping intermittently, and bil ankle edema.  Patient reports a high level of anxiety.  Tired of feeling this way, oncologist took her off of xeralto in March.

## 2014-06-09 NOTE — ED Notes (Signed)
Patient transported to [place] on stretcher with transport.

## 2014-06-09 NOTE — ED Notes (Signed)
Alert and oriented, able to verbalize discharge instructions, leaving with a friend as her driver.

## 2014-06-09 NOTE — ED Provider Notes (Signed)
Medical screening examination/treatment/procedure(s) were performed by non-physician practitioner and as supervising physician I was immediately available for consultation/collaboration.    Dot Lanes, MD 06/09/14 [phone]

## 2014-06-09 NOTE — ED Provider Notes (Signed)
CSN: [id number]     Arrival date & time 06/09/14  1110 History   First MD Initiated Contact with Patient 06/09/14 1114     Chief Complaint  Patient presents with  . Bleeding/Bruising     (Consider location/radiation/quality/duration/timing/severity/associated sxs/prior Treatment) HPI  Patient to the ER with complaints of bruising to the right leg. She has a history breast cancer, DVT,  Hypertension, cellulitis, PE, and GERD. She was on Tamoxifen which they believed caused his blood clots. She know takes Anastrozole. She was taking xarelto for 6 months but has been off of it for a "long time", unsure of the exact amount of time. She reports being very anxious and having a lot of losses in the past year. She reports loosing her job, home, car, and 4 really close people to her. Patient is tachycardic and visibly shaking.  Denies Si/HI, hallucinations, denies ETOH or illicit drug use.  Past Medical History  Diagnosis Date  . Cellulitis 11/29/2011  . History of breast cancer   . Allergy   . Cancer 12/23/2008    Breast Cancer R; s/p lumpectomy with radiation, chemottherapy  . Hypertension   . Pulmonary embolus   . DVT (deep venous thrombosis)   . GERD (gastroesophageal reflux disease)   . Hypertension    Past Surgical History  Procedure Laterality Date  . Breast lumpectomy  11/2009    right; s/p radiation, chemotherapy   Family History  Problem Relation Age of Onset  . Leukemia Father   . Arthritis    . Heart disease    . Hypertension    . Kidney disease    . Diabetes    . Diabetes Other    History  Substance Use Topics  . Smoking status: Never Smoker   . Smokeless tobacco: Never Used  . Alcohol Use: Yes     Comment: socially   OB History   Grav Para Term Preterm Abortions TAB SAB Ect Mult Living                 Review of Systems   Review of Systems  Gen: no weight loss, fevers, chills, night sweats  Eyes: no discharge or drainage, no occular pain or visual changes    Nose: no epistaxis or rhinorrhea  Mouth: no dental pain, no sore throat  Neck: no neck pain  Lungs:No wheezing, coughing or hemoptysis CV: no chest pain, palpitations, dependent edema or orthopnea  Abd: no abdominal pain, nausea, vomiting, diarrhea GU: no dysuria or gross hematuria  MSK:  No muscle weakness or pain Neuro: no headache, no focal neurologic deficits  Skin: no rash, + bruising to leg Psyche: + anxiety   Allergies  Tamoxifen  Home Medications   Prior to Admission medications   Medication Sig Start Date End Date Taking? Authorizing Provider  anastrozole (ARIMIDEX) 1 MG tablet Take 1 mg by mouth daily.   Yes Historical Provider, MD  cetirizine (ZYRTEC) 10 MG tablet Take 10 mg by mouth daily as needed for allergies.   Yes Historical Provider, MD   BP 141/86  Pulse 117  Temp(Src) 98.6 F (37 C) (Oral)  Resp 18  SpO2 99% Physical Exam  Nursing note and vitals reviewed. Constitutional: She appears well-developed and well-nourished. No distress.  HENT:  Head: Normocephalic and atraumatic.  Eyes: Pupils are equal, round, and reactive to light.  Neck: Normal range of motion. Neck supple.  Cardiovascular: Regular rhythm.  Tachycardia present.   Pulmonary/Chest: Effort normal.  Abdominal: Soft.  Neurological:  She is alert.  Skin: Skin is warm and dry.     Psychiatric: Her speech is normal and behavior is normal. Her mood appears anxious. She does not exhibit a depressed mood. She expresses no homicidal and no suicidal ideation.    ED Course  Procedures (including critical care time) Labs Review Labs Reviewed  CBC WITH DIFFERENTIAL - Abnormal; Notable for the following:    Hemoglobin 15.4 (*)    Neutrophils Relative % 82 (*)    All other components within normal limits  BASIC METABOLIC PANEL - Abnormal; Notable for the following:    Glucose, Bld 113 (*)    Calcium 11.0 (*)    GFR calc non Af Amer 82 (*)    All other components within normal limits   PROTIME-INR    Imaging Review No results found.   EKG Interpretation None      MDM   Final diagnoses:  Bruising    Author: Doyne Keel Simonetti Service: Vascular Lab Author Type: Cardiovascular Sonographer   Filed: 06/09/2014 1:05 PM Note Time: 06/09/2014 1:05 PM Status: Signed   Editor: Doyne Keel Simonetti (Cardiovascular Sonographer)      *Preliminary Results*  Right lower extremity venous duplex completed.  Right lower extremity is negative for deep vein thrombosis. There is no evidence of right Baker's cyst.  06/09/2014 1:05 PM  Maudry Mayhew, RVT, RDCS, RDMS     Labs are reassuring and patient has no DVT in leg. Unsure of where bruising is coming from. I have concerns about patients anxiety. She was given 1mg  PO Ativan in the ED which helped significantly. I feel that she needs to discuss this with her PCP, will write a short course script for Ativan while she waits for her PCP appointment. She can follow-up with her oncologist regarding the bruising.  43 y.o.Delorice S Brissett's evaluation in the Emergency Department is complete. It has been determined that no acute conditions requiring further emergency intervention are present at this time. The patient/guardian have been advised of the diagnosis and plan. We have discussed signs and symptoms that warrant return to the ED, such as changes or worsening in symptoms.  Vital signs are stable at discharge. Filed Vitals:   06/09/14 1200  BP: 141/86  Pulse: 117  Temp:   Resp: 18    Patient/guardian has voiced understanding and agreed to follow-up with the PCP or specialist.    Linus Mako, PA-C 06/09/14 1340

## 2014-06-09 NOTE — Progress Notes (Signed)
*  Preliminary Results* Right lower extremity venous duplex completed. Right lower extremity is negative for deep vein thrombosis. There is no evidence of right Baker's cyst.  06/09/2014 1:05 PM  Maudry Mayhew, RVT, RDCS, RDMS

## 2014-06-15 ENCOUNTER — Ambulatory Visit (INDEPENDENT_AMBULATORY_CARE_PROVIDER_SITE_OTHER): Payer: No Typology Code available for payment source | Admitting: Family Medicine

## 2014-06-15 ENCOUNTER — Encounter: Payer: Self-pay | Admitting: Family Medicine

## 2014-06-15 VITALS — BP 124/80 | HR 95 | Temp 98.1°F | Ht 68.5 in | Wt 174.0 lb

## 2014-06-15 DIAGNOSIS — F419 Anxiety disorder, unspecified: Secondary | ICD-10-CM

## 2014-06-15 DIAGNOSIS — F341 Dysthymic disorder: Secondary | ICD-10-CM

## 2014-06-15 DIAGNOSIS — I10 Essential (primary) hypertension: Secondary | ICD-10-CM

## 2014-06-15 DIAGNOSIS — F329 Major depressive disorder, single episode, unspecified: Secondary | ICD-10-CM

## 2014-06-15 MED ORDER — PAROXETINE HCL 10 MG PO TABS: 10.0000 mg | ORAL_TABLET | Freq: Every day | ORAL | Status: DC

## 2014-06-15 NOTE — Progress Notes (Signed)
No chief complaint on file.   HPI:  Follow up:  HTN:  -was on BB at first visit with me in Jan and she wanted to stop the medication   -home log and repeat after sitting great last visit -reports: home BP doing good -denies: CP, SOB, palpitations   Depression:  -friend died of breast cancer at age 45 early 70 -reports 4 acquaintances passed early 2015 -mild stress and anxiety about this - did not wish to do medication and advised counseling, bereavement support -reports:has talked to Financial planner at hospice and is stressed about work and health, has panic attack occ related to worry about health and work - given ativan in ED but doesn't want to use - helps a little -denies: SI, manic syptoms  Chronic Rhinosinusitis: -referred to ENT last visit -reports: doing better, did not see ENT yet  Breast Cancer: -followed by  onc -s/p lumpectomy and ax lymph node disection 2010, T2 N1, stage IIB invasice ductal carcinoma, grade 1 S/p chemo and radiation -tamoxifen dc'd 2014 -02/2014 regimen: goserelin q 3 mo, anastrozole daily  Hx DVT and PE 2014: -treted with xarelto starting 09/2013  ROS: See pertinent positives and negatives per HPI.  Past Medical History  Diagnosis Date  . Cellulitis 11/29/2011  . History of breast cancer   . Allergy   . Cancer 12/23/2008    Breast Cancer R; s/p lumpectomy with radiation, chemottherapy  . Hypertension   . Pulmonary embolus   . DVT (deep venous thrombosis)   . GERD (gastroesophageal reflux disease)   . Hypertension     Past Surgical History  Procedure Laterality Date  . Breast lumpectomy  11/2009    right; s/p radiation, chemotherapy    Family History  Problem Relation Age of Onset  . Leukemia Father   . Arthritis    . Heart disease    . Hypertension    . Kidney disease    . Diabetes    . Diabetes Other     History   Social History  . Marital Status: Single    Spouse Name: N/A    Number of Children: N/A   . Years of Education: N/A   Social History Main Topics  . Smoking status: Never Smoker   . Smokeless tobacco: Never Used  . Alcohol Use: Yes     Comment: socially  . Drug Use: No  . Sexual Activity: Yes    Birth Control/ Protection: Injection   Other Topics Concern  . 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      Home Situation: lives alone      Spiritual Beliefs: none      Lifestyle: going to start exercise - diet is ok             Current outpatient prescriptions:anastrozole (ARIMIDEX) 1 MG tablet, Take 1 mg by mouth daily., Disp: , Rfl: ;  cetirizine (ZYRTEC) 10 MG tablet, Take 10 mg by mouth daily as needed for allergies., Disp: , Rfl: ;  fexofenadine (ALLEGRA) 180 MG tablet, Take 180 mg by mouth daily., Disp: , Rfl: ;  LORazepam (ATIVAN) 1 MG tablet, Take 1 tablet (1 mg total) by mouth 3 (three) times daily as needed for anxiety., Disp: 15 tablet, Rfl: 0 PARoxetine (PAXIL) 10 MG tablet, Take 1 tablet (10 mg total) by mouth daily., Disp: 30 tablet, Rfl: 3  EXAM:  Filed Vitals:  06/15/14 0852  BP: 124/80  Pulse: 95  Temp: 98.1 F (36.7 C)    Body mass index is 26.07 kg/(m^2).  GENERAL: vitals reviewed and listed above, alert, oriented, appears well hydrated and in no acute distress  HEENT: atraumatic, conjunttiva clear, no obvious abnormalities on inspection of external nose and ears  NECK: no obvious masses on inspection  LUNGS: clear to auscultation bilaterally, no wheezes, rales or rhonchi, good air movement  CV: HRRR, no peripheral edema  MS: moves all extremities without noticeable abnormality  PSYCH: pleasant and cooperative, no obvious depression or anxiety  ASSESSMENT AND PLAN:  Discussed the following assessment and plan:  Anxiety and depression - Plan: PARoxetine (PAXIL) 10 MG tablet -worsening, discussed options and opted to start paxil after discussion of risks and benefit, ativan  sparingly prn for panic attacks - discussed signifficant risks, advised counseling and exercise and close follow up  Essential hypertension  -Patient advised to return or notify a doctor immediately if symptoms worsen or persist or new concerns arise.  Patient Instructions  FOR YOUR ANXIETY, STRESS or DEPRESSION:  []  Seek counseling - this is as effective as medications and will help to get at the root of the imbalance. Please use the number provided to set up an appointment.  []  Ensure AT LEAST 150 minutes of cardiovascular exercise per week - 30 minutes of sweaty exercise daily is best.  []  Set a schedule that includes adequate time for sleep, fun activities and exercise.  []  Medications are best used short term while finding a healthier more balanced life that promotes good emotional and mental health. I do not prescribe sleep medications such as Ambien, etc. or controlled anxiety medications such as Xanax, Klonopin, etc. long term in adult patients and will have you see a psychiatrist if these types of medications are required on more then a temporary basis.  [] start the paxil, take this medication once every day and do not stop suddenly  Follow up in one month        KIM, HANNAH R.

## 2014-06-15 NOTE — Progress Notes (Signed)
Pre visit review using our clinic review tool, if applicable. No additional management support is needed unless otherwise documented below in the visit note. 

## 2014-06-15 NOTE — Patient Instructions (Signed)
FOR YOUR ANXIETY, STRESS or DEPRESSION:  []  Seek counseling - this is as effective as medications and will help to get at the root of the imbalance. Please use the number provided to set up an appointment.  []  Ensure AT LEAST 150 minutes of cardiovascular exercise per week - 30 minutes of sweaty exercise daily is best.  []  Set a schedule that includes adequate time for sleep, fun activities and exercise.  []  Medications are best used short term while finding a healthier more balanced life that promotes good emotional and mental health. I do not prescribe sleep medications such as Ambien, etc. or controlled anxiety medications such as Xanax, Klonopin, etc. long term in adult patients and will have you see a psychiatrist if these types of medications are required on more then a temporary basis.  [] start the paxil, take this medication once every day and do not stop suddenly  Follow up in one month

## 2014-07-15 ENCOUNTER — Ambulatory Visit: Payer: No Typology Code available for payment source | Admitting: Family Medicine

## 2014-07-18 ENCOUNTER — Other Ambulatory Visit: Payer: Self-pay

## 2014-07-25 ENCOUNTER — Ambulatory Visit: Payer: Self-pay | Admitting: Oncology

## 2014-08-04 ENCOUNTER — Other Ambulatory Visit (HOSPITAL_BASED_OUTPATIENT_CLINIC_OR_DEPARTMENT_OTHER): Payer: No Typology Code available for payment source

## 2014-08-04 DIAGNOSIS — C50919 Malignant neoplasm of unspecified site of unspecified female breast: Secondary | ICD-10-CM

## 2014-08-04 DIAGNOSIS — C50419 Malignant neoplasm of upper-outer quadrant of unspecified female breast: Secondary | ICD-10-CM

## 2014-08-04 LAB — COMPREHENSIVE METABOLIC PANEL (CC13)
ALK PHOS: 119 U/L (ref 40–150)
ALT: 14 U/L (ref 0–55)
AST: 15 U/L (ref 5–34)
Albumin: 3.7 g/dL (ref 3.5–5.0)
Anion Gap: 11 mEq/L (ref 3–11)
BUN: 12.3 mg/dL (ref 7.0–26.0)
CO2: 28 meq/L (ref 22–29)
CREATININE: 0.9 mg/dL (ref 0.6–1.1)
Calcium: 10 mg/dL (ref 8.4–10.4)
Chloride: 106 mEq/L (ref 98–109)
Glucose: 98 mg/dl (ref 70–140)
Potassium: 4.1 mEq/L (ref 3.5–5.1)
SODIUM: 144 meq/L (ref 136–145)
TOTAL PROTEIN: 7.5 g/dL (ref 6.4–8.3)
Total Bilirubin: 0.34 mg/dL (ref 0.20–1.20)

## 2014-08-04 LAB — CBC WITH DIFFERENTIAL/PLATELET
BASO%: 0.8 % (ref 0.0–2.0)
Basophils Absolute: 0.1 10*3/uL (ref 0.0–0.1)
EOS%: 0.3 % (ref 0.0–7.0)
Eosinophils Absolute: 0 10*3/uL (ref 0.0–0.5)
HCT: 43.8 % (ref 34.8–46.6)
HGB: 14.5 g/dL (ref 11.6–15.9)
LYMPH%: 21.3 % (ref 14.0–49.7)
MCH: 33.3 pg (ref 25.1–34.0)
MCHC: 33.1 g/dL (ref 31.5–36.0)
MCV: 100.5 fL (ref 79.5–101.0)
MONO#: 0.6 10*3/uL (ref 0.1–0.9)
MONO%: 7.9 % (ref 0.0–14.0)
NEUT#: 4.9 10*3/uL (ref 1.5–6.5)
NEUT%: 69.7 % (ref 38.4–76.8)
PLATELETS: 203 10*3/uL (ref 145–400)
RBC: 4.36 10*6/uL (ref 3.70–5.45)
RDW: 12.1 % (ref 11.2–14.5)
WBC: 7 10*3/uL (ref 3.9–10.3)
lymph#: 1.5 10*3/uL (ref 0.9–3.3)

## 2014-08-10 ENCOUNTER — Telehealth: Payer: Self-pay | Admitting: *Deleted

## 2014-08-10 NOTE — Telephone Encounter (Signed)
Called pt and explained scheduling change and she is fine with seeing Heather.  Changed appt.

## 2014-08-11 ENCOUNTER — Ambulatory Visit (HOSPITAL_BASED_OUTPATIENT_CLINIC_OR_DEPARTMENT_OTHER): Payer: No Typology Code available for payment source | Admitting: Nurse Practitioner

## 2014-08-11 ENCOUNTER — Telehealth: Payer: Self-pay | Admitting: Nurse Practitioner

## 2014-08-11 ENCOUNTER — Encounter: Payer: Self-pay | Admitting: Nurse Practitioner

## 2014-08-11 ENCOUNTER — Ambulatory Visit (HOSPITAL_BASED_OUTPATIENT_CLINIC_OR_DEPARTMENT_OTHER): Payer: No Typology Code available for payment source

## 2014-08-11 VITALS — BP 148/97 | HR 92 | Temp 98.1°F | Resp 20 | Ht 68.5 in | Wt 172.0 lb

## 2014-08-11 DIAGNOSIS — C50419 Malignant neoplasm of upper-outer quadrant of unspecified female breast: Secondary | ICD-10-CM

## 2014-08-11 DIAGNOSIS — M8580 Other specified disorders of bone density and structure, unspecified site: Secondary | ICD-10-CM

## 2014-08-11 DIAGNOSIS — I2699 Other pulmonary embolism without acute cor pulmonale: Secondary | ICD-10-CM

## 2014-08-11 DIAGNOSIS — C50911 Malignant neoplasm of unspecified site of right female breast: Secondary | ICD-10-CM

## 2014-08-11 DIAGNOSIS — Z5111 Encounter for antineoplastic chemotherapy: Secondary | ICD-10-CM

## 2014-08-11 DIAGNOSIS — I82409 Acute embolism and thrombosis of unspecified deep veins of unspecified lower extremity: Secondary | ICD-10-CM

## 2014-08-11 DIAGNOSIS — Z170 Estrogen receptor positive status [ER+]: Secondary | ICD-10-CM

## 2014-08-11 MED ORDER — GOSERELIN ACETATE 10.8 MG ~~LOC~~ IMPL
10.8000 mg | DRUG_IMPLANT | SUBCUTANEOUS | Status: DC
  Administered 2014-08-11: 10.8 mg via SUBCUTANEOUS
  Filled 2014-08-11: qty 10.8

## 2014-08-11 NOTE — Telephone Encounter (Signed)
, °

## 2014-08-11 NOTE — Progress Notes (Signed)
ID: Jenna Gonzales   DOB: [birth year]  MR#: [id number]  VOH#:[id number]  PCP: Lucretia Kern., DO GYN: [name], MD SU: Excell Seltzer, MD OTHER MD:  CHIEF COMPLAINTS:  1) Hx of Right Breast Cancer      2) DVT/PE   CURRENT THERAPY: anastrozole, goserelin injections  BREAST CANCER HISTORY: The patient saw Dr. Garwin Brothers for a routine gynecologic evaluation, and Dr. Garwin Brothers did a breast exam which showed a palpable mass in the right breast.  She was referred for a diagnostic mammography 11/03/2009, and Dr. Claudie Revering was able to confirm a palpable mass in the 10 o'clock position of the right breast 7 cm from the nipple.  There was no palpable adenopathy.  By mammography the breasts were dense, but there was a spiculated mass in the upper outer right breast corresponding to the palpable mass by ultrasound as measured 2.1 cm.  It was hypoechoic, irregular, and poorly defined.  Ultrasound of the right axilla demonstrated several normal sized lymph nodes, several of them showing cortical thickening.    With this information, the patient was brought back on 11/19 for biopsy.  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  The lymph node biopsy obtained at the same time was negative.  The tumor was ER positive at 99%, PR positive at 20% with a low proliferation marker at 12%, and HER-2/neu was not amplified with a ratio of 1.45.  Bilateral breast MRIs were obtained 11/22.  This confirmed a 2.2 cm irregularly marginated enhancing mass in the upper outer right breast with a clip in the mass.  There were no other masses, and aside from post biopsy changes in the right axilla, there were no abnormal appearing lymph nodes.    The patient proceeded to right lumpectomy and sentinel lymph node sampling 12/09 under Dr. Excell Seltzer because the two sentinel lymph nodes provided positive for cancer.  Dr. Excell Seltzer proceeded to a right axillary lymph  node dissection in the same procedure.  The final pathology [id number]) showed an invasive ductal carcinoma, grade 1, with negative although close margins (the closest was 1 mm posteriorly), with evidence of lymphovascular invasion and involving 2 out of 17 lymph nodes sampled (the 2 sentinel lymph nodes). Her subsequent history is as detailed below.   INTERVAL HISTORY: Jenna Gonzales returns alone today for follow up of her right breast cancer. She has been on anastrozole since December of last year, with 3 years of tamoxifen use prior to that. She was premenopausal at the start of the anastrozole so she is still taking goserelin injections every 3 months. The interval history is insignificant. Kin has no complains with her current treatment. She denies joint pain or vaginal changes. She has hot flashes occasionally, but nothing she feels she cant handle. She is not currently exercising, but plans to being walking daily.   REVIEW OF SYSTEMS: Jenna Gonzales denies fevers, chills, nausea, vomiting, and bowel or bladder changes. A detailed review of systems was otherwise negative.    PAST MEDICAL HISTORY: Past Medical History  Diagnosis Date  . Cellulitis 11/29/2011  . History of breast cancer   . Allergy   . Cancer 12/23/2008    Breast Cancer R; s/p lumpectomy with radiation, chemottherapy  . Hypertension   . Pulmonary embolus   . DVT (deep venous thrombosis)   . GERD (gastroesophageal reflux disease)   . Hypertension   Mild GERD, history of a heart murmur, which was not appreciated today, history of  borderline hypertension not under treatment, history of irritable bowel disease, and history of minimal arthritic changes involving particularly the index finger of the right hand.  PAST SURGICAL HISTORY: Past Surgical History  Procedure Laterality Date  . Breast lumpectomy  11/2009    right; s/p radiation, chemotherapy    FAMILY HISTORY Family History  Problem Relation Age of Onset  . Leukemia Father   .  Arthritis    . Heart disease    . Hypertension    . Kidney disease    . Diabetes    . Diabetes Other   The patient's father died from leukemia at the age of 41 (I cannot tell from the history if this was chronic or acute).  The patient's mother is alive at age 45.  The patient has 2 sisters and 1 brother, all here in [place].  She also has 2 half brothers living in [place].  There is no history of breast or ovarian cancer in the family.  GYNECOLOGIC HISTORY: (Updated 02/24/2014) She is GX, P1.  First pregnancy to term age 24.  Last menstrual period was in March 2011 with the initiation of chemotherapy. Hormone levels drawn in October 2014 showed patient to  be premenopausal.   SOCIAL HISTORY:    (Updated 02/24/2014) The patient previously worked at [employer] in [place].  She worked for the [employer] for 8 years.  She is single. Her son Dahrahn lives with her.  He "could never finish school," and does not have a job at present.  He does have 2 daughters who live with their mother here in [place].  The patient attends [employer].    ADVANCED DIRECTIVES:  HEALTH MAINTENANCE: (Updated 02/24/2014) History  Substance Use Topics  . Smoking status: Never Smoker   . Smokeless tobacco: Never Used  . Alcohol Use: Yes     Comment: socially     Colonoscopy: Never  PAP: UTD/Dr. Garwin Brothers  Bone density: Never/Scheduled for 03/11/2014  Lipid panel: Not on file   Allergies  Allergen Reactions  . Tamoxifen     Blood clots    Current Outpatient Prescriptions  Medication Sig Dispense Refill  . anastrozole (ARIMIDEX) 1 MG tablet Take 1 mg by mouth daily.      . cetirizine (ZYRTEC) 10 MG tablet Take 10 mg by mouth daily as needed for allergies.      . fexofenadine (ALLEGRA) 180 MG tablet Take 180 mg by mouth daily.      Marland Kitchen LORazepam (ATIVAN) 1 MG tablet Take 1 tablet (1 mg total) by mouth 3 (three) times daily as needed for anxiety.  15 tablet  0   . PARoxetine (PAXIL) 10 MG tablet Take 1 tablet (10 mg total) by mouth daily.  30 tablet  3   No current facility-administered medications for this visit.    OBJECTIVE: Young [place] American woman who appears well and is in no acute distress. Filed Vitals:   08/11/14 0932  BP: 148/97  Pulse: 92  Temp: 98.1 F (36.7 C)  Resp: 20     Body mass index is 25.77 kg/(m^2).    ECOG FS: 0 Filed Weights   08/11/14 0932  Weight: 172 lb (78.019 kg)   Physical Exam: Skin: warm, dry  HEENT: sclerae anicteric, conjunctivae pink, oropharynx clear. No thrush or mucositis.  Lymph Nodes: No cervical or supraclavicular lymphadenopathy  Lungs: clear to auscultation bilaterally, no rales, wheezes, or rhonci  Heart: regular rate and rhythm  Abdomen:  round, soft, non tender, positive bowel sounds  Musculoskeletal: No focal spinal tenderness, no peripheral edema  Neuro: non focal, well oriented, positive affect  Breast: right breast status post lumpectomy and radiation, no evidence of disease recurrence. Right axilla benign. Left breast unremarkable.    LAB RESULTS: Lab Results  Component Value Date   WBC 7.0 08/04/2014   NEUTROABS 4.9 08/04/2014   HGB 14.5 08/04/2014   HCT 43.8 08/04/2014   MCV 100.5 08/04/2014   PLT 203 08/04/2014      Chemistry      Component Value Date/Time   NA 144 08/04/2014 0839   NA 144 06/09/2014 1246   K 4.1 08/04/2014 0839   K 4.2 06/09/2014 1246   CL 102 06/09/2014 1246   CL 106 01/19/2013 1507   CO2 28 08/04/2014 0839   CO2 26 06/09/2014 1246   BUN 12.3 08/04/2014 0839   BUN 12 06/09/2014 1246   CREATININE 0.9 08/04/2014 0839   CREATININE 0.85 06/09/2014 1246      Component Value Date/Time   CALCIUM 10.0 08/04/2014 0839   CALCIUM 11.0* 06/09/2014 1246   ALKPHOS 119 08/04/2014 0839   ALKPHOS 80 07/16/2012 1406   AST 15 08/04/2014 0839   AST 18 07/16/2012 1406   ALT 14 08/04/2014 0839   ALT 19 07/16/2012 1406   BILITOT 0.34 08/04/2014 0839   BILITOT 0.4 07/16/2012 1406        STUDIES:  Most recent bilateral mammogram on 05/17/14 was unremarkable.   No baseline bone density scan at this time  ASSESSMENT: 43 y.o.  [place] woman   (1)  status post right lumpectomy and axillary lymph node dissection December of 2010 for a T2 N1, stage IIB invasive ductal carcinoma, grade 1, strongly estrogen receptor positive, moderately progesterone receptor positive, HER-2 negative, with a low proliferation fraction;   (2)  treated adjuvantly with 6 cycles of docetaxel/doxorubicin/cyclophosphamide, completed in May of 2011,   (3)   followed by radiation completed July of 2011 at which time she started tamoxifen.  (4)  tamoxifen was discontinued in October 2014 at which time patient was diagnosed with a right lower extremity DVT and pulmonary embolism.  (5)  anti-coagulation therapy with Xarelto, beginning 09/23/2013  (6)  labs drawn in October 2014 confirmed a premenopausal status. Patient received her first Q3 month injection of goserelin on 12/02/2013, and then began on anastrozole at 1 mg daily.    PLAN: Jenna Gonzales is doing very well as far as her breast cancer is concerned. There is no evidence of disease recurrence. The labs were reviewed in detail with the patient and were stable. I encouraged Kim in the pursuit of beginning and exercise goal and plan. She is still due for a baseline bone density scan and this has been ordered today.  The plan is to continue the anastrozole for at least one more year to make a total of 5 years on anti-estrogen therapy. She will continue with goserelin injections every 3 months and has an appointment for one immediately following this office visit. Kim understands and agrees with the plan. She has been encouraged to call with any issues that might arise before her next visit here.   Marcelino Duster, NP   08/11/2014

## 2014-09-26 ENCOUNTER — Other Ambulatory Visit: Payer: Self-pay | Admitting: Oncology

## 2014-09-26 DIAGNOSIS — C50919 Malignant neoplasm of unspecified site of unspecified female breast: Secondary | ICD-10-CM

## 2014-11-11 ENCOUNTER — Ambulatory Visit (HOSPITAL_BASED_OUTPATIENT_CLINIC_OR_DEPARTMENT_OTHER): Payer: No Typology Code available for payment source

## 2014-11-11 ENCOUNTER — Other Ambulatory Visit: Payer: Self-pay | Admitting: *Deleted

## 2014-11-11 DIAGNOSIS — C50411 Malignant neoplasm of upper-outer quadrant of right female breast: Secondary | ICD-10-CM

## 2014-11-11 DIAGNOSIS — C50919 Malignant neoplasm of unspecified site of unspecified female breast: Secondary | ICD-10-CM

## 2014-11-11 DIAGNOSIS — Z5111 Encounter for antineoplastic chemotherapy: Secondary | ICD-10-CM

## 2014-11-11 MED ORDER — GOSERELIN ACETATE 10.8 MG ~~LOC~~ IMPL
10.8000 mg | DRUG_IMPLANT | Freq: Once | SUBCUTANEOUS | Status: DC
  Administered 2014-11-11: 10.8 mg via SUBCUTANEOUS
  Filled 2014-11-11: qty 10.8

## 2014-11-11 NOTE — Patient Instructions (Signed)
Goserelin injection What is this medicine? GOSERELIN (GOE se rel in) is similar to a hormone found in the body. It lowers the amount of sex hormones that the body makes. Men will have lower testosterone levels and women will have lower estrogen levels while taking this medicine. In men, this medicine is used to treat prostate cancer; the injection is either given once per month or once every 12 weeks. A once per month injection (only) is used to treat women with endometriosis, dysfunctional uterine bleeding, or advanced breast cancer. This medicine may be used for other purposes; ask your health care provider or pharmacist if you have questions. COMMON BRAND NAME(S): Zoladex What should I tell my health care provider before I take this medicine? They need to know if you have any of these conditions (some only apply to women): -diabetes -heart disease or previous heart attack -high blood pressure -high cholesterol -kidney disease -osteoporosis or low bone density -problems passing urine -spinal cord injury -stroke -tobacco smoker -an unusual or allergic reaction to goserelin, hormone therapy, other medicines, foods, dyes, or preservatives -pregnant or trying to get pregnant -breast-feeding How should I use this medicine? This medicine is for injection under the skin. It is given by a health care professional in a hospital or clinic setting. Men receive this injection once every 4 weeks or once every 12 weeks. Women will only receive the once every 4 weeks injection. Talk to your pediatrician regarding the use of this medicine in children. Special care may be needed. Overdosage: If you think you have taken too much of this medicine contact a poison control center or emergency room at once. NOTE: This medicine is only for you. Do not share this medicine with others. What if I miss a dose? It is important not to miss your dose. Call your doctor or health care professional if you are unable to  keep an appointment. What may interact with this medicine? -female hormones like estrogen -herbal or dietary supplements like black cohosh, chasteberry, or DHEA -female hormones like testosterone -prasterone This list may not describe all possible interactions. Give your health care provider a list of all the medicines, herbs, non-prescription drugs, or dietary supplements you use. Also tell them if you smoke, drink alcohol, or use illegal drugs. Some items may interact with your medicine. What should I watch for while using this medicine? Visit your doctor or health care professional for regular checks on your progress. Your symptoms may appear to get worse during the first weeks of this therapy. Tell your doctor or healthcare professional if your symptoms do not start to get better or if they get worse after this time. Your bones may get weaker if you take this medicine for a long time. If you smoke or frequently drink alcohol you may increase your risk of bone loss. A family history of osteoporosis, chronic use of drugs for seizures (convulsions), or corticosteroids can also increase your risk of bone loss. Talk to your doctor about how to keep your bones strong. This medicine should stop regular monthly menstration in women. Tell your doctor if you continue to menstrate. Women should not become pregnant while taking this medicine or for 12 weeks after stopping this medicine. Women should inform their doctor if they wish to become pregnant or think they might be pregnant. There is a potential for serious side effects to an unborn child. Talk to your health care professional or pharmacist for more information. Do not breast-feed an infant while taking   this medicine. Men should inform their doctors if they wish to father a child. This medicine may lower sperm counts. Talk to your health care professional or pharmacist for more information. What side effects may I notice from receiving this  medicine? Side effects that you should report to your doctor or health care professional as soon as possible: -allergic reactions like skin rash, itching or hives, swelling of the face, lips, or tongue -bone pain -breathing problems -changes in vision -chest pain -feeling faint or lightheaded, falls -fever, chills -pain, swelling, warmth in the leg -pain, tingling, numbness in the hands or feet -signs and symptoms of low blood pressure like dizziness; feeling faint or lightheaded, falls; unusually weak or tired -stomach pain -swelling of the ankles, feet, hands -trouble passing urine or change in the amount of urine -unusually high or low blood pressure -unusually weak or tired Side effects that usually do not require medical attention (report to your doctor or health care professional if they continue or are bothersome): -change in sex drive or performance -changes in breast size in both males and females -changes in emotions or moods -headache -hot flashes -irritation at site where injected -loss of appetite -skin problems like acne, dry skin -vaginal dryness This list may not describe all possible side effects. Call your doctor for medical advice about side effects. You may report side effects to FDA at 1-800-FDA-1088. Where should I keep my medicine? This drug is given in a hospital or clinic and will not be stored at home. NOTE: This sheet is a summary. It may not cover all possible information. If you have questions about this medicine, talk to your doctor, pharmacist, or health care provider.  2015, Elsevier/[employer] Standard. (2014-02-15 11:10:35)  

## 2014-12-06 ENCOUNTER — Other Ambulatory Visit: Payer: Self-pay | Admitting: Nurse Practitioner

## 2014-12-06 ENCOUNTER — Telehealth: Payer: Self-pay | Admitting: Nurse Practitioner

## 2014-12-06 NOTE — Telephone Encounter (Signed)
cld &^ spoke to pt in re to r/s appt due to HF on PAL-moved pt appt to 02/08/15-pt understood

## 2015-01-27 ENCOUNTER — Other Ambulatory Visit: Payer: Self-pay | Admitting: Oncology

## 2015-02-07 ENCOUNTER — Other Ambulatory Visit: Payer: Self-pay | Admitting: *Deleted

## 2015-02-07 DIAGNOSIS — C50919 Malignant neoplasm of unspecified site of unspecified female breast: Secondary | ICD-10-CM

## 2015-02-08 ENCOUNTER — Ambulatory Visit (HOSPITAL_BASED_OUTPATIENT_CLINIC_OR_DEPARTMENT_OTHER): Payer: BLUE CROSS/BLUE SHIELD

## 2015-02-08 ENCOUNTER — Other Ambulatory Visit (HOSPITAL_BASED_OUTPATIENT_CLINIC_OR_DEPARTMENT_OTHER): Payer: BLUE CROSS/BLUE SHIELD

## 2015-02-08 ENCOUNTER — Telehealth: Payer: Self-pay | Admitting: Oncology

## 2015-02-08 ENCOUNTER — Ambulatory Visit (HOSPITAL_BASED_OUTPATIENT_CLINIC_OR_DEPARTMENT_OTHER): Payer: BLUE CROSS/BLUE SHIELD | Admitting: Nurse Practitioner

## 2015-02-08 ENCOUNTER — Encounter: Payer: Self-pay | Admitting: Nurse Practitioner

## 2015-02-08 VITALS — BP 140/97 | HR 80 | Temp 98.3°F | Resp 18 | Ht 68.0 in | Wt 159.6 lb

## 2015-02-08 DIAGNOSIS — C50911 Malignant neoplasm of unspecified site of right female breast: Secondary | ICD-10-CM

## 2015-02-08 DIAGNOSIS — Z5111 Encounter for antineoplastic chemotherapy: Secondary | ICD-10-CM

## 2015-02-08 DIAGNOSIS — C50411 Malignant neoplasm of upper-outer quadrant of right female breast: Secondary | ICD-10-CM

## 2015-02-08 DIAGNOSIS — Z170 Estrogen receptor positive status [ER+]: Secondary | ICD-10-CM

## 2015-02-08 DIAGNOSIS — Z86711 Personal history of pulmonary embolism: Secondary | ICD-10-CM

## 2015-02-08 DIAGNOSIS — M2550 Pain in unspecified joint: Secondary | ICD-10-CM | POA: Insufficient documentation

## 2015-02-08 DIAGNOSIS — C50919 Malignant neoplasm of unspecified site of unspecified female breast: Secondary | ICD-10-CM

## 2015-02-08 DIAGNOSIS — Z86718 Personal history of other venous thrombosis and embolism: Secondary | ICD-10-CM

## 2015-02-08 LAB — COMPREHENSIVE METABOLIC PANEL (CC13)
ALBUMIN: 3.7 g/dL (ref 3.5–5.0)
ALK PHOS: 119 U/L (ref 40–150)
ALT: 10 U/L (ref 0–55)
AST: 13 U/L (ref 5–34)
Anion Gap: 9 mEq/L (ref 3–11)
BUN: 12.8 mg/dL (ref 7.0–26.0)
CHLORIDE: 106 meq/L (ref 98–109)
CO2: 28 mEq/L (ref 22–29)
Calcium: 9.8 mg/dL (ref 8.4–10.4)
Creatinine: 0.9 mg/dL (ref 0.6–1.1)
GLUCOSE: 121 mg/dL (ref 70–140)
Potassium: 3.9 mEq/L (ref 3.5–5.1)
Sodium: 143 mEq/L (ref 136–145)
TOTAL PROTEIN: 6.9 g/dL (ref 6.4–8.3)
Total Bilirubin: 0.48 mg/dL (ref 0.20–1.20)

## 2015-02-08 LAB — CBC WITH DIFFERENTIAL/PLATELET
BASO%: 0.2 % (ref 0.0–2.0)
Basophils Absolute: 0 10*3/uL (ref 0.0–0.1)
EOS ABS: 0 10*3/uL (ref 0.0–0.5)
EOS%: 0.5 % (ref 0.0–7.0)
HEMATOCRIT: 43.4 % (ref 34.8–46.6)
HGB: 14.4 g/dL (ref 11.6–15.9)
LYMPH%: 30.9 % (ref 14.0–49.7)
MCH: 34.1 pg — ABNORMAL HIGH (ref 25.1–34.0)
MCHC: 33.2 g/dL (ref 31.5–36.0)
MCV: 102.8 fL — AB (ref 79.5–101.0)
MONO#: 0.4 10*3/uL (ref 0.1–0.9)
MONO%: 7.8 % (ref 0.0–14.0)
NEUT#: 3.3 10*3/uL (ref 1.5–6.5)
NEUT%: 60.6 % (ref 38.4–76.8)
PLATELETS: 191 10*3/uL (ref 145–400)
RBC: 4.22 10*6/uL (ref 3.70–5.45)
RDW: 11.8 % (ref 11.2–14.5)
WBC: 5.5 10*3/uL (ref 3.9–10.3)
lymph#: 1.7 10*3/uL (ref 0.9–3.3)

## 2015-02-08 MED ORDER — GOSERELIN ACETATE 10.8 MG ~~LOC~~ IMPL
10.8000 mg | DRUG_IMPLANT | SUBCUTANEOUS | Status: DC
  Administered 2015-02-08: 10.8 mg via SUBCUTANEOUS
  Filled 2015-02-08: qty 10.8

## 2015-02-08 NOTE — Progress Notes (Signed)
ID: Jenna Gonzales   DOB: 03-08-1971  MR#: [id number]  YIF#:[id number]  PCP: Lucretia Kern., DO GYN: [name], MD SU: Excell Seltzer, MD OTHER MD:  CHIEF COMPLAINTS:  1) Hx of Right Breast Cancer      2) DVT/PE   CURRENT THERAPY: anastrozole, goserelin injections  BREAST CANCER HISTORY: The patient saw Dr. Garwin Brothers for a routine gynecologic evaluation, and Dr. Garwin Brothers did a breast exam which showed a palpable mass in the right breast.  She was referred for a diagnostic mammography 11/03/2009, and Dr. Claudie Revering was able to confirm a palpable mass in the 10 o'clock position of the right breast 7 cm from the nipple.  There was no palpable adenopathy.  By mammography the breasts were dense, but there was a spiculated mass in the upper outer right breast corresponding to the palpable mass by ultrasound as measured 2.1 cm.  It was hypoechoic, irregular, and poorly defined.  Ultrasound of the right axilla demonstrated several normal sized lymph nodes, several of them showing cortical thickening.    With this information, the patient was brought back on 11/19 for biopsy.  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  The lymph node biopsy obtained at the same time was negative.  The tumor was ER positive at 99%, PR positive at 20% with a low proliferation marker at 12%, and HER-2/neu was not amplified with a ratio of 1.45.  Bilateral breast MRIs were obtained 11/22.  This confirmed a 2.2 cm irregularly marginated enhancing mass in the upper outer right breast with a clip in the mass.  There were no other masses, and aside from post biopsy changes in the right axilla, there were no abnormal appearing lymph nodes.    The patient proceeded to right lumpectomy and sentinel lymph node sampling 12/09 under Dr. Excell Seltzer because the two sentinel lymph nodes provided positive for cancer.  Dr. Excell Seltzer proceeded to a right axillary lymph  node dissection in the same procedure.  The final pathology [id number]) showed an invasive ductal carcinoma, grade 1, with negative although close margins (the closest was 1 mm posteriorly), with evidence of lymphovascular invasion and involving 2 out of 17 lymph nodes sampled (the 2 sentinel lymph nodes). Her subsequent history is as detailed below.   INTERVAL HISTORY: Jenna Gonzales returns alone today for follow up of her right breast cancer. She continues on anastrozole and goeserelin injections every 3 months. Since her last visit her vaginal dryness and arthralgias have intensified. She feels "like an old woman" when she gets out of bed in the morning. Hot flashes are not an issue. She has been mindful of her diet and has lost 18lb since this time last year. She does not work out consistently but plans to when she can find more free time. She works and is in school full time currently.   REVIEW OF SYSTEMS: Jenna Gonzales denies fevers, chills, nausea, vomiting, and bowel or bladder changes. She has no shortness of breath, chest pain, cough, palpitations or fatigue. She endorses some anxiety, but stopped taking her paxil because she wants to handle it on her own. She is managing to "reel" herself back in when she become overwhelmed. She denies headaches, dizziness, or night sweats. A detailed review of systems is otherwise negative.   PAST MEDICAL HISTORY: Past Medical History  Diagnosis Date  . Cellulitis 11/29/2011  . History of breast cancer   . Allergy   . Cancer 12/23/2008  Breast Cancer R; s/p lumpectomy with radiation, chemottherapy  . Hypertension   . Pulmonary embolus   . DVT (deep venous thrombosis)   . GERD (gastroesophageal reflux disease)   . Hypertension   Mild GERD, history of a heart murmur, which was not appreciated today, history of borderline hypertension not under treatment, history of irritable bowel disease, and history of minimal arthritic changes involving particularly the index  finger of the right hand.  PAST SURGICAL HISTORY: Past Surgical History  Procedure Laterality Date  . Breast lumpectomy  11/2009    right; s/p radiation, chemotherapy    FAMILY HISTORY Family History  Problem Relation Age of Onset  . Leukemia Father   . Arthritis    . Heart disease    . Hypertension    . Kidney disease    . Diabetes    . Diabetes Other   The patient's father died from leukemia at the age of 69 (I cannot tell from the history if this was chronic or acute).  The patient's mother is alive at age 87.  The patient has 2 sisters and 1 brother, all here in [place].  She also has 2 half brothers living in [place].  There is no history of breast or ovarian cancer in the family.  GYNECOLOGIC HISTORY: (Updated 02/24/2014) She is GX, P1.  First pregnancy to term age 71.  Last menstrual period was in March 2011 with the initiation of chemotherapy. Hormone levels drawn in October 2014 showed patient to  be premenopausal.   SOCIAL HISTORY:    (Updated 02/24/2014) The patient previously worked at [employer] in [employer].  She worked for the [employer] for 8 years.  She is single. Her son Dahrahn lives with her.  He "could never finish school," and does not have a job at present.  He does have 2 daughters who live with their mother here in [place].  The patient attends [employer].    ADVANCED DIRECTIVES:  HEALTH MAINTENANCE: (Updated 02/24/2014) History  Substance Use Topics  . Smoking status: Never Smoker   . Smokeless tobacco: Never Used  . Alcohol Use: Yes     Comment: socially     Colonoscopy: Never  PAP: UTD/Dr. Garwin Brothers  Bone density: Never/Scheduled for 03/11/2014  Lipid panel: Not on file   Allergies  Allergen Reactions  . Tamoxifen     Blood clots    Current Outpatient Prescriptions  Medication Sig Dispense Refill  . anastrozole (ARIMIDEX) 1 MG tablet TAKE 1 TABLET BY MOUTH DAILY. BEGIN 1 DAY AFTER FIRST  ZOLADEX INJECTION. 30 tablet 4  . fexofenadine (ALLEGRA) 180 MG tablet Take 180 mg by mouth daily.    . cetirizine (ZYRTEC) 10 MG tablet Take 10 mg by mouth daily as needed for allergies.     No current facility-administered medications for this visit.    OBJECTIVE: Young [place] American woman who appears well and is in no acute distress. Filed Vitals:   02/08/15 0833  BP: 140/97  Pulse: 80  Temp: 98.3 F (36.8 C)  Resp: 18     Body mass index is 24.27 kg/(m^2).    ECOG FS: 0 Filed Weights   02/08/15 0833  Weight: 159 lb 9.6 oz (72.394 kg)   Physical Exam: Skin: warm, dry  HEENT: sclerae anicteric, conjunctivae pink, oropharynx clear. No thrush or mucositis.  Lymph Nodes: No cervical or supraclavicular lymphadenopathy  Lungs: clear to auscultation bilaterally, no rales, wheezes, or rhonci  Heart: regular  rate and rhythm  Abdomen: round, soft, non tender, positive bowel sounds  Musculoskeletal: No focal spinal tenderness, no peripheral edema  Neuro: non focal, well oriented, positive affect  Breast: right breast status post lumpectomy and radiation, no lumps or nodularity noted.  Right axilla benign. Left breast unremarkable.    LAB RESULTS: Lab Results  Component Value Date   WBC 5.5 02/08/2015   NEUTROABS 3.3 02/08/2015   HGB 14.4 02/08/2015   HCT 43.4 02/08/2015   MCV 102.8* 02/08/2015   PLT 191 02/08/2015      Chemistry      Component Value Date/Time   NA 143 02/08/2015 0806   NA 144 06/09/2014 1246   K 3.9 02/08/2015 0806   K 4.2 06/09/2014 1246   CL 102 06/09/2014 1246   CL 106 01/19/2013 1507   CO2 28 02/08/2015 0806   CO2 26 06/09/2014 1246   BUN 12.8 02/08/2015 0806   BUN 12 06/09/2014 1246   CREATININE 0.9 02/08/2015 0806   CREATININE 0.85 06/09/2014 1246      Component Value Date/Time   CALCIUM 9.8 02/08/2015 0806   CALCIUM 11.0* 06/09/2014 1246   ALKPHOS 119 02/08/2015 0806   ALKPHOS 80 07/16/2012 1406   AST 13 02/08/2015 0806   AST 18  07/16/2012 1406   ALT 10 02/08/2015 0806   ALT 19 07/16/2012 1406   BILITOT 0.48 02/08/2015 0806   BILITOT 0.4 07/16/2012 1406       STUDIES:  Most recent bilateral mammogram on 05/17/14 was unremarkable.   No baseline bone density scan at this time  ASSESSMENT: 44 y.o.  [place] woman   (1)  status post right lumpectomy and axillary lymph node dissection December of 2010 for a T2 N1, stage IIB invasive ductal carcinoma, grade 1, strongly estrogen receptor positive, moderately progesterone receptor positive, HER-2 negative, with a low proliferation fraction;   (2)  treated adjuvantly with 6 cycles of docetaxel/doxorubicin/cyclophosphamide, completed in May of 2011,   (3)   followed by radiation completed July of 2011 at which time she started tamoxifen.  (4)  tamoxifen was discontinued in October 2014 at which time patient was diagnosed with a right lower extremity DVT and pulmonary embolism.  (5)  anti-coagulation therapy with Xarelto, beginning 09/23/2013  (6)  labs drawn in October 2014 confirmed a premenopausal status. Patient received her first Q3 month injection of goserelin on 12/02/2013, and then began on anastrozole at 1 mg daily.    PLAN: The labs were reviewed in detail and were entirely stable. We discussed her complaints with anastrozole. I offered to write her a prescription for tramadol to take which might ease her joint pains but she declined. Instead, she will take a "drug holiday" for 4 weeks. When she restarts the should only have 4 months left on the anastrozole and she believes she can tough it out.   Francessca will continue with the goserelin injections every 3 months, and will receive her next one shortly after this visit. She will return for labs and a follow up visit with Dr. Jana Hakim. At this time she will be eligible to "graduate" from treatment and follow up visits. She understands and agrees with this plan. She knows the goal of treatment in her case is  cure. She has been encouraged to call with any issues that might arise before her next visit here.   Marcelino Duster, NP   02/08/2015

## 2015-02-08 NOTE — Telephone Encounter (Signed)
per pof to sch appt-gave pt copy of sch °

## 2015-02-10 ENCOUNTER — Ambulatory Visit: Payer: No Typology Code available for payment source

## 2015-02-10 ENCOUNTER — Ambulatory Visit: Payer: No Typology Code available for payment source | Admitting: Nurse Practitioner

## 2015-02-10 ENCOUNTER — Other Ambulatory Visit: Payer: No Typology Code available for payment source

## 2015-02-14 ENCOUNTER — Other Ambulatory Visit: Payer: Self-pay

## 2015-02-14 ENCOUNTER — Ambulatory Visit: Payer: Self-pay | Admitting: Nurse Practitioner

## 2015-02-14 ENCOUNTER — Ambulatory Visit: Payer: Self-pay

## 2015-02-16 ENCOUNTER — Telehealth: Payer: Self-pay | Admitting: *Deleted

## 2015-02-16 NOTE — Telephone Encounter (Signed)
Jenna Gonzales called for medication dosage of the three month injection patient receives at [facility].  Currently at the [facility].  Checking for possible interactions and contraindications.  Informed dose of Zoladex is 10.8 mg.

## 2015-05-03 ENCOUNTER — Ambulatory Visit (HOSPITAL_BASED_OUTPATIENT_CLINIC_OR_DEPARTMENT_OTHER): Payer: BLUE CROSS/BLUE SHIELD

## 2015-05-03 VITALS — BP 148/90 | HR 80 | Temp 98.0°F

## 2015-05-03 DIAGNOSIS — C50411 Malignant neoplasm of upper-outer quadrant of right female breast: Secondary | ICD-10-CM | POA: Diagnosis not present

## 2015-05-03 DIAGNOSIS — Z5111 Encounter for antineoplastic chemotherapy: Secondary | ICD-10-CM | POA: Diagnosis not present

## 2015-05-03 DIAGNOSIS — C50919 Malignant neoplasm of unspecified site of unspecified female breast: Secondary | ICD-10-CM

## 2015-05-03 MED ORDER — GOSERELIN ACETATE 10.8 MG ~~LOC~~ IMPL
10.8000 mg | DRUG_IMPLANT | SUBCUTANEOUS | Status: DC
  Administered 2015-05-03: 10.8 mg via SUBCUTANEOUS
  Filled 2015-05-03: qty 10.8

## 2015-06-06 ENCOUNTER — Other Ambulatory Visit: Payer: Self-pay | Admitting: Oncology

## 2015-06-06 DIAGNOSIS — Z9889 Other specified postprocedural states: Secondary | ICD-10-CM

## 2015-06-06 DIAGNOSIS — C50911 Malignant neoplasm of unspecified site of right female breast: Secondary | ICD-10-CM

## 2015-06-09 ENCOUNTER — Ambulatory Visit
Admission: RE | Admit: 2015-06-09 | Discharge: 2015-06-09 | Disposition: A | Discharge status: Home / Self Care | Payer: BLUE CROSS/BLUE SHIELD | Source: Ambulatory Visit | Attending: Oncology | Admitting: Oncology

## 2015-06-09 DIAGNOSIS — C50911 Malignant neoplasm of unspecified site of right female breast: Secondary | ICD-10-CM

## 2015-06-09 DIAGNOSIS — Z9889 Other specified postprocedural states: Secondary | ICD-10-CM

## 2015-07-12 ENCOUNTER — Telehealth: Payer: Self-pay | Admitting: Oncology

## 2015-07-12 ENCOUNTER — Other Ambulatory Visit (HOSPITAL_BASED_OUTPATIENT_CLINIC_OR_DEPARTMENT_OTHER): Payer: BLUE CROSS/BLUE SHIELD

## 2015-07-12 ENCOUNTER — Ambulatory Visit (HOSPITAL_BASED_OUTPATIENT_CLINIC_OR_DEPARTMENT_OTHER): Payer: BLUE CROSS/BLUE SHIELD | Admitting: Oncology

## 2015-07-12 VITALS — BP 159/94 | HR 102 | Temp 98.5°F | Resp 18 | Ht 68.0 in | Wt 175.1 lb

## 2015-07-12 DIAGNOSIS — C50411 Malignant neoplasm of upper-outer quadrant of right female breast: Secondary | ICD-10-CM

## 2015-07-12 DIAGNOSIS — Z170 Estrogen receptor positive status [ER+]: Secondary | ICD-10-CM | POA: Diagnosis not present

## 2015-07-12 DIAGNOSIS — C50919 Malignant neoplasm of unspecified site of unspecified female breast: Secondary | ICD-10-CM

## 2015-07-12 DIAGNOSIS — Z86711 Personal history of pulmonary embolism: Secondary | ICD-10-CM | POA: Diagnosis not present

## 2015-07-12 DIAGNOSIS — C50911 Malignant neoplasm of unspecified site of right female breast: Secondary | ICD-10-CM

## 2015-07-12 DIAGNOSIS — Z86718 Personal history of other venous thrombosis and embolism: Secondary | ICD-10-CM | POA: Diagnosis not present

## 2015-07-12 LAB — CBC WITH DIFFERENTIAL/PLATELET
BASO%: 0.6 % (ref 0.0–2.0)
Basophils Absolute: 0 10*3/uL (ref 0.0–0.1)
EOS ABS: 0 10*3/uL (ref 0.0–0.5)
EOS%: 0.3 % (ref 0.0–7.0)
HCT: 43.4 % (ref 34.8–46.6)
HEMOGLOBIN: 14.8 g/dL (ref 11.6–15.9)
LYMPH#: 2.2 10*3/uL (ref 0.9–3.3)
LYMPH%: 31 % (ref 14.0–49.7)
MCH: 34.1 pg — ABNORMAL HIGH (ref 25.1–34.0)
MCHC: 34.1 g/dL (ref 31.5–36.0)
MCV: 100.2 fL (ref 79.5–101.0)
MONO#: 0.5 10*3/uL (ref 0.1–0.9)
MONO%: 7.6 % (ref 0.0–14.0)
NEUT%: 60.5 % (ref 38.4–76.8)
NEUTROS ABS: 4.3 10*3/uL (ref 1.5–6.5)
PLATELETS: 195 10*3/uL (ref 145–400)
RBC: 4.33 10*6/uL (ref 3.70–5.45)
RDW: 12.3 % (ref 11.2–14.5)
WBC: 7.1 10*3/uL (ref 3.9–10.3)

## 2015-07-12 LAB — COMPREHENSIVE METABOLIC PANEL (CC13)
ALT: 17 U/L (ref 0–55)
AST: 17 U/L (ref 5–34)
Albumin: 3.8 g/dL (ref 3.5–5.0)
Alkaline Phosphatase: 143 U/L (ref 40–150)
Anion Gap: 9 mEq/L (ref 3–11)
BUN: 14.9 mg/dL (ref 7.0–26.0)
CO2: 28 mEq/L (ref 22–29)
Calcium: 10.1 mg/dL (ref 8.4–10.4)
Chloride: 103 mEq/L (ref 98–109)
Creatinine: 1 mg/dL (ref 0.6–1.1)
EGFR: 79 mL/min/{1.73_m2} — AB (ref >90)
GLUCOSE: 99 mg/dL (ref 70–140)
Potassium: 4.2 mEq/L (ref 3.5–5.1)
SODIUM: 140 meq/L (ref 136–145)
Total Bilirubin: 0.41 mg/dL (ref 0.20–1.20)
Total Protein: 7.6 g/dL (ref 6.4–8.3)

## 2015-07-12 NOTE — Progress Notes (Signed)
ID: Jenna Gonzales   DOB: [birth year]  MR#: [id number]  YQI#:[id number]  PCP: Lucretia Kern., DO GYN: [name], MD SU: Excell Seltzer, MD OTHER MD:  CHIEF COMPLAINTS:  1) Hx of Right Breast Cancer      2) DVT/PE   CURRENT THERAPY: goserelin injections  BREAST CANCER HISTORY: From the original intake note:  The patient saw Dr. Garwin Brothers for a routine gynecologic evaluation, and Dr. Garwin Brothers did a breast exam which showed a palpable mass in the right breast.  She was referred for a diagnostic mammography 11/03/2009, and Dr. Claudie Revering was able to confirm a palpable mass in the 10 o'clock position of the right breast 7 cm from the nipple.  There was no palpable adenopathy.  By mammography the breasts were dense, but there was a spiculated mass in the upper outer right breast corresponding to the palpable mass by ultrasound as measured 2.1 cm.  It was hypoechoic, irregular, and poorly defined.  Ultrasound of the right axilla demonstrated several normal sized lymph nodes, several of them showing cortical thickening.    With this information, the patient was brought back on 11/19 for biopsy.  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  The lymph node biopsy obtained at the same time was negative.  The tumor was ER positive at 99%, PR positive at 20% with a low proliferation marker at 12%, and HER-2/neu was not amplified with a ratio of 1.45.  Bilateral breast MRIs were obtained 11/22.  This confirmed a 2.2 cm irregularly marginated enhancing mass in the upper outer right breast with a clip in the mass.  There were no other masses, and aside from post biopsy changes in the right axilla, there were no abnormal appearing lymph nodes.    The patient proceeded to right lumpectomy and sentinel lymph node sampling 12/09 under Dr. Excell Seltzer because the two sentinel lymph nodes provided positive for cancer.  Dr. Excell Seltzer proceeded to a  right axillary lymph node dissection in the same procedure.  The final pathology [id number]) showed an invasive ductal carcinoma, grade 1, with negative although close margins (the closest was 1 mm posteriorly), with evidence of lymphovascular invasion and involving 2 out of 17 lymph nodes sampled (the 2 sentinel lymph nodes).   Her subsequent history is as detailed below.  INTERVAL HISTORY: Jenna Gonzales returns today for follow-up of her breast cancer. She continues on anastrozole. She tolerates it generally well. Arthralgias and myalgias or not a major concern. She also receives goserelin every 3 months. She has no problems with those injections and in fact very much wants to continue them even though we are stopping the anastrozole today.  REVIEW OF SYSTEMS: Jenna Gonzales has some problems with insomnia, partly views to work stress. She is not exercising regularly. She has rare headaches. Hot flashes are moderate. A detailed review of systems today was otherwise stable  PAST MEDICAL HISTORY: Past Medical History  Diagnosis Date  . Cellulitis 11/29/2011  . History of breast cancer   . Allergy   . Cancer 12/23/2008    Breast Cancer R; s/p lumpectomy with radiation, chemottherapy  . Hypertension   . Pulmonary embolus   . DVT (deep venous thrombosis)   . GERD (gastroesophageal reflux disease)   . Hypertension   Mild GERD, history of a heart murmur, which was not appreciated today, history of borderline hypertension not under treatment, history of irritable bowel disease, and history of minimal arthritic changes involving particularly the  index finger of the right hand.  PAST SURGICAL HISTORY: Past Surgical History  Procedure Laterality Date  . Breast lumpectomy  11/2009    right; s/p radiation, chemotherapy    FAMILY HISTORY Family History  Problem Relation Age of Onset  . Leukemia Father   . Arthritis    . Heart disease    . Hypertension    . Kidney disease    . Diabetes    . Diabetes Other    The patient's father died from leukemia at the age of 43 (I cannot tell from the history if this was chronic or acute).  The patient's mother is alive at age 58.  The patient has 2 sisters and 1 brother, all here in [place].  She also has 2 half brothers living in [place].  There is no history of breast or ovarian cancer in the family.  GYNECOLOGIC HISTORY: (Updated 02/24/2014) She is GX, P1.  First pregnancy to term age 44.  Last menstrual period was in March 2011 with the initiation of chemotherapy. Hormone levels drawn in October 2014 showed patient to  be premenopausal.   SOCIAL HISTORY:    (Updated 02/24/2014) The patient previously worked at [employer] in [employer].  She worked for the [employer] for 8 years. She now works for [employer] and she is thinking of changing work again. 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 She is single. Her son Dahrahn lives with her.  He "could never finish school," and does not have a job at present.  He does have 2 daughters who live with their mother here in [place].  The patient attends [employer].    ADVANCED DIRECTIVES: Not in place  HEALTH MAINTENANCE: (Updated 02/24/2014) History  Substance Use Topics  . Smoking status: Never Smoker   . Smokeless tobacco: Never Used  . Alcohol Use: Yes     Comment: socially     Colonoscopy: Never  PAP: UTD/Dr. Garwin Brothers  Bone density: Never/Scheduled for 03/11/2014  Lipid panel: Not on file   Allergies  Allergen Reactions  . Tamoxifen     Blood clots    Current Outpatient Prescriptions  Medication Sig Dispense Refill  . anastrozole (ARIMIDEX) 1 MG tablet TAKE 1 TABLET BY MOUTH DAILY. BEGIN 1 DAY AFTER FIRST ZOLADEX INJECTION. 30 tablet 4  . cetirizine (ZYRTEC) 10 MG tablet Take 10 mg by mouth daily as needed for allergies.    . fexofenadine (ALLEGRA) 180 MG tablet Take 180 mg by mouth daily.     No current  facility-administered medications for this visit.    OBJECTIVE: Young African American woman who appears stated age 44 Vitals:   07/12/15 1251  BP: 159/94  Pulse: 102  Temp: 98.5 F (36.9 C)  Resp: 18     Body mass index is 26.63 kg/(m^2).    ECOG FS: 0 Filed Weights   07/12/15 1251  Weight: 175 lb 1.6 oz (79.425 kg)   Sclerae unicteric, pupils round and equal Oropharynx clear and moist-- no thrush or other lesions No cervical or supraclavicular adenopathy Lungs no rales or rhonchi Heart regular rate and rhythm Abd soft, nontender, positive bowel sounds MSK no focal spinal tenderness, no upper extremity lymphedema Neuro: nonfocal, well oriented, appropriate affect Breasts: The right breast is status post lumpectomy and radiation. There is no evidence of local recurrence or the right axilla is benign. The left breast is unremarkable   LAB RESULTS:  Lab Results  Component Value Date   WBC 7.1 07/12/2015   NEUTROABS 4.3 07/12/2015   HGB 14.8 07/12/2015   HCT 43.4 07/12/2015   MCV 100.2 07/12/2015   PLT 195 07/12/2015      Chemistry      Component Value Date/Time   NA 143 02/08/2015 0806   NA 144 06/09/2014 1246   K 3.9 02/08/2015 0806   K 4.2 06/09/2014 1246   CL 102 06/09/2014 1246   CL 106 01/19/2013 1507   CO2 28 02/08/2015 0806   CO2 26 06/09/2014 1246   BUN 12.8 02/08/2015 0806   BUN 12 06/09/2014 1246   CREATININE 0.9 02/08/2015 0806   CREATININE 0.85 06/09/2014 1246      Component Value Date/Time   CALCIUM 9.8 02/08/2015 0806   CALCIUM 11.0* 06/09/2014 1246   ALKPHOS 119 02/08/2015 0806   ALKPHOS 80 07/16/2012 1406   AST 13 02/08/2015 0806   AST 18 07/16/2012 1406   ALT 10 02/08/2015 0806   ALT 19 07/16/2012 1406   BILITOT 0.48 02/08/2015 0806   BILITOT 0.4 07/16/2012 1406       STUDIES: CLINICAL DATA: History of right lumpectomy in December 2010. No current complaints.  EXAM: DIGITAL DIAGNOSTIC BILATERAL MAMMOGRAM WITH 3D  TOMOSYNTHESIS AND CAD  COMPARISON: Prior exams  ACR Breast Density Category b: There are scattered areas of fibroglandular density.  FINDINGS: Focal opacity and architectural distortion in the upper outer right breast, with associated dystrophic calcifications, is without significant change reflecting the postsurgical scarring.  There are no discrete masses or other areas of architectural distortion. There are no suspicious calcifications.  Mammographic images were processed with CAD.  IMPRESSION: No evidence of recurrent or new breast malignancy. Benign postsurgical changes on the right.  RECOMMENDATION: Diagnostic mammography in 1 year to complete 7 years of post lumpectomy diagnostic mammography.  I have discussed the findings and recommendations with the patient. Results were also provided in writing at the conclusion of the visit. If applicable, a reminder letter will be sent to the patient regarding the next appointment.  BI-RADS CATEGORY 2: Benign.   Electronically Signed  By: Lajean Manes M.D.  On: 06/09/2015 11:26   ASSESSMENT: 44 y.o.  [place] woman   (1)  status post right lumpectomy and axillary lymph node dissection December of 2010 for a T2 N1, stage IIB invasive ductal carcinoma, grade 1, strongly estrogen receptor positive, moderately progesterone receptor positive, HER-2 negative, with a low proliferation fraction;   (2)  treated adjuvantly with 6 cycles of docetaxel/doxorubicin/cyclophosphamide, completed in May of 2011,   (3)   followed by radiation completed July of 2011 at which time she started tamoxifen.  (4)  tamoxifen was discontinued in October 2014 at which time patient was diagnosed with a right lower extremity DVT and pulmonary embolism.  (5)  anti-coagulation therapy with Xarelto, beginning 09/23/2013  (6)  labs drawn in October 2014 confirmed a premenopausal status. Patient received her first Q3 month injection of  goserelin on 12/02/2013, and then began on anastrozole at 1 mg daily.  (7) completed 5 years of anastrozole as of July 2016. Desires to continue goserelin    PLAN: While we could continue the anastrozole for another 5 years, the benefit of that in the recently reported study is a very small reduction in the risk of recurrence, and specifically the reduction in the risk of outside the breast recurrence is in the 1% range. Accordingly I am comfortable stopping the anastrozole today.  On  the other hand Ariyona once to continue the goserelin. She does not 1 her periods to resume that she is very comfortable with this arrangement that as early have no problem continuing it.  We discussed her new survivorship program and she is very interested in participating.  Accordingly she will see our breast survivorship nurse practitioner in one year. She will continue to have her goserelin every 3 months until she is 30 at which time we will stop and see whether she is already postmenopausal.  Of course she will continue to need a yearly physician breast exam and yearly mammography. I will be glad to see Jenna Gonzales again at any point in the future if the need arises, but as of now I am making her no further return routine appointments with me here. Chauncey Cruel, MD   07/12/2015

## 2015-07-12 NOTE — Telephone Encounter (Signed)
Confirmed appointment for Bone Density.

## 2015-07-19 ENCOUNTER — Other Ambulatory Visit: Payer: BLUE CROSS/BLUE SHIELD

## 2015-07-21 ENCOUNTER — Other Ambulatory Visit: Payer: BLUE CROSS/BLUE SHIELD

## 2015-07-26 ENCOUNTER — Other Ambulatory Visit: Payer: Self-pay | Admitting: Nurse Practitioner

## 2015-07-26 ENCOUNTER — Ambulatory Visit (HOSPITAL_BASED_OUTPATIENT_CLINIC_OR_DEPARTMENT_OTHER): Payer: BLUE CROSS/BLUE SHIELD

## 2015-07-26 DIAGNOSIS — C50919 Malignant neoplasm of unspecified site of unspecified female breast: Secondary | ICD-10-CM

## 2015-07-26 DIAGNOSIS — Z5111 Encounter for antineoplastic chemotherapy: Secondary | ICD-10-CM | POA: Diagnosis not present

## 2015-07-26 DIAGNOSIS — C50411 Malignant neoplasm of upper-outer quadrant of right female breast: Secondary | ICD-10-CM

## 2015-07-26 MED ORDER — GOSERELIN ACETATE 10.8 MG ~~LOC~~ IMPL
10.8000 mg | DRUG_IMPLANT | SUBCUTANEOUS | Status: DC
  Administered 2015-07-26: 10.8 mg via SUBCUTANEOUS
  Filled 2015-07-26: qty 10.8

## 2015-10-10 ENCOUNTER — Telehealth: Payer: Self-pay | Admitting: Oncology

## 2015-10-10 NOTE — Telephone Encounter (Signed)
Called patient and she is aware of her late lab inj per her request

## 2015-10-25 ENCOUNTER — Ambulatory Visit (HOSPITAL_BASED_OUTPATIENT_CLINIC_OR_DEPARTMENT_OTHER): Payer: Self-pay

## 2015-10-25 ENCOUNTER — Ambulatory Visit: Payer: BLUE CROSS/BLUE SHIELD

## 2015-10-25 VITALS — BP 151/88 | HR 98 | Temp 98.2°F | Resp 20

## 2015-10-25 DIAGNOSIS — C50919 Malignant neoplasm of unspecified site of unspecified female breast: Secondary | ICD-10-CM

## 2015-10-25 DIAGNOSIS — C50411 Malignant neoplasm of upper-outer quadrant of right female breast: Secondary | ICD-10-CM

## 2015-10-25 DIAGNOSIS — Z5111 Encounter for antineoplastic chemotherapy: Secondary | ICD-10-CM

## 2015-10-25 MED ORDER — GOSERELIN ACETATE 10.8 MG ~~LOC~~ IMPL
10.8000 mg | DRUG_IMPLANT | SUBCUTANEOUS | Status: DC
  Administered 2015-10-25: 10.8 mg via SUBCUTANEOUS
  Filled 2015-10-25: qty 10.8

## 2015-10-25 NOTE — Patient Instructions (Signed)
Goserelin injection What is this medicine? GOSERELIN (GOE se rel in) is similar to a hormone found in the body. It lowers the amount of sex hormones that the body makes. Men will have lower testosterone levels and women will have lower estrogen levels while taking this medicine. In men, this medicine is used to treat prostate cancer; the injection is either given once per month or once every 12 weeks. A once per month injection (only) is used to treat women with endometriosis, dysfunctional uterine bleeding, or advanced breast cancer. This medicine may be used for other purposes; ask your health care provider or pharmacist if you have questions. COMMON BRAND NAME(S): Zoladex What should I tell my health care provider before I take this medicine? They need to know if you have any of these conditions (some only apply to women): -diabetes -heart disease or previous heart attack -high blood pressure -high cholesterol -kidney disease -osteoporosis or low bone density -problems passing urine -spinal cord injury -stroke -tobacco smoker -an unusual or allergic reaction to goserelin, hormone therapy, other medicines, foods, dyes, or preservatives -pregnant or trying to get pregnant -breast-feeding How should I use this medicine? This medicine is for injection under the skin. It is given by a health care professional in a hospital or clinic setting. Men receive this injection once every 4 weeks or once every 12 weeks. Women will only receive the once every 4 weeks injection. Talk to your pediatrician regarding the use of this medicine in children. Special care may be needed. Overdosage: If you think you have taken too much of this medicine contact a poison control center or emergency room at once. NOTE: This medicine is only for you. Do not share this medicine with others. What if I miss a dose? It is important not to miss your dose. Call your doctor or health care professional if you are unable to  keep an appointment. What may interact with this medicine? -female hormones like estrogen -herbal or dietary supplements like black cohosh, chasteberry, or DHEA -female hormones like testosterone -prasterone This list may not describe all possible interactions. Give your health care provider a list of all the medicines, herbs, non-prescription drugs, or dietary supplements you use. Also tell them if you smoke, drink alcohol, or use illegal drugs. Some items may interact with your medicine. What should I watch for while using this medicine? Visit your doctor or health care professional for regular checks on your progress. Your symptoms may appear to get worse during the first weeks of this therapy. Tell your doctor or healthcare professional if your symptoms do not start to get better or if they get worse after this time. Your bones may get weaker if you take this medicine for a long time. If you smoke or frequently drink alcohol you may increase your risk of bone loss. A family history of osteoporosis, chronic use of drugs for seizures (convulsions), or corticosteroids can also increase your risk of bone loss. Talk to your doctor about how to keep your bones strong. This medicine should stop regular monthly menstration in women. Tell your doctor if you continue to menstrate. Women should not become pregnant while taking this medicine or for 12 weeks after stopping this medicine. Women should inform their doctor if they wish to become pregnant or think they might be pregnant. There is a potential for serious side effects to an unborn child. Talk to your health care professional or pharmacist for more information. Do not breast-feed an infant while taking   this medicine. Men should inform their doctors if they wish to father a child. This medicine may lower sperm counts. Talk to your health care professional or pharmacist for more information. What side effects may I notice from receiving this  medicine? Side effects that you should report to your doctor or health care professional as soon as possible: -allergic reactions like skin rash, itching or hives, swelling of the face, lips, or tongue -bone pain -breathing problems -changes in vision -chest pain -feeling faint or lightheaded, falls -fever, chills -pain, swelling, warmth in the leg -pain, tingling, numbness in the hands or feet -swelling of the ankles, feet, hands -trouble passing urine or change in the amount of urine -unusually high or low blood pressure -unusually weak or tired Side effects that usually do not require medical attention (report to your doctor or health care professional if they continue or are bothersome): -change in sex drive or performance -changes in breast size in both males and females -changes in emotions or moods -headache -hot flashes -irritation at site where injected -loss of appetite -skin problems like acne, dry skin -vaginal dryness This list may not describe all possible side effects. Call your doctor for medical advice about side effects. You may report side effects to FDA at 1-800-FDA-1088. Where should I keep my medicine? This drug is given in a hospital or clinic and will not be stored at home. NOTE: This sheet is a summary. It may not cover all possible information. If you have questions about this medicine, talk to your doctor, pharmacist, or health care provider.  2014, [employer] Standard. (2009-04-25 13:28:29)  

## 2015-11-15 ENCOUNTER — Ambulatory Visit: Payer: BLUE CROSS/BLUE SHIELD

## 2015-12-21 ENCOUNTER — Ambulatory Visit: Payer: BLUE CROSS/BLUE SHIELD

## 2015-12-29 ENCOUNTER — Telehealth: Payer: Self-pay | Admitting: Oncology

## 2015-12-29 NOTE — Telephone Encounter (Signed)
Patient had called to schedule her inj,called and left a message with her appt

## 2016-01-17 ENCOUNTER — Ambulatory Visit (HOSPITAL_BASED_OUTPATIENT_CLINIC_OR_DEPARTMENT_OTHER): Payer: Self-pay

## 2016-01-17 VITALS — BP 139/96 | HR 92 | Temp 98.2°F

## 2016-01-17 DIAGNOSIS — Z23 Encounter for immunization: Secondary | ICD-10-CM

## 2016-01-17 DIAGNOSIS — C50411 Malignant neoplasm of upper-outer quadrant of right female breast: Secondary | ICD-10-CM

## 2016-01-17 DIAGNOSIS — C50919 Malignant neoplasm of unspecified site of unspecified female breast: Secondary | ICD-10-CM

## 2016-01-17 DIAGNOSIS — Z5111 Encounter for antineoplastic chemotherapy: Secondary | ICD-10-CM

## 2016-01-17 MED ORDER — GOSERELIN ACETATE 10.8 MG ~~LOC~~ IMPL
10.8000 mg | DRUG_IMPLANT | SUBCUTANEOUS | Status: DC
  Administered 2016-01-17: 10.8 mg via SUBCUTANEOUS
  Filled 2016-01-17: qty 10.8

## 2016-01-17 MED ORDER — INFLUENZA VAC SPLIT QUAD 0.5 ML IM SUSY
0.5000 mL | PREFILLED_SYRINGE | Freq: Once | INTRAMUSCULAR | Status: AC
  Administered 2016-01-17: 0.5 mL via INTRAMUSCULAR
  Filled 2016-01-17: qty 0.5

## 2016-04-10 ENCOUNTER — Ambulatory Visit (HOSPITAL_BASED_OUTPATIENT_CLINIC_OR_DEPARTMENT_OTHER): Payer: Commercial Managed Care - PPO

## 2016-04-10 VITALS — BP 155/99 | HR 110 | Temp 98.4°F

## 2016-04-10 DIAGNOSIS — C50919 Malignant neoplasm of unspecified site of unspecified female breast: Secondary | ICD-10-CM | POA: Diagnosis not present

## 2016-04-10 DIAGNOSIS — Z5111 Encounter for antineoplastic chemotherapy: Secondary | ICD-10-CM | POA: Diagnosis not present

## 2016-04-10 MED ORDER — GOSERELIN ACETATE 10.8 MG ~~LOC~~ IMPL
10.8000 mg | DRUG_IMPLANT | SUBCUTANEOUS | Status: DC
  Administered 2016-04-10: 10.8 mg via SUBCUTANEOUS
  Filled 2016-04-10: qty 10.8

## 2016-04-10 NOTE — Progress Notes (Signed)
Writer spoke with patient who states that she had a "period at the beginning of the month, lasting 7 days."  This is the first time she has bled since being on the zoladex- this is her 10th treatment today.  Patient states since then she has bled periodically a day or two then the bleeding goes away or is only present on tissue paper after using the bathroom. Patient was encouraged to see her GYN as soon as possible, which she states that she is overdue to see for a regular check up and a pap smear.  Patient also admits to having a large amount of stress at work and states her BP has been elevated. Patient will contact her GYN who is out of town until May 5.  Patient states that she will call immediately and set up an appt to see the MD as soon as she comes back.  Patient will update Dr. Jana Hakim as well as asking the MD to let Dr. Jana Hakim know why patient is bleeding.

## 2016-04-16 ENCOUNTER — Encounter: Payer: Self-pay | Admitting: Obstetrics and Gynecology

## 2016-04-16 ENCOUNTER — Ambulatory Visit (INDEPENDENT_AMBULATORY_CARE_PROVIDER_SITE_OTHER): Payer: Commercial Managed Care - PPO | Admitting: Obstetrics and Gynecology

## 2016-04-16 VITALS — BP 144/90 | HR 100 | Resp 16 | Ht 68.5 in | Wt 170.0 lb

## 2016-04-16 DIAGNOSIS — N950 Postmenopausal bleeding: Secondary | ICD-10-CM

## 2016-04-16 DIAGNOSIS — Z853 Personal history of malignant neoplasm of breast: Secondary | ICD-10-CM | POA: Diagnosis not present

## 2016-04-16 NOTE — Patient Instructions (Signed)

## 2016-04-16 NOTE — Progress Notes (Signed)
Patient ID: Jenna Gonzales, female   DOB: June 24, 1971, 45 y.o.   MRN: [id number] 45 y.o. G1P1. Single bi racial F here for abnormal vaginal bleeding. The patient has a h/o right breast cancer in 2010. She is s/p lumpectomy, chemo, radiation, tamoxifen (had a PE on tamoxifen), then Arimidex. Now she is just on  Zoladex until she is confirmed PMP. She stopped having her cycle in 2011. She started bleeding on 3/29, light for 5 days, not saturating through pads. Then she started bleeding on April 15, light bleeding on and off for several days. Now just brown spotting. She has sporadic, mild cramping. Not sexually active.  She is very anxious, afraid she has cancer again. She is also incredibly stressed out at work.     No LMP recorded. Patient is postmenopausal.          Sexually active: No.  The current method of family planning is none.    Exercising: No.  The patient does not participate in regular exercise at present. Smoker:  no  Health Maintenance: Pap:  unsure History of abnormal Pap:  no MMG:  06-09-15 WNL - HX of breast CA Colonoscopy:  Never BMD:   Never TDaP:  unsure Gardasil: N/A   reports that she has never smoked. She has never used smokeless tobacco. She reports that she drinks alcohol. She reports that she does not use illicit drugs.She works in Orthoptist at [employer], very stressful.   Past Medical History  Diagnosis Date  . Cellulitis 11/29/2011  . History of breast cancer   . Allergy   . Cancer ([place]) 12/23/2008    Breast Cancer R; s/p lumpectomy with radiation, chemottherapy  . Hypertension   . Pulmonary embolus ([place])   . DVT (deep venous thrombosis) ([place])   . GERD (gastroesophageal reflux disease)   . Hypertension   . Anxiety   Not currently on blood pressure medication. When she is not very stressed her BP is okay.  Past Surgical History  Procedure Laterality Date  . Breast lumpectomy  11/2009    right; s/p radiation, chemotherapy    Current Outpatient  Prescriptions  Medication Sig Dispense Refill  . cetirizine (ZYRTEC) 10 MG tablet Take 10 mg by mouth daily as needed for allergies.    . fexofenadine (ALLEGRA) 180 MG tablet Take 180 mg by mouth daily.    Marland Kitchen goserelin (ZOLADEX) 10.8 MG injection Inject 10.8 mg into the skin every 3 (three) months.     No current facility-administered medications for this visit.   Facility-Administered Medications Ordered in Other Visits  Medication Dose Route Frequency Provider Last Rate Last Dose  . goserelin (ZOLADEX) injection 10.8 mg  10.8 mg Subcutaneous Q90 days Laurie Panda, NP   10.8 mg at 10/25/15 1625    Family History  Problem Relation Age of Onset  . Leukemia Father   . Arthritis    . Heart disease    . Hypertension    . Kidney disease    . Diabetes    . Diabetes Other     Review of Systems  Constitutional: Negative.   HENT: Negative.   Eyes: Negative.   Respiratory: Negative.   Cardiovascular: Negative.   Gastrointestinal: Negative.   Endocrine: Negative.   Genitourinary: Negative.        Vaginal bleeding  Musculoskeletal: Negative.   Skin: Negative.   Allergic/Immunologic: Negative.   Neurological: Negative.   Psychiatric/Behavioral: The patient is nervous/anxious.     Exam:   BP 144/90 mmHg  Pulse 100  Resp 16  Ht 5' 8.5" (1.74 m)  Wt 170 lb (77.111 kg)  BMI 25.47 kg/m2  Weight change: @WEIGHTCHANGE @ Height:   Height: 5' 8.5" (174 cm)  Ht Readings from Last 3 Encounters:  04/16/16 5' 8.5" (1.74 m)  07/12/15 5\' 8"  (1.727 m)  02/08/15 5\' 8"  (1.727 m)    General appearance: alert, cooperative and appears stated age Abdomen: soft, non-tender; bowel sounds normal; no masses,  no organomegaly   Pelvic: External genitalia:  no lesions              Urethra:  normal appearing urethra with no masses, tenderness or lesions              Bartholins and Skenes: normal                 Vagina: normal appearing vagina with normal color and discharge, no lesions               Cervix: no lesions               Bimanual Exam:  Uterus:  normal size, contour, position, consistency, mobility, non-tender              Adnexa: no mass, fullness, tenderness                 The risks of endometrial biopsy were reviewed and a consent was obtained.  A speculum was placed in the vagina and the cervix was cleansed with betadine. The mini-pipelle was placed into the endometrial cavity. The uterus sounded to 7-8 cm. The endometrial biopsy was performed, minimal tissue was obtained. The speculum weas removed. There were no complications.   Chaperone was present for exam.  A:  Abnormal uterine bleeding, she is on Zoladex, chemical menopause. Was on tamoxifen in the past  P:   Endometrial biopsy done  Return for an ultrasound, possible sonohysterogram later this week  She will need an annual and pap  Letter given for work, she is very anxious, stressed.

## 2016-04-17 ENCOUNTER — Telehealth: Payer: Self-pay | Admitting: Obstetrics and Gynecology

## 2016-04-17 ENCOUNTER — Other Ambulatory Visit: Payer: Self-pay | Admitting: *Deleted

## 2016-04-17 DIAGNOSIS — N950 Postmenopausal bleeding: Secondary | ICD-10-CM

## 2016-04-17 NOTE — Telephone Encounter (Signed)
Called patient to review benefits for a recommended procedure. Left Voicemail requesting a call back. °

## 2016-04-18 ENCOUNTER — Ambulatory Visit (INDEPENDENT_AMBULATORY_CARE_PROVIDER_SITE_OTHER): Payer: Commercial Managed Care - PPO | Admitting: Obstetrics and Gynecology

## 2016-04-18 ENCOUNTER — Encounter: Payer: Self-pay | Admitting: Obstetrics and Gynecology

## 2016-04-18 ENCOUNTER — Ambulatory Visit (INDEPENDENT_AMBULATORY_CARE_PROVIDER_SITE_OTHER): Payer: Commercial Managed Care - PPO

## 2016-04-18 ENCOUNTER — Other Ambulatory Visit: Payer: Self-pay | Admitting: Obstetrics and Gynecology

## 2016-04-18 VITALS — BP 120/90 | HR 88 | Resp 16 | Wt 169.0 lb

## 2016-04-18 DIAGNOSIS — N939 Abnormal uterine and vaginal bleeding, unspecified: Secondary | ICD-10-CM

## 2016-04-18 NOTE — Telephone Encounter (Signed)
Call to patient. Confirmed surgery date of 04-29-16 at 0830 at [facility]. Surgery instruction sheet reviewed and printed copy mailed to patient with booklet from surgery center. See copy scanned to chart. Instructed to call back as needed prior to procedure.   Routing to provider for final review. Patient agreeable to disposition. Will close encounter.

## 2016-04-18 NOTE — Progress Notes (Signed)
Patient ID: Jenna Gonzales, female   DOB: 11/30/1971, 45 y.o.   MRN: [id number] GYNECOLOGY  VISIT   HPI: 45 y.o.   Single  Bi Racial  female   G1P1001 with No LMP recorded. Patient is postmenopausal.   here for sonohysterogram for PMP bleeding. She has a h/o breast cancer and has been menopausal since chemotherapy. She had a recent episode of light bleeding for 1 week. Endometrial biopsy with proliferative endometrium.   GYNECOLOGIC HISTORY: No LMP recorded. Patient is postmenopausal. Contraception:postmenopause Menopausal hormone therapy: none         OB History    Gravida Para Term Preterm AB TAB SAB Ectopic Multiple Living   1 1 1       1          Patient Active Problem List   Diagnosis Date Noted  . Arthralgia 02/08/2015  . Osteopenia 08/11/2014  . HTN (hypertension) 12/20/2013  . PE (pulmonary embolism) 09/22/2013  . DVT (deep venous thrombosis) ([place]) 09/22/2013  . Allergic rhinitis 09/29/2012  . Breast cancer ([place]) 01/07/2012  . GERD 11/15/2008  . 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 Cellulitis 11/29/2011  . History of breast cancer   . Allergy   . Cancer ([place]) 12/23/2008    Breast Cancer R; s/p lumpectomy with radiation, chemottherapy  . Hypertension   . Pulmonary embolus ([place])   . DVT (deep venous thrombosis) ([place])   . GERD (gastroesophageal reflux disease)   . Hypertension   . Anxiety     Past Surgical History  Procedure Laterality Date  . Breast lumpectomy  11/2009    right; s/p radiation, chemotherapy    Current Outpatient Prescriptions  Medication Sig Dispense Refill  . cetirizine (ZYRTEC) 10 MG tablet Take 10 mg by mouth daily as needed for allergies.    . fexofenadine (ALLEGRA) 180 MG tablet Take 180 mg by mouth daily.    Marland Kitchen goserelin (ZOLADEX) 10.8 MG injection Inject 10.8 mg into the skin every 3 (three) months.     No current facility-administered medications for this visit.   Facility-Administered  Medications Ordered in Other Visits  Medication Dose Route Frequency Provider Last Rate Last Dose  . goserelin (ZOLADEX) injection 10.8 mg  10.8 mg Subcutaneous Q90 days Laurie Panda, NP   10.8 mg at 10/25/15 1625     ALLERGIES: Tamoxifen  Family History  Problem Relation Age of Onset  . Leukemia Father   . Arthritis    . Heart disease    . Hypertension    . Kidney disease    . Diabetes    . Diabetes Other     Social History   Social History  . Marital Status: Single    Spouse Name: N/A  . Number of Children: N/A  . Years of Education: N/A   Occupational History  . Not on file.   Social History Main Topics  . Smoking status: Never Smoker   . Smokeless tobacco: Never Used  . Alcohol Use: 0.0 oz/week    0 Standard drinks or equivalent per week     Comment: socially  . Drug Use: No  . Sexual Activity: No   Other Topics Concern  . 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   HENT: Negative.   Eyes: Negative.   Respiratory: Negative.   Cardiovascular: Negative.   Gastrointestinal: Negative.   Genitourinary: Negative.   Musculoskeletal: Negative.   Skin: Negative.   Neurological: Negative.   Endo/Heme/Allergies: Negative.   Psychiatric/Behavioral: Negative.     PHYSICAL EXAMINATION:    BP 120/90 mmHg  Pulse 88  Resp 16  Wt 169 lb (76.658 kg)    General appearance: alert, cooperative and appears stated age  Pelvic: External genitalia:  no lesions              Urethra:  normal appearing urethra with no masses, tenderness or lesions              Bartholins and Skenes: normal                 Vagina: normal appearing vagina with normal color and discharge, no lesions              Cervix without  lesions   Sonohysterogram The procedure and risks of the procedure were reviewed with the patient, consent form was signed. A speculum was placed in the vagina and the cervix was cleansed with betadine. The sonohysterogram catheter was inserted into the uterine cavity without difficulty. Saline was infused under direct observation with the ultrasound. At least 2 intracavitary defects were noted. Consistent with endometrial polyps. The largest was 3.4 cm. The catheter was removed.    Chaperone was present for exam.  ASSESSMENT PMP bleeding, endometrial biopsy with proliferative endometrium Sononhysterogram with at least 2 intracavitary defects c/w polyps (large)    PLAN Plan: hysteroscopy, polypectomy, dilation and curettage. Reviewed risks, including: bleeding, infection, uterine perforation, fluid overload, need to stop procedure and need for further sugery Will plan her annual/pap at her post op visit (not done today because of the gel used with the ultrasound)    An After Visit Summary was printed and given to the patient.  15 minutes face to face time of which over 50% was spent in counseling.

## 2016-04-18 NOTE — Telephone Encounter (Signed)
Call to patient to discuss surgery date options. Patient agreeable to 04-29-16. Advised will proceed with scheduling and precert and call back once confirmed.

## 2016-04-19 ENCOUNTER — Encounter (HOSPITAL_BASED_OUTPATIENT_CLINIC_OR_DEPARTMENT_OTHER): Payer: Self-pay | Admitting: *Deleted

## 2016-04-19 NOTE — Progress Notes (Signed)
NPO AFTER MN.  ARRIVE AT [id number].  NEEDS HG AND EKG.  PRE-OP ORDERS PENDING.

## 2016-04-22 NOTE — H&P (Signed)
Patient ID: Jenna Gonzales, female DOB: 01/03/1971, 45 y.o. MRN: [id number] 45 y.o. G1P1. Single bi racial F here for abnormal vaginal bleeding. The patient has a h/o right breast cancer in 2010. She is s/p lumpectomy, chemo, radiation, tamoxifen (had a PE on tamoxifen), then Arimidex. Now she is just on Zoladex until she is confirmed PMP. She stopped having her cycle in 2011. She started bleeding on 3/29, light for 5 days, not saturating through pads. Then she started bleeding on April 15, light bleeding on and off for several days. Now just brown spotting. She has sporadic, mild cramping. Not sexually active.  She is very anxious, afraid she has cancer again. She is also incredibly stressed out at work.     No LMP recorded. Patient is postmenopausal.  Sexually active: No.  The current method of family planning is none.  Exercising: No. The patient does not participate in regular exercise at present. Smoker: no  Health Maintenance: Pap: unsure History of abnormal Pap: no MMG: 06-09-15 WNL - HX of breast CA Colonoscopy: Never BMD: Never TDaP: unsure Gardasil: N/A   reports that she has never smoked. She has never used smokeless tobacco. She reports that she drinks alcohol. She reports that she does not use illicit drugs.She works in Orthoptist at [employer], very stressful.   Past Medical History  Diagnosis Date  . Cellulitis 11/29/2011  . History of breast cancer   . Allergy   . Cancer ([place]) 12/23/2008    Breast Cancer R; s/p lumpectomy with radiation, chemottherapy  . Hypertension   . Pulmonary embolus ([place])   . DVT (deep venous thrombosis) ([place])   . GERD (gastroesophageal reflux disease)   . Hypertension   . Anxiety   Not currently on blood pressure medication. When she is not very stressed her BP is okay.  Past Surgical History  Procedure Laterality Date  . Breast lumpectomy  11/2009    right; s/p  radiation, chemotherapy    Current Outpatient Prescriptions  Medication Sig Dispense Refill  . cetirizine (ZYRTEC) 10 MG tablet Take 10 mg by mouth daily as needed for allergies.    . fexofenadine (ALLEGRA) 180 MG tablet Take 180 mg by mouth daily.    Marland Kitchen goserelin (ZOLADEX) 10.8 MG injection Inject 10.8 mg into the skin every 3 (three) months.     No current facility-administered medications for this visit.   Facility-Administered Medications Ordered in Other Visits  Medication Dose Route Frequency Provider Last Rate Last Dose  . goserelin (ZOLADEX) injection 10.8 mg 10.8 mg Subcutaneous Q90 days Laurie Panda, NP  10.8 mg at 10/25/15 1625    Family History  Problem Relation Age of Onset  . Leukemia Father   . Arthritis    . Heart disease    . Hypertension    . Kidney disease    . Diabetes    . Diabetes Other     Review of Systems  Constitutional: Negative.  HENT: Negative.  Eyes: Negative.  Respiratory: Negative.  Cardiovascular: Negative.  Gastrointestinal: Negative.  Endocrine: Negative.  Genitourinary: Negative.   Vaginal bleeding  Musculoskeletal: Negative.  Skin: Negative.  Allergic/Immunologic: Negative.  Neurological: Negative.  Psychiatric/Behavioral: The patient is nervous/anxious.    Exam:  BP 144/90 mmHg  Pulse 100  Resp 16  Ht 5' 8.5" (1.74 m)  Wt 170 lb (77.111 kg)  BMI 25.47 kg/m2 Weight change: @WEIGHTCHANGE @ Height: Height: 5' 8.5" (174 cm)  Ht Readings from Last 3 Encounters:  04/16/16 5' 8.5" (1.74 m)  07/12/15 5\' 8"  (1.727 m)  02/08/15 5\' 8"  (1.727 m)    General appearance: alert, cooperative and appears stated age Abdomen: soft, non-tender; bowel sounds normal; no masses, no organomegaly   Pelvic: External genitalia: no lesions  Urethra: normal appearing urethra with no masses, tenderness or  lesions  Bartholins and Skenes: normal   Vagina: normal appearing vagina with normal color and discharge, no lesions  Cervix: no lesions   Bimanual Exam: Uterus: normal size, contour, position, consistency, mobility, non-tender  Adnexa: no mass, fullness, tenderness    The risks of endometrial biopsy were reviewed and a consent was obtained.  A speculum was placed in the vagina and the cervix was cleansed with betadine. The mini-pipelle was placed into the endometrial cavity. The uterus sounded to 7-8 cm. The endometrial biopsy was performed, minimal tissue was obtained. The speculum weas removed. There were no complications.   Chaperone was present for exam.  A: Abnormal uterine bleeding, she is on Zoladex, chemical menopause. Was on tamoxifen in the past  P: Endometrial biopsy done Return for an ultrasound, possible sonohysterogram later this week She will need an annual and pap Letter given for work, she is very anxious, stressed.       Addendum: endometrial biopsy with proliferative endometrium. Sonohysterogram with endometrial polyps. Plan: hysteroscopy, polypectomy, dilation and curettage. Risks reviewed, including: bleeding, infection, uterine perforation, fluid overload and need for further surgery.

## 2016-04-24 ENCOUNTER — Telehealth: Payer: Self-pay | Admitting: Obstetrics and Gynecology

## 2016-04-24 NOTE — Telephone Encounter (Signed)
Patient is scheduled for a D&C hysteroscopy with myosure on 04/29/2016 with Dr.Jertson. Patient would like to know if she may take generic Zyrtec for her allergies prior to her surgery.

## 2016-04-24 NOTE — Telephone Encounter (Signed)
She can take them up to the day prior to surgery. She can also take them after her surgery if needed

## 2016-04-24 NOTE — Telephone Encounter (Signed)
Patient calling to see if she can take generic Zyrtec for allergies before she upcoming surgery.

## 2016-04-24 NOTE — Telephone Encounter (Signed)
Spoke with patient. Advised of message as seen below from [place]. She is agreeable and verbalizes understanding.  Routing to provider for final review. Patient agreeable to disposition. Will close encounter.

## 2016-04-29 ENCOUNTER — Encounter (HOSPITAL_BASED_OUTPATIENT_CLINIC_OR_DEPARTMENT_OTHER): Payer: Self-pay

## 2016-04-29 ENCOUNTER — Ambulatory Visit (HOSPITAL_BASED_OUTPATIENT_CLINIC_OR_DEPARTMENT_OTHER): Payer: Commercial Managed Care - PPO | Admitting: Anesthesiology

## 2016-04-29 ENCOUNTER — Ambulatory Visit (HOSPITAL_BASED_OUTPATIENT_CLINIC_OR_DEPARTMENT_OTHER)
Admission: RE | Admit: 2016-04-29 | Discharge: 2016-04-29 | Disposition: A | Discharge status: Home / Self Care | Payer: Commercial Managed Care - PPO | Source: Ambulatory Visit | Attending: Obstetrics and Gynecology | Admitting: Obstetrics and Gynecology

## 2016-04-29 ENCOUNTER — Encounter (HOSPITAL_BASED_OUTPATIENT_CLINIC_OR_DEPARTMENT_OTHER)
Admission: RE | Disposition: A | Discharge status: Home / Self Care | Payer: Self-pay | Source: Ambulatory Visit | Attending: Obstetrics and Gynecology

## 2016-04-29 DIAGNOSIS — Z853 Personal history of malignant neoplasm of breast: Secondary | ICD-10-CM | POA: Diagnosis not present

## 2016-04-29 DIAGNOSIS — N840 Polyp of corpus uteri: Secondary | ICD-10-CM | POA: Insufficient documentation

## 2016-04-29 DIAGNOSIS — Z9221 Personal history of antineoplastic chemotherapy: Secondary | ICD-10-CM | POA: Insufficient documentation

## 2016-04-29 DIAGNOSIS — N939 Abnormal uterine and vaginal bleeding, unspecified: Secondary | ICD-10-CM | POA: Diagnosis not present

## 2016-04-29 DIAGNOSIS — I10 Essential (primary) hypertension: Secondary | ICD-10-CM | POA: Diagnosis not present

## 2016-04-29 DIAGNOSIS — N950 Postmenopausal bleeding: Secondary | ICD-10-CM | POA: Diagnosis not present

## 2016-04-29 DIAGNOSIS — Z86711 Personal history of pulmonary embolism: Secondary | ICD-10-CM | POA: Diagnosis not present

## 2016-04-29 DIAGNOSIS — Z86718 Personal history of other venous thrombosis and embolism: Secondary | ICD-10-CM | POA: Diagnosis not present

## 2016-04-29 DIAGNOSIS — Z923 Personal history of irradiation: Secondary | ICD-10-CM | POA: Insufficient documentation

## 2016-04-29 HISTORY — DX: Presence of spectacles and contact lenses: Z97.3

## 2016-04-29 HISTORY — DX: Other specified disorders of bone density and structure, unspecified site: M85.80

## 2016-04-29 HISTORY — DX: Polyp of corpus uteri: N84.0

## 2016-04-29 HISTORY — DX: Personal history of irradiation: Z92.3

## 2016-04-29 HISTORY — DX: Elevated blood-pressure reading, without diagnosis of hypertension: R03.0

## 2016-04-29 HISTORY — DX: Abnormal uterine and vaginal bleeding, unspecified: N93.9

## 2016-04-29 HISTORY — PX: DILATATION & CURETTAGE/HYSTEROSCOPY WITH MYOSURE: SHX6511

## 2016-04-29 HISTORY — DX: Personal history of antineoplastic chemotherapy: Z92.21

## 2016-04-29 HISTORY — DX: Personal history of other venous thrombosis and embolism: Z86.718

## 2016-04-29 HISTORY — DX: Personal history of pulmonary embolism: Z86.711

## 2016-04-29 HISTORY — DX: Allergic rhinitis, unspecified: J30.9

## 2016-04-29 LAB — POCT HEMOGLOBIN-HEMACUE: HEMOGLOBIN: 16.1 g/dL — AB (ref 12.0–15.0)

## 2016-04-29 SURGERY — DILATATION & CURETTAGE/HYSTEROSCOPY WITH MYOSURE
Anesthesia: General

## 2016-04-29 MED ORDER — LIDOCAINE HCL (CARDIAC) 20 MG/ML IV SOLN
INTRAVENOUS | Status: AC
  Filled 2016-04-29: qty 5

## 2016-04-29 MED ORDER — FENTANYL CITRATE (PF) 100 MCG/2ML IJ SOLN
INTRAMUSCULAR | Status: DC | PRN
  Administered 2016-04-29: 25 ug via INTRAVENOUS
  Administered 2016-04-29: 50 ug via INTRAVENOUS
  Administered 2016-04-29: 25 ug via INTRAVENOUS

## 2016-04-29 MED ORDER — DEXAMETHASONE SODIUM PHOSPHATE 10 MG/ML IJ SOLN
INTRAMUSCULAR | Status: DC | PRN
  Administered 2016-04-29: 10 mg via INTRAVENOUS

## 2016-04-29 MED ORDER — LACTATED RINGERS IV SOLN
INTRAVENOUS | Status: DC
  Administered 2016-04-29 (×2): via INTRAVENOUS
  Filled 2016-04-29: qty 1000

## 2016-04-29 MED ORDER — FENTANYL CITRATE (PF) 100 MCG/2ML IJ SOLN
INTRAMUSCULAR | Status: AC
  Filled 2016-04-29: qty 2

## 2016-04-29 MED ORDER — KETOROLAC TROMETHAMINE 30 MG/ML IJ SOLN
INTRAMUSCULAR | Status: AC
  Filled 2016-04-29: qty 1

## 2016-04-29 MED ORDER — LACTATED RINGERS IV SOLN
INTRAVENOUS | Status: DC
  Filled 2016-04-29: qty 1000

## 2016-04-29 MED ORDER — ONDANSETRON HCL 4 MG/2ML IJ SOLN
INTRAMUSCULAR | Status: AC
  Filled 2016-04-29: qty 2

## 2016-04-29 MED ORDER — MIDAZOLAM HCL 5 MG/5ML IJ SOLN: INTRAMUSCULAR | Status: DC | PRN

## 2016-04-29 MED ORDER — SODIUM CHLORIDE 0.9 % IR SOLN
Status: DC | PRN
  Administered 2016-04-29: 2000 mL

## 2016-04-29 MED ORDER — FENTANYL CITRATE (PF) 100 MCG/2ML IJ SOLN
25.0000 ug | INTRAMUSCULAR | Status: DC | PRN
  Administered 2016-04-29: 50 ug via INTRAVENOUS
  Filled 2016-04-29: qty 1

## 2016-04-29 MED ORDER — PROPOFOL 10 MG/ML IV BOLUS
INTRAVENOUS | Status: DC | PRN
  Administered 2016-04-29: 200 mg via INTRAVENOUS

## 2016-04-29 MED ORDER — KETOROLAC TROMETHAMINE 30 MG/ML IJ SOLN
INTRAMUSCULAR | Status: DC | PRN
  Administered 2016-04-29: 30 mg via INTRAVENOUS

## 2016-04-29 MED ORDER — ONDANSETRON HCL 4 MG/2ML IJ SOLN
4.0000 mg | Freq: Once | INTRAMUSCULAR | Status: DC | PRN
  Filled 2016-04-29: qty 2

## 2016-04-29 MED ORDER — PROPOFOL 10 MG/ML IV BOLUS
INTRAVENOUS | Status: AC
  Filled 2016-04-29: qty 20

## 2016-04-29 MED ORDER — DEXAMETHASONE SODIUM PHOSPHATE 10 MG/ML IJ SOLN
INTRAMUSCULAR | Status: AC
  Filled 2016-04-29: qty 1

## 2016-04-29 MED ORDER — ONDANSETRON HCL 4 MG/2ML IJ SOLN
INTRAMUSCULAR | Status: DC | PRN
  Administered 2016-04-29: 4 mg via INTRAVENOUS

## 2016-04-29 MED ORDER — LIDOCAINE HCL 1 % IJ SOLN
INTRAMUSCULAR | Status: DC | PRN
  Administered 2016-04-29: 70 mg via INTRADERMAL

## 2016-04-29 MED ORDER — MIDAZOLAM HCL 5 MG/5ML IJ SOLN
INTRAMUSCULAR | Status: DC | PRN
  Administered 2016-04-29: 2 mg via INTRAVENOUS

## 2016-04-29 MED ORDER — MIDAZOLAM HCL 2 MG/2ML IJ SOLN
INTRAMUSCULAR | Status: AC
  Filled 2016-04-29: qty 2

## 2016-04-29 SURGICAL SUPPLY — 32 items
CANISTER SUCTION 2500CC (MISCELLANEOUS) ×2 IMPLANT
CATH ROBINSON RED A/P 16FR (CATHETERS) ×1 IMPLANT
COVER BACK TABLE 60X90IN (DRAPES) ×2 IMPLANT
DEVICE MYOSURE CLASSIC (MISCELLANEOUS) IMPLANT
DEVICE MYOSURE LITE (MISCELLANEOUS) IMPLANT
DEVICE MYOSURE REACH (MISCELLANEOUS) ×1 IMPLANT
DRAPE HYSTEROSCOPY (DRAPE) ×2 IMPLANT
DRAPE LG THREE QUARTER DISP (DRAPES) ×2 IMPLANT
DRSG TELFA 3X8 NADH (GAUZE/BANDAGES/DRESSINGS) ×2 IMPLANT
ELECT REM PT RETURN 9FT ADLT (ELECTROSURGICAL) ×2
ELECT VAPORTRODE GRVD BAR (ELECTRODE) IMPLANT
ELECTRODE REM PT RTRN 9FT ADLT (ELECTROSURGICAL) ×1 IMPLANT
GLOVE BIO SURGEON STRL SZ 6.5 (GLOVE) ×3 IMPLANT
GLOVE BIOGEL PI IND STRL 7.0 (GLOVE) ×1 IMPLANT
GLOVE BIOGEL PI INDICATOR 7.0 (GLOVE)
GOWN STRL REUS W/ TWL LRG LVL3 (GOWN DISPOSABLE) ×1 IMPLANT
GOWN STRL REUS W/TWL LRG LVL3 (GOWN DISPOSABLE) ×2
KIT ROOM TURNOVER WOR (KITS) ×2 IMPLANT
LEGGING LITHOTOMY PAIR STRL (DRAPES) ×2 IMPLANT
LOOP ANGLED CUTTING 22FR (CUTTING LOOP) IMPLANT
PACK BASIN DAY SURGERY FS (CUSTOM PROCEDURE TRAY) ×2 IMPLANT
PAD DRESSING TELFA 3X8 NADH (GAUZE/BANDAGES/DRESSINGS) ×1 IMPLANT
PAD OB MATERNITY 4.3X12.25 (PERSONAL CARE ITEMS) ×2 IMPLANT
PAD PREP 24X48 CUFFED NSTRL (MISCELLANEOUS) ×2 IMPLANT
SEAL ROD LENS SCOPE MYOSURE (ABLATOR) ×2 IMPLANT
SYR 20CC LL (SYRINGE) IMPLANT
TOWEL OR 17X24 6PK STRL BLUE (TOWEL DISPOSABLE) ×4 IMPLANT
TRAY DSU PREP LF (CUSTOM PROCEDURE TRAY) ×2 IMPLANT
TUBE CONNECTING 12X1/4 (SUCTIONS) ×1 IMPLANT
TUBING AQUILEX INFLOW (TUBING) ×2 IMPLANT
TUBING AQUILEX OUTFLOW (TUBING) ×2 IMPLANT
WATER STERILE IRR 500ML POUR (IV SOLUTION) ×2 IMPLANT

## 2016-04-29 NOTE — Op Note (Signed)
Preoperative Diagnosis: Postmenopausal bleeding, endometrial polyps, history of breast cancer  Postoperative Diagnosis: same  Procedure: Hysteroscopy, polypectomy, dilation and curettage  Surgeon: Dr Sumner Boast  Assistants: None  Anesthesia: General via LMA  EBL: 5 cc   Fluids: 600 cc  Fluid deficit: 200 cc  Urine output: not recorded  Indications for surgery: The patient is a 45 yo female with a h/o breast cancer, who presented with postmenopausal bleeding. Work up included a benign endometrial biopsy and large endometrial polyps on sonohysterogram The risks of the surgery were reviewed with the patient and the consent form was signed prior to her surgery.  Findings: EUA: retroverted normal sized uterus, no adnexal masses. Hysteroscopy: endometrial cavity filled with polyps. Post polypectomy she had a thin appearing endometrium with normal tubal ostia bilaterally  Specimens: endometrial polyps, endocervical curettage, endometrial curettage   Procedure: The patient was taken to the operating room with an IV in place. She was placed in the dorsal lithotomy position and anesthesia was administered. She was prepped and draped in the usual sterile fashion for a vaginal procedure. She was I&O catheterizexd. A speculum was placed in the vagina and a single tooth tenaculum was placed on the anterior lip of the cervix. The cervix was dilated to a #19 pratt dilator. The uterus was sounded to 7 cm. The myosure hysteroscope was inserted into the uterine cavity. With continuous infusion of normal saline, the uterine cavity was visualized with the above findings. The endometrial polyps were removed with the [facility] reach device. The myosure was then removed. An endocervical curette was obtained. The cavity was then curetted with the small sharp curette. The cavity had the characteristically gritty texture at the end of the procedure. The curette and the single tooth tenaculum were removed. Slight  oozing from the tenaculum site was stopped with pressure. The speculum was removed. The patients perineum was cleansed of betadine and she was taken out of the dorsal lithotomy position.  Upon awakening the LMA was removed and the patient was transferred to the recovery room in stable and awake condition.  The sponge and instrument count were correct. There were no complications.

## 2016-04-29 NOTE — Anesthesia Postprocedure Evaluation (Signed)
Anesthesia Post Note  Patient: Jenna Gonzales  Procedure(s) Performed: Procedure(s) (LRB): DILATATION & CURETTAGE/HYSTEROSCOPY WITH MYOSURE (N/A)  Patient location during evaluation: PACU Anesthesia Type: General Level of consciousness: awake and alert Pain management: pain level controlled Vital Signs Assessment: post-procedure vital signs reviewed and stable Respiratory status: spontaneous breathing, nonlabored ventilation, respiratory function stable and patient connected to nasal cannula oxygen Cardiovascular status: blood pressure returned to baseline and stable Postop Assessment: no signs of nausea or vomiting Anesthetic complications: no    Last Vitals:  Filed Vitals:   04/29/16 0703 04/29/16 0855  BP: 132/96 120/90  Pulse: 117 112  Temp: 37.3 C 37.1 C  Resp: 16 20    Last Pain:  Filed Vitals:   04/29/16 0859  PainSc: Asleep                 Denson Niccoli JENNETTE

## 2016-04-29 NOTE — Interval H&P Note (Signed)
History and Physical Interval Note:  04/29/2016 8:05 AM  Jenna Gonzales  has presented today for surgery, with the diagnosis of AUB, endometrial polyps, hx of breast cancer  The various methods of treatment have been discussed with the patient and family. After consideration of risks, benefits and other options for treatment, the patient has consented to  Procedure(s) with comments: [place] (N/A) - multiple endometrial polyps as a surgical intervention .  The patient's history has been reviewed, patient examined, no change in status, stable for surgery.  I have reviewed the patient's chart and labs.  Questions were answered to the patient's satisfaction.     [name]

## 2016-04-29 NOTE — Discharge Instructions (Signed)

## 2016-04-29 NOTE — Transfer of Care (Signed)
Immediate Anesthesia Transfer of Care Note  Patient: Jenna Gonzales  Procedure(s) Performed: Procedure(s) (LRB): DILATATION & CURETTAGE/HYSTEROSCOPY WITH MYOSURE (N/A)  Patient Location: PACU  Anesthesia Type: General  Level of Consciousness: awake, oriented, sedated and patient cooperative  Airway & Oxygen Therapy: Patient Spontanous Breathing and Patient connected to face mask oxygen  Post-op Assessment: Report given to PACU RN and Post -op Vital signs reviewed and stable  Post vital signs: Reviewed and stable  Complications: No apparent anesthesia complications Last Vitals:  Filed Vitals:   04/29/16 0703 04/29/16 0855  BP: 132/96 120/90  Pulse: 117 112  Temp: 37.3 C 37.1 C  Resp: 16 20

## 2016-04-29 NOTE — Anesthesia Preprocedure Evaluation (Signed)
Anesthesia Evaluation  Patient identified by MRN, date of birth, ID band Patient awake    Reviewed: Allergy & Precautions, NPO status , Patient's Chart, lab work & pertinent test results  History of Anesthesia Complications Negative for: history of anesthetic complications  Airway Mallampati: II  TM Distance: >3 FB Neck ROM: Full    Dental no notable dental hx. (+) Dental Advisory Given   Pulmonary neg pulmonary ROS,    Pulmonary exam normal breath sounds clear to auscultation       Cardiovascular hypertension, Normal cardiovascular exam Rhythm:Regular Rate:Normal     Neuro/Psych PSYCHIATRIC DISORDERS Anxiety negative neurological ROS     GI/Hepatic Neg liver ROS, GERD  ,  Endo/Other  negative endocrine ROS  Renal/GU negative Renal ROS  negative genitourinary   Musculoskeletal negative musculoskeletal ROS (+)   Abdominal   Peds negative pediatric ROS (+)  Hematology negative hematology ROS (+)   Anesthesia Other Findings   Reproductive/Obstetrics negative OB ROS                             Anesthesia Physical Anesthesia Plan  ASA: II  Anesthesia Plan: General   Post-op Pain Management:    Induction: Intravenous  Airway Management Planned: LMA  Additional Equipment:   Intra-op Plan:   Post-operative Plan: Extubation in OR  Informed Consent: I have reviewed the patients History and Physical, chart, labs and discussed the procedure including the risks, benefits and alternatives for the proposed anesthesia with the patient or authorized representative who has indicated his/her understanding and acceptance.   Dental advisory given  Plan Discussed with: CRNA  Anesthesia Plan Comments:         Anesthesia Quick Evaluation

## 2016-04-29 NOTE — Anesthesia Procedure Notes (Signed)
Procedure Name: LMA Insertion Date/Time: 04/29/2016 8:14 AM Performed by: Denna Haggard D Pre-anesthesia Checklist: Patient identified, Timeout performed, Emergency Drugs available, Suction available and Patient being monitored Patient Re-evaluated:Patient Re-evaluated prior to inductionOxygen Delivery Method: Circle system utilized Preoxygenation: Pre-oxygenation with 100% oxygen Intubation Type: IV induction Ventilation: Mask ventilation without difficulty LMA Size: 4.0 Number of attempts: 1 Placement Confirmation: positive ETCO2 and breath sounds checked- equal and bilateral Tube secured with: Tape Dental Injury: Teeth and Oropharynx as per pre-operative assessment

## 2016-04-30 ENCOUNTER — Encounter (HOSPITAL_BASED_OUTPATIENT_CLINIC_OR_DEPARTMENT_OTHER): Payer: Self-pay | Admitting: Obstetrics and Gynecology

## 2016-04-30 ENCOUNTER — Telehealth: Payer: Self-pay | Admitting: Obstetrics and Gynecology

## 2016-04-30 ENCOUNTER — Encounter: Payer: Self-pay | Admitting: Obstetrics and Gynecology

## 2016-04-30 NOTE — Telephone Encounter (Signed)
Patient is calling to find out when she will be getting a note to go back to work after surgery.

## 2016-04-30 NOTE — Telephone Encounter (Signed)
Routing to Dr. Talbert Nan. Please advise when patient will be able to return to work.  Dilatation & Curettage, Hysteroscopy, polypectomy 04/29/16.

## 2016-05-01 ENCOUNTER — Telehealth: Payer: Self-pay | Admitting: *Deleted

## 2016-05-01 NOTE — Telephone Encounter (Signed)
Per Dr. Talbert Nan, patient has been sent letter after speaking on the phone with Dr. Talbert Nan.

## 2016-05-01 NOTE — Telephone Encounter (Signed)
Patient came by the office to drop off paper work. She asked that I check her temp. Which was 99.5. Her pulse was 104 which is normal for her. I informed Dr. Talbert Nan who said that was fine. Patient is not having any pain from her surgery Monday.

## 2016-05-02 ENCOUNTER — Encounter: Payer: Self-pay | Admitting: Family Medicine

## 2016-05-02 ENCOUNTER — Ambulatory Visit (INDEPENDENT_AMBULATORY_CARE_PROVIDER_SITE_OTHER): Payer: Commercial Managed Care - PPO | Admitting: Family Medicine

## 2016-05-02 ENCOUNTER — Encounter: Payer: Self-pay | Admitting: *Deleted

## 2016-05-02 VITALS — BP 122/84 | HR 117 | Temp 98.3°F | Ht 68.5 in | Wt 164.9 lb

## 2016-05-02 DIAGNOSIS — R9431 Abnormal electrocardiogram [ECG] [EKG]: Secondary | ICD-10-CM

## 2016-05-02 DIAGNOSIS — J069 Acute upper respiratory infection, unspecified: Secondary | ICD-10-CM | POA: Diagnosis not present

## 2016-05-02 DIAGNOSIS — R000 Tachycardia, unspecified: Secondary | ICD-10-CM | POA: Diagnosis not present

## 2016-05-02 MED ORDER — BENZONATATE 100 MG PO CAPS: 100.0000 mg | ORAL_CAPSULE | Freq: Three times a day (TID) | ORAL | Status: DC | PRN

## 2016-05-02 MED FILL — BENZONATATE 100 MG CAPSULE: 100 | 6 days supply | Qty: 20 | Fill #0

## 2016-05-02 NOTE — Progress Notes (Signed)
Pre visit review using our clinic review tool, if applicable. No additional management support is needed unless otherwise documented below in the visit note. 

## 2016-05-02 NOTE — Progress Notes (Signed)
HPI:  Jenna Gonzales is a pleasant 45 year old with a past medical history of breast cancer, anxiety, depression, DVT, pulmonary embolism, allergic rhinitis, borderline hypertension and abnormal uterine bleeding, not seen here in several years, here for an acute visit for "sinus infection".  Sinus congestion: -started:  3 days ago -symptoms:clear nasal congestion, sore throat, postnasal drip, sinus pressure,cough -denies:fever, SOB, NVD, tooth pain, sinus pain -has tried: her regular allergy medications -sick contacts/travel/risks: no reported flu, strep or tick exposure -Hx of: allergies  Abnormal EKG: -EKG preop relatively recently with her gynecologist -Reports she was told that it had some abnormalities and was told to follow-up with her PCP -Denies: chest pain, shortness of breath, dyspnea on exertion and exertional chest pain, swelling, syncope or presyncope - She does admit to palpitations at times, usually induced by worry or anxiety and admits that her heart rate is always up at doctor's appointments, but that she thinks this is related to anxiety -on ROC hx abnormal EKG remotely in 2014 with similar findings to EKG with gyn a few days ago, abnormal waveforms in II, AvF and sinus tach -on goserelin for breast CA  ROS: See pertinent positives and negatives per HPI.  Past Medical History  Diagnosis Date  . History of breast cancer oncologist-  dr Ron Agee-- no recurrence    dx 11/ 2010  s/p  right lumpectomy w/ snl dissection 11-30-2009/  Stage 2B, DCIS, grade 1, (T2 N1),  ER/PR+,  HER2 negative/  chemo completed May 2011 and radiation completed July 2011 and completed anastrozole July 2016  . Anxiety   . History of DVT of lower extremity     09-22-2013  right lower extremitiy (femoral, popliteal, posterior tibial)  . History of pulmonary embolus (PE)     09-22-2013  . Osteopenia   . Allergic rhinitis   . Endometrial polyp   . Abnormal uterine bleeding (AUB)   .  History of radiation therapy     right breast -- complete 07/ 2011  . History of cancer chemotherapy     right breast-- complete 04/2010  . Borderline hypertension   . Wears contact lenses     Past Surgical History  Procedure Laterality Date  . Breast lumpectomy with axillary lymph node dissection Right 11-30-2009  . Port-a-cath placement /  removal  01-10-2010/  removal 05-09-2010  . Dilatation & curettage/hysteroscopy with myosure N/A 04/29/2016    Procedure: DILATATION & CURETTAGE/POLYPECTOMY/HYSTEROSCOPY WITH MYOSURE;  Surgeon: [name], MD;  Location: [facility];  Service: Gynecology;  Laterality: N/A;  multiple endometrial polyps    Family History  Problem Relation Age of Onset  . Leukemia Father   . Arthritis    . Heart disease    . Hypertension    . Kidney disease    . Diabetes    . Diabetes Other     Social History   Social History  . Marital Status: Single    Spouse Name: N/A  . Number of Children: N/A  . Years of Education: N/A   Social History Main Topics  . Smoking status: Never Smoker   . Smokeless tobacco: Never Used  . Alcohol Use: 0.0 oz/week    0 Standard drinks or equivalent per week     Comment: socially  . Drug Use: No  . Sexual Activity: No   Other Topics Concern  . None   Social History Narrative   Marital status:  Single      Work or School:  [employer]      Home Situation: lives alone      Spiritual Beliefs: none      Lifestyle: going to start exercise - diet is ok              Current outpatient prescriptions:  .  goserelin (ZOLADEX) 10.8 MG injection, Inject 10.8 mg into the skin every 3 (three) months., Disp: , Rfl:  .  loratadine (CLARITIN) 10 MG tablet, Take 10 mg by mouth daily., Disp: , Rfl:  .  benzonatate (TESSALON PERLES) 100 MG capsule, Take 1 capsule (100 mg total) by mouth 3 (three) times daily as needed., Disp: 20 capsule, Rfl: 0 No current facility-administered medications for  this visit.  Facility-Administered Medications Ordered in Other Visits:  .  goserelin (ZOLADEX) injection 10.8 mg, 10.8 mg, Subcutaneous, Q90 days, Laurie Panda, NP, 10.8 mg at 10/25/15 1625  EXAM:  Filed Vitals:   05/02/16 1510  BP: 122/84  Pulse: 117  Temp: 98.3 F (36.8 C)    Body mass index is 24.71 kg/(m^2).  GENERAL: vitals reviewed and listed above, alert, oriented, appears well hydrated and in no acute distress  HEENT: atraumatic, conjunttiva clear, no obvious abnormalities on inspection of external nose and ears, normal appearance of ear canals and TMs, clear nasal congestion, mild post oropharyngeal erythema with PND, no tonsillar edema or exudate, no sinus TTP  NECK: no obvious masses on inspection  LUNGS: clear to auscultation bilaterally, no wheezes, rales or rhonchi, good air movement  CV: reg rhythm, tachy, no c/r/m, no peripheral edema  MS: moves all extremities without noticeable abnormality  PSYCH: pleasant and cooperative, no obvious depression or anxiety  ASSESSMENT AND PLAN:  Discussed the following assessment and plan:  Viral upper respiratory illness -given HPI and exam findings today, a serious infection or illness is unlikely. We discussed potential etiologies, with VURI being most likely, and advised supportive care and monitoring. We discussed treatment side effects, likely course, antibiotic misuse, transmission, and signs of developing a serious illness.  Sinus tachycardia ([place]) - Plan: Ambulatory referral to Cardiology Nonspecific abnormal electrocardiogram (ECG) (EKG) - Plan: Ambulatory referral to Cardiology -we discussed possible serious and likely etiologies, workup and treatment, treatment risks and return precautions -after this discussion, Jenna Gonzales opted for evaluation with cardiology, referral placed - maybe need to discuss treatment of anxiety further -of course, we advised Jenna Gonzales  to return or notify a doctor immediately if  symptoms worsen or persist or new concerns arise.   -of course, we advised to return or notify a doctor immediately if symptoms worsen or persist or new concerns arise.    Patient Instructions   Before you leave: -Schedule a regular follow-up visit in about 2-3 months  For the fast are pain and the abnormal EKG, we placed a referral for you as discussed. It usually takes about 1-2 weeks to process and schedule this referral. If you have not heard from [place] regarding this appointment in 2 weeks please contact our office.  We may need to consider treatment for anxiety and can discuss this further at your follow-up visit.  INSTRUCTIONS FOR UPPER RESPIRATORY INFECTION:  -nasal saline wash 2-3 times daily (use prepackaged nasal saline or bottled/distilled water if making your own)   -AFRIN nasal spray twice daily for 4 days. Do not use longer the 4 days. This is available over-the-counter.  - Continue your regular allergy medications  -can use tylenol (in no history of liver disease) or ibuprofen (if no history  of kidney disease, bowel bleeding or significant heart disease) as directed for aches and sorethroat  -in the winter time, using a humidifier at night is helpful (please follow cleaning instructions)  -if you are taking a cough medication - use only as directed, may also try a teaspoon of honey to coat the throat and throat lozenges. We spent Tessalon to your pharmacy to try for the cough.  -for sore throat, salt water gargles can help  -follow up if you have fevers, facial pain, tooth pain, difficulty breathing or are worsening or symptoms persist longer then expected  Upper Respiratory Infection, Adult An upper respiratory infection (URI) is also known as the common cold. It is often caused by a type of germ (virus). Colds are easily spread (contagious). You can pass it to others by kissing, coughing, sneezing, or drinking out of the same glass. Usually, you get better in 1 to 3   weeks.  However, the cough can last for even longer. HOME CARE   Only take medicine as told by your doctor. Follow instructions provided above.  Drink enough water and fluids to keep your pee (urine) clear or pale yellow.  Get plenty of rest.  Return to work when your temperature is < 100 for 24 hours or as told by your doctor. You may use a face mask and wash your hands to stop your cold from spreading. GET HELP RIGHT AWAY IF:   After the first few days, you feel you are getting worse.  You have questions about your medicine.  You have chills, shortness of breath, or red spit (mucus).  You have pain in the face for more then 1-2 days, especially when you bend forward.  You have a fever, puffy (swollen) neck, pain when you swallow, or white spots in the back of your throat.  You have a bad headache, ear pain, sinus pain, or chest pain.  You have a high-pitched whistling sound when you breathe in and out (wheezing).  You cough up blood.  You have sore muscles or a stiff neck. MAKE SURE YOU:   Understand these instructions.  Will watch your condition.  Will get help right away if you are not doing well or get worse. Document Released: 05/27/2008 Document Revised: 03/02/2012 Document Reviewed: 03/16/2014 [facility] Patient Information 2015 [place], [place]. This information is not intended to replace advice given to you by your health care provider. Make sure you discuss any questions you have with your health care provider.       Colin Benton R.

## 2016-05-02 NOTE — Patient Instructions (Signed)
Before you leave: -Schedule a regular follow-up visit in about 2-3 months  For the fast are pain and the abnormal EKG, we placed a referral for you as discussed. It usually takes about 1-2 weeks to process and schedule this referral. If you have not heard from [place] regarding this appointment in 2 weeks please contact our office.  We may need to consider treatment for anxiety and can discuss this further at your follow-up visit.  INSTRUCTIONS FOR UPPER RESPIRATORY INFECTION:  -nasal saline wash 2-3 times daily (use prepackaged nasal saline or bottled/distilled water if making your own)   -AFRIN nasal spray twice daily for 4 days. Do not use longer the 4 days. This is available over-the-counter.  - Continue your regular allergy medications  -can use tylenol (in no history of liver disease) or ibuprofen (if no history of kidney disease, bowel bleeding or significant heart disease) as directed for aches and sorethroat  -in the winter time, using a humidifier at night is helpful (please follow cleaning instructions)  -if you are taking a cough medication - use only as directed, may also try a teaspoon of honey to coat the throat and throat lozenges. We spent Tessalon to your pharmacy to try for the cough.  -for sore throat, salt water gargles can help  -follow up if you have fevers, facial pain, tooth pain, difficulty breathing or are worsening or symptoms persist longer then expected  Upper Respiratory Infection, Adult An upper respiratory infection (URI) is also known as the common cold. It is often caused by a type of germ (virus). Colds are easily spread (contagious). You can pass it to others by kissing, coughing, sneezing, or drinking out of the same glass. Usually, you get better in 1 to 3  weeks.  However, the cough can last for even longer. HOME CARE   Only take medicine as told by your doctor. Follow instructions provided above.  Drink enough water and fluids to keep your pee (urine)  clear or pale yellow.  Get plenty of rest.  Return to work when your temperature is < 100 for 24 hours or as told by your doctor. You may use a face mask and wash your hands to stop your cold from spreading. GET HELP RIGHT AWAY IF:   After the first few days, you feel you are getting worse.  You have questions about your medicine.  You have chills, shortness of breath, or red spit (mucus).  You have pain in the face for more then 1-2 days, especially when you bend forward.  You have a fever, puffy (swollen) neck, pain when you swallow, or white spots in the back of your throat.  You have a bad headache, ear pain, sinus pain, or chest pain.  You have a high-pitched whistling sound when you breathe in and out (wheezing).  You cough up blood.  You have sore muscles or a stiff neck. MAKE SURE YOU:   Understand these instructions.  Will watch your condition.  Will get help right away if you are not doing well or get worse. Document Released: 05/27/2008 Document Revised: 03/02/2012 Document Reviewed: 03/16/2014 [facility] Patient Information 2015 [place], [place]. This information is not intended to replace advice given to you by your health care provider. Make sure you discuss any questions you have with your health care provider.

## 2016-05-03 ENCOUNTER — Encounter: Payer: Self-pay | Admitting: Cardiovascular Disease

## 2016-05-06 ENCOUNTER — Other Ambulatory Visit: Payer: Self-pay

## 2016-05-06 ENCOUNTER — Emergency Department (HOSPITAL_COMMUNITY)
Admission: EM | Admit: 2016-05-06 | Discharge: 2016-05-06 | Disposition: A | Discharge status: Home / Self Care | Payer: Commercial Managed Care - PPO | Attending: Emergency Medicine | Admitting: Emergency Medicine

## 2016-05-06 ENCOUNTER — Emergency Department (HOSPITAL_COMMUNITY): Payer: Commercial Managed Care - PPO

## 2016-05-06 ENCOUNTER — Emergency Department (HOSPITAL_BASED_OUTPATIENT_CLINIC_OR_DEPARTMENT_OTHER)
Admit: 2016-05-06 | Discharge: 2016-05-06 | Disposition: A | Discharge status: Home / Self Care | Payer: Commercial Managed Care - PPO | Attending: Emergency Medicine | Admitting: Emergency Medicine

## 2016-05-06 ENCOUNTER — Encounter (HOSPITAL_COMMUNITY): Payer: Self-pay

## 2016-05-06 ENCOUNTER — Inpatient Hospital Stay (HOSPITAL_COMMUNITY): Admit: 2016-05-06 | Payer: Commercial Managed Care - PPO

## 2016-05-06 DIAGNOSIS — Z9221 Personal history of antineoplastic chemotherapy: Secondary | ICD-10-CM | POA: Insufficient documentation

## 2016-05-06 DIAGNOSIS — Z79899 Other long term (current) drug therapy: Secondary | ICD-10-CM | POA: Diagnosis not present

## 2016-05-06 DIAGNOSIS — M79609 Pain in unspecified limb: Secondary | ICD-10-CM

## 2016-05-06 DIAGNOSIS — I824Z1 Acute embolism and thrombosis of unspecified deep veins of right distal lower extremity: Secondary | ICD-10-CM | POA: Insufficient documentation

## 2016-05-06 DIAGNOSIS — Z8742 Personal history of other diseases of the female genital tract: Secondary | ICD-10-CM | POA: Insufficient documentation

## 2016-05-06 DIAGNOSIS — R2241 Localized swelling, mass and lump, right lower limb: Secondary | ICD-10-CM | POA: Diagnosis present

## 2016-05-06 DIAGNOSIS — Z86711 Personal history of pulmonary embolism: Secondary | ICD-10-CM | POA: Diagnosis not present

## 2016-05-06 DIAGNOSIS — R0982 Postnasal drip: Secondary | ICD-10-CM | POA: Diagnosis not present

## 2016-05-06 DIAGNOSIS — Z8739 Personal history of other diseases of the musculoskeletal system and connective tissue: Secondary | ICD-10-CM | POA: Diagnosis not present

## 2016-05-06 DIAGNOSIS — Z923 Personal history of irradiation: Secondary | ICD-10-CM | POA: Insufficient documentation

## 2016-05-06 DIAGNOSIS — Z853 Personal history of malignant neoplasm of breast: Secondary | ICD-10-CM | POA: Insufficient documentation

## 2016-05-06 DIAGNOSIS — I82401 Acute embolism and thrombosis of unspecified deep veins of right lower extremity: Secondary | ICD-10-CM

## 2016-05-06 DIAGNOSIS — J3489 Other specified disorders of nose and nasal sinuses: Secondary | ICD-10-CM | POA: Diagnosis not present

## 2016-05-06 DIAGNOSIS — Z8659 Personal history of other mental and behavioral disorders: Secondary | ICD-10-CM | POA: Insufficient documentation

## 2016-05-06 LAB — I-STAT CHEM 8, ED
BUN: 7 mg/dL (ref 6–20)
CREATININE: 0.7 mg/dL (ref 0.44–1.00)
Calcium, Ion: 1.15 mmol/L (ref 1.12–1.23)
Chloride: 105 mmol/L (ref 101–111)
GLUCOSE: 111 mg/dL — AB (ref 65–99)
HEMATOCRIT: 51 % — AB (ref 36.0–46.0)
Hemoglobin: 17.3 g/dL — ABNORMAL HIGH (ref 12.0–15.0)
POTASSIUM: 3.4 mmol/L — AB (ref 3.5–5.1)
Sodium: 144 mmol/L (ref 135–145)
TCO2: 24 mmol/L (ref 0–100)

## 2016-05-06 MED ORDER — IOPAMIDOL (ISOVUE-370) INJECTION 76%
INTRAVENOUS | Status: AC
  Administered 2016-05-06: 80 mL
  Filled 2016-05-06: qty 100

## 2016-05-06 MED ORDER — RIVAROXABAN 20 MG PO TABS: 20.0000 mg | ORAL_TABLET | Freq: Every day | ORAL | Status: DC

## 2016-05-06 MED ORDER — RIVAROXABAN (XARELTO) EDUCATION KIT FOR DVT/PE PATIENTS
PACK | Freq: Once | Status: DC
  Filled 2016-05-06: qty 1

## 2016-05-06 MED ORDER — RIVAROXABAN (XARELTO) VTE STARTER PACK (15 & 20 MG): 15.0000 mg | ORAL_TABLET | ORAL | Status: DC

## 2016-05-06 MED ORDER — RIVAROXABAN 15 MG PO TABS: 15.0000 mg | ORAL_TABLET | Freq: Two times a day (BID) | ORAL | Status: DC

## 2016-05-06 MED ORDER — RIVAROXABAN 15 MG PO TABS
15.0000 mg | ORAL_TABLET | Freq: Two times a day (BID) | ORAL | Status: DC
  Administered 2016-05-06: 15 mg via ORAL
  Filled 2016-05-06: qty 1

## 2016-05-06 NOTE — ED Provider Notes (Addendum)
CSN: [id number]     Arrival date & time 05/06/16  1131 History   First MD Initiated Contact with Patient 05/06/16 1203     Chief Complaint  Patient presents with  . Claudication     (Consider location/radiation/quality/duration/timing/severity/associated sxs/prior Treatment) HPI Comments: Patient presents with right calf pain. She states she has a history of DVT/PE. She's not currently on anticoagulants. The last 2 weeks she's had some intermittent cramping of her right calf. It's more when she's walking. She's had some intermittent swelling of the right leg. She did have recent surgery to remove a uterine polyp. She's concerned with the pain and intermittent swelling that she might have another blood clot in leg. All she's here she is noted to be mildly tachycardic. She states she has recently been diagnosed with an abnormal EKG that showed sinus tachycardia. She's been referred to cardiology but has not yet had an appointment. She denies any chest pain or shortness of breath. She denies any fevers. She had some recent sinus congestion and has a resulting cough but she states her symptoms are improving.   Past Medical History  Diagnosis Date  . History of breast cancer oncologist-  dr Ron Agee-- no recurrence    dx 11/ 2010  s/p  right lumpectomy w/ snl dissection 11-30-2009/  Stage 2B, DCIS, grade 1, (T2 N1),  ER/PR+,  HER2 negative/  chemo completed May 2011 and radiation completed July 2011 and completed anastrozole July 2016  . Anxiety   . History of DVT of lower extremity     09-22-2013  right lower extremitiy (femoral, popliteal, posterior tibial)  . History of pulmonary embolus (PE)     09-22-2013  . Osteopenia   . Allergic rhinitis   . Endometrial polyp   . Abnormal uterine bleeding (AUB)   . History of radiation therapy     right breast -- complete 07/ 2011  . History of cancer chemotherapy     right breast-- complete 04/2010  . Borderline hypertension   . Wears contact  lenses    Past Surgical History  Procedure Laterality Date  . Breast lumpectomy with axillary lymph node dissection Right 11-30-2009  . Port-a-cath placement /  removal  01-10-2010/  removal 05-09-2010  . Dilatation & curettage/hysteroscopy with myosure N/A 04/29/2016    Procedure: DILATATION & CURETTAGE/POLYPECTOMY/HYSTEROSCOPY WITH MYOSURE;  Surgeon: [name], MD;  Location: [facility]  Service: Gynecology;  Laterality: N/A;  multiple endometrial polyps   Family History  Problem Relation Age of Onset  . Leukemia Father   . Arthritis    . Heart disease    . Hypertension    . Kidney disease    . Diabetes    . Diabetes Other    Social History  Substance Use Topics  . Smoking status: Never Smoker   . Smokeless tobacco: Never Used  . Alcohol Use: 0.0 oz/week    0 Standard drinks or equivalent per week     Comment: socially   OB History    Gravida Para Term Preterm AB TAB SAB Ectopic Multiple Living   '1 1 1       1     ' Review of Systems  Constitutional: Negative for fever, chills, diaphoresis and fatigue.  HENT: Positive for postnasal drip and rhinorrhea. Negative for congestion and sneezing.   Eyes: Negative.   Respiratory: Positive for cough. Negative for chest tightness and shortness of breath.   Cardiovascular: Positive for leg swelling. Negative for chest pain.  Gastrointestinal: Negative for nausea, vomiting, abdominal pain, diarrhea and blood in stool.  Genitourinary: Negative for frequency, hematuria, flank pain and difficulty urinating.  Musculoskeletal: Positive for myalgias. Negative for back pain and arthralgias.  Skin: Negative for rash.  Neurological: Negative for dizziness, speech difficulty, weakness, numbness and headaches.      Allergies  Tamoxifen  Home Medications   Prior to Admission medications   Medication Sig Start Date End Date Taking? Authorizing Provider  benzonatate (TESSALON PERLES) 100 MG capsule Take 1 capsule  (100 mg total) by mouth 3 (three) times daily as needed. 05/02/16  Yes Lucretia Kern, DO  cetirizine (ZYRTEC) 10 MG tablet Take 10 mg by mouth daily.   Yes Historical Provider, MD  goserelin (ZOLADEX) 10.8 MG injection Inject 10.8 mg into the skin every 3 (three) months.   Yes Historical Provider, MD  ibuprofen (ADVIL,MOTRIN) 200 MG tablet Take 600 mg by mouth every 6 (six) hours as needed (pain).   Yes Historical Provider, MD   BP 127/90 mmHg  Pulse 107  Temp(Src) 99 F (37.2 C) (Oral)  Resp 20  Ht '5\' 8"'  (1.727 m)  Wt 165 lb (74.844 kg)  BMI 25.09 kg/m2  SpO2 96% Physical Exam  Constitutional: She is oriented to person, place, and time. She appears well-developed and well-nourished.  HENT:  Head: Normocephalic and atraumatic.  Eyes: Pupils are equal, round, and reactive to light.  Neck: Normal range of motion. Neck supple.  Cardiovascular: Normal rate, regular rhythm and normal heart sounds.   Pulmonary/Chest: Effort normal and breath sounds normal. No respiratory distress. She has no wheezes. She has no rales. She exhibits no tenderness.  Abdominal: Soft. Bowel sounds are normal. There is no tenderness. There is no rebound and no guarding.  Musculoskeletal: Normal range of motion. She exhibits no edema.  Trace edema to the right lower extremity. She has some mild tenderness to the upper calf and behind the right knee. She has normal pedal pulses in the foot. Normal coloration in the foot.  Lymphadenopathy:    She has no cervical adenopathy.  Neurological: She is alert and oriented to person, place, and time.  Skin: Skin is warm and dry. No rash noted.  Psychiatric: She has a normal mood and affect.    ED Course  Procedures (including critical care time) Labs Review Labs Reviewed  I-STAT CHEM 8, ED - Abnormal; Notable for the following:    Potassium 3.4 (*)    Glucose, Bld 111 (*)    Hemoglobin 17.3 (*)    HCT 51.0 (*)    All other components within normal limits    Imaging  Review Ct Angio Chest Pe W/cm &/or Wo Cm  05/06/2016  CLINICAL DATA:  Right-sided femoral and popliteal DVT. Tachycardia. History of prior pulmonary embolism. EXAM: CT ANGIOGRAPHY CHEST WITH CONTRAST TECHNIQUE: Multidetector CT imaging of the chest was performed using the standard protocol during bolus administration of intravenous contrast. Multiplanar CT image reconstructions and MIPs were obtained to evaluate the vascular anatomy. CONTRAST:  80 mL Isovue 370 IV COMPARISON:  09/22/2013 FINDINGS: Mediastinum/Nodes: The pulmonary arteries are very well opacified. There is no evidence of pulmonary embolism. In the region of previous right lower lobe subsegmental pulmonary embolism, no residual thrombus is identified. The heart size is normal. No pericardial effusion. The thoracic aorta is opacified and shows normal caliber without dissection. No mediastinal lymphadenopathy or mass. Lungs/Pleura: There is no evidence of pulmonary edema, consolidation, pneumothorax, nodule or pleural fluid. No hilar lymphadenopathy.  Upper abdomen: The visualized upper abdomen is unremarkable. Musculoskeletal: Bony structures are normal. Review of the MIP images confirms the above findings. IMPRESSION: No evidence of pulmonary embolism or other acute findings in the chest. Electronically Signed   By: Aletta Edouard M.D.   On: 05/06/2016 16:48   I have personally reviewed and evaluated these images and lab results as part of my medical decision-making.   EKG Interpretation None      MDM   Final diagnoses:  Acute deep vein thrombosis (DVT) of right lower extremity, unspecified vein (HCC)    PT with +DVT right leg.  Has had ongoing tachycardia for last couple of weeks.  Will check CT chest to assess for PE.  If neg, can go home on xarelto.  If positive, will need admission.    CT neg, will start xarelto, dispense 21 day pack.  Advised close f/u with PCP.   Malvin Johns, MD 05/06/16 [place],  MD 05/06/16 1655

## 2016-05-06 NOTE — ED Notes (Signed)
Vascular lab aware that pt is ready for study.

## 2016-05-06 NOTE — Progress Notes (Signed)
VASCULAR LAB PRELIMINARY  PRELIMINARY  PRELIMINARY  PRELIMINARY  Bilateral lower extremity venous duplex completed.     Right:  DVT noted in the femoral vein and the popliteal vein. No evidence of superficial thrombosis.  No Baker's cyst.  No evidence of DVT or superificial thrombus and no baker's cyst. in the left leg.  Call patients nurse in the ed with the results.   Kyair Ditommaso, RVT, RDMS 05/06/2016, 1:40 PM

## 2016-05-06 NOTE — ED Notes (Signed)
Patient request worknote for longer time MD advised 5 days .

## 2016-05-06 NOTE — Progress Notes (Signed)
ANTICOAGULATION CONSULT NOTE - Initial Consult  Pharmacy Consult for Xarelto Indication: DVT  Allergies  Allergen Reactions  . Tamoxifen Other (See Comments)    Blood clots    Patient Measurements: Height: _0  (172.7 cm) Weight: 165 lb (74.844 kg) IBW/kg (Calculated) : 63.9 Heparin Dosing Weight:   Vital Signs: Temp: 99 F (37.2 C) (05/15 1142) Temp Source: Oral (05/15 1142) BP: 127/90 mmHg (05/15 1600) Pulse Rate: 107 (05/15 1600)  Labs:  Recent Labs  05/06/16 1320  HGB 17.3*  HCT 51.0*  CREATININE 0.70    Estimated Creatinine Clearance: 87.7 mL/min (by C-G formula based on Cr of 0.7).   Medical History: Past Medical History  Diagnosis Date  . History of breast cancer oncologist-  dr Ron Agee-- no recurrence    dx 11/ 2010  s/p  right lumpectomy w/ snl dissection 11-30-2009/  Stage 2B, DCIS, grade 1, (T2 N1),  ER/PR+,  HER2 negative/  chemo completed May 2011 and radiation completed July 2011 and completed anastrozole July 2016  . Anxiety   . History of DVT of lower extremity     09-22-2013  right lower extremitiy (femoral, popliteal, posterior tibial)  . History of pulmonary embolus (PE)     09-22-2013  . Osteopenia   . Allergic rhinitis   . Endometrial polyp   . Abnormal uterine bleeding (AUB)   . History of radiation therapy     right breast -- complete 07/ 2011  . History of cancer chemotherapy     right breast-- complete 04/2010  . Borderline hypertension   . Wears contact lenses     Medications:   (Not in a hospital admission) Scheduled:  . Rivaroxaban  15 mg Oral BID WC   Infusions:    Assessment: 45yo female with history of VTE presents with R calf cramping. Pharmacy is consulted to dose xarelto for DVT. Hgb 17.3, sCr 0.7.  Goal of Therapy:  Monitor platelets by anticoagulation protocol: Yes   Plan:  Xarelto 45m BID for 21 days followed by 243mdaily Monitor s/sx of bleeding Educate pt on xarelto  Aleaya Latona M. BaDiona FoleyPharmD,  BCEmpirelinical Pharmacist Pager [phone]/15/2017,5:00 PM

## 2016-05-06 NOTE — ED Notes (Signed)
Pt c/o R calf pain and intermittent claudication x 1-2 weeks. Pt states she has a hx of DVT and PEs. Pt denies any history of long periods of sitting.

## 2016-05-06 NOTE — Discharge Instructions (Signed)
Deep Vein Thrombosis °A deep vein thrombosis (DVT) is a blood clot (thrombus) that usually occurs in a deep, larger vein of the lower leg or the pelvis, or in an upper extremity such as the arm. These are dangerous and can lead to serious and even life-threatening complications if the clot travels to the lungs. °A DVT can damage the valves in your leg veins so that instead of flowing upward, the blood pools in the lower leg. This is called post-thrombotic syndrome, and it can result in pain, swelling, discoloration, and sores on the leg. °CAUSES °A DVT is caused by the formation of a blood clot in your leg, pelvis, or arm. Usually, several things contribute to the formation of blood clots. A clot may develop when: °· Your blood flow slows down. °· Your vein becomes damaged in some way. °· You have a condition that makes your blood clot more easily. °RISK FACTORS °A DVT is more likely to develop in: °· People who are older, especially over 60 years of age. °· People who are overweight (obese). °· People who sit or lie still for a long time, such as during long-distance travel (over 4 hours), bed rest, hospitalization, or during recovery from certain medical conditions like a stroke. °· People who do not engage in much physical activity (sedentary lifestyle). °· People who have chronic breathing disorders. °· People who have a personal or family history of blood clots or blood clotting disease. °· People who have peripheral vascular disease (PVD), diabetes, or some types of cancer. °· People who have heart disease, especially if the person had a recent heart attack or has congestive heart failure. °· People who have neurological diseases that affect the legs (leg paresis). °· People who have had a traumatic injury, such as breaking a hip or leg. °· People who have recently had major or lengthy surgery, especially on the hip, knee, or abdomen. °· People who have had a central line placed inside a large vein. °· People  who take medicines that contain the hormone estrogen. These include birth control pills and hormone replacement therapy. °· Pregnancy or during childbirth or the postpartum period. °· Long plane flights (over 8 hours). °SIGNS AND SYMPTOMS °Symptoms of a DVT can include:  °· Swelling of your leg or arm, especially if one side is much worse. °· Warmth and redness of your leg or arm, especially if one side is much worse. °· Pain in your arm or leg. If the clot is in your leg, symptoms may be more noticeable or worse when you stand or walk. °· A feeling of pins and needles, if the clot is in the arm. °The symptoms of a DVT that has traveled to the lungs (pulmonary embolism, PE) usually start suddenly and include: °· Shortness of breath while active or at rest. °· Coughing or coughing up blood or blood-tinged mucus. °· Chest pain that is often worse with deep breaths. °· Rapid or irregular heartbeat. °· Feeling light-headed or dizzy. °· Fainting. °· Feeling anxious. °· Sweating. °There may also be pain and swelling in a leg if that is where the blood clot started. °These symptoms may represent a serious problem that is an emergency. Do not wait to see if the symptoms will go away. Get medical help right away. Call your local emergency services (911 in the [place] Do not drive yourself to the hospital. °DIAGNOSIS °Your health care provider will take a medical history and perform a physical exam. You may also   have other tests, including: °· Blood tests to assess the clotting properties of your blood. °· Imaging tests, such as CT, ultrasound, MRI, X-ray, and other tests to see if you have clots anywhere in your body. °TREATMENT °After a DVT is identified, it can be treated. The type of treatment that you receive depends on many factors, such as the cause of your DVT, your risk for bleeding or developing more clots, and other medical conditions that you have. Sometimes, a combination of treatments is necessary. Treatment  options may be combined and include: °· Monitoring the blood clot with ultrasound. °· Taking medicines by mouth, such as newer blood thinners (anticoagulants), thrombolytics, or warfarin. °· Taking anticoagulant medicine by injection or through an IV tube. °· Wearing compression stockings or using different types of devices. °· Surgery (rare) to remove the blood clot or to place a filter in your abdomen to stop the blood clot from traveling to your lungs. °Treatments for a DVT are often divided into immediate treatment and long-term treatment (up to 3 months after DVT). You can work with your health care provider to choose the treatment program that is best for you. °HOME CARE INSTRUCTIONS °If you are taking a newer oral anticoagulant: °· Take the medicine every single day at the same time each day. °· Understand what foods and drugs interact with this medicine. °· Understand that there are no regular blood tests required when using this medicine. °· Understand the side effects of this medicine, including excessive bruising or bleeding. Ask your health care provider or pharmacist about other possible side effects. °If you are taking warfarin: °· Understand how to take warfarin and know which foods can affect how warfarin works in your body. °· Understand that it is dangerous to take too much or too little warfarin. Too much warfarin increases the risk of bleeding. Too little warfarin continues to allow the risk for blood clots. °· Follow your PT and INR blood testing schedule. The PT and INR results allow your health care provider to adjust your dose of warfarin. It is very important that you have your PT and INR tested as often as told by your health care provider. °· Avoid major changes in your diet, or tell your health care provider before you change your diet. Arrange a visit with a registered dietitian to answer your questions. Many foods, especially foods that are high in vitamin K, can interfere with warfarin  and affect the PT and INR results. Eat a consistent amount of foods that are high in vitamin K, such as: °¨ Spinach, kale, broccoli, cabbage, collard greens, turnip greens, Brussels sprouts, peas, cauliflower, seaweed, and parsley. °¨ Beef liver and pork liver. °¨ Green tea. °¨ Soybean oil. °· Tell your health care provider about any and all medicines, vitamins, and supplements that you take, including aspirin and other over-the-counter anti-inflammatory medicines. Be especially cautious with aspirin and anti-inflammatory medicines. Do not take those before you ask your health care provider if it is safe to do so. This is important because many medicines can interfere with warfarin and affect the PT and INR results. °· Do not start or stop taking any over-the-counter or prescription medicine unless your health care provider or pharmacist tells you to do so. °If you take warfarin, you will also need to do these things: °· Hold pressure over cuts for longer than usual. °· Tell your dentist and other health care providers that you are taking warfarin before you have any procedures in which   bleeding may occur. °· Avoid alcohol or drink very small amounts. Tell your health care provider if you change your alcohol intake. °· Do not use tobacco products, including cigarettes, chewing tobacco, and e-cigarettes. If you need help quitting, ask your health care provider. °· Avoid contact sports. °General Instructions °· Take over-the-counter and prescription medicines only as told by your health care provider. Anticoagulant medicines can have side effects, including easy bruising and difficulty stopping bleeding. If you are prescribed an anticoagulant, you will also need to do these things: °¨ Hold pressure over cuts for longer than usual. °¨ Tell your dentist and other health care providers that you are taking anticoagulants before you have any procedures in which bleeding may occur. °¨ Avoid contact sports. °· Wear a medical  alert bracelet or carry a medical alert card that says you have had a PE. °· Ask your health care provider how soon you can go back to your normal activities. Stay active to prevent new blood clots from forming. °· Make sure to exercise while traveling or when you have been sitting or standing for a long period of time. It is very important to exercise. Exercise your legs by walking or by tightening and relaxing your leg muscles often. Take frequent walks. °· Wear compression stockings as told by your health care provider to help prevent more blood clots from forming. °· Do not use tobacco products, including cigarettes, chewing tobacco, and e-cigarettes. If you need help quitting, ask your health care provider. °· Keep all follow-up appointments with your health care provider. This is important. °PREVENTION °Take these actions to decrease your risk of developing another DVT: °· Exercise regularly. For at least 30 minutes every day, engage in: °¨ Activity that involves moving your arms and legs. °¨ Activity that encourages good blood flow through your body by increasing your heart rate. °· Exercise your arms and legs every hour during long-distance travel (over 4 hours). Drink plenty of water and avoid drinking alcohol while traveling. °· Avoid sitting or lying in bed for long periods of time without moving your legs. °· Maintain a weight that is appropriate for your height. Ask your health care provider what weight is healthy for you. °· If you are a woman who is over 35 years of age, avoid unnecessary use of medicines that contain estrogen. These include birth control pills. °· Do not smoke, especially if you take estrogen medicines. If you need help quitting, ask your health care provider. °If you are hospitalized, prevention measures may include: °· Early walking after surgery, as soon as your health care provider says that it is safe. °· Receiving anticoagulants to prevent blood clots. If you cannot take  anticoagulants, other options may be available, such as wearing compression stockings or using different types of devices. °SEEK IMMEDIATE MEDICAL CARE IF: °· You have new or increased pain, swelling, or redness in an arm or leg. °· You have numbness or tingling in an arm or leg. °· You have shortness of breath while active or at rest. °· You have chest pain. °· You have a rapid or irregular heartbeat. °· You feel light-headed or dizzy. °· You cough up blood. °· You notice blood in your vomit, bowel movement, or urine. °These symptoms may represent a serious problem that is an emergency. Do not wait to see if the symptoms will go away. Get medical help right away. Call your local emergency services (911 in the [place] Do not drive yourself to the hospital. °  °  This information is not intended to replace advice given to you by your health care provider. Make sure you discuss any questions you have with your health care provider.   Document Released: 12/09/2005 Document Revised: 08/30/2015 Document Reviewed: 04/05/2015 Elsevier Interactive Patient Education 2016 [place] on my medicine - XARELTO (rivaroxaban)  This medication education was reviewed with me or my healthcare representative as part of my discharge preparation.  The pharmacist that spoke with me during my hospital stay was:  Sharilyn Sites Golva, [place] FOR YOU? Xarelto was prescribed to treat blood clots that may have been found in the veins of your legs (deep vein thrombosis) or in your lungs (pulmonary embolism) and to reduce the risk of them occurring again.  What do you need to know about Xarelto? The starting dose is one 15 mg tablet taken TWICE daily with food for the FIRST 21 DAYS then on (enter date)  05/28/2016  the dose is changed to one 20 mg tablet taken ONCE A DAY with your evening meal.  DO NOT stop taking Xarelto without talking to the health care provider who prescribed the  medication.  Refill your prescription for 20 mg tablets before you run out.  After discharge, you should have regular check-up appointments with your healthcare provider that is prescribing your Xarelto.  In the future your dose may need to be changed if your kidney function changes by a significant amount.  What do you do if you miss a dose? If you are taking Xarelto TWICE DAILY and you miss a dose, take it as soon as you remember. You may take two 15 mg tablets (total 30 mg) at the same time then resume your regularly scheduled 15 mg twice daily the next day.  If you are taking Xarelto ONCE DAILY and you miss a dose, take it as soon as you remember on the same day then continue your regularly scheduled once daily regimen the next day. Do not take two doses of Xarelto at the same time.   Important Safety Information Xarelto is a blood thinner medicine that can cause bleeding. You should call your healthcare provider right away if you experience any of the following: ? Bleeding from an injury or your nose that does not stop. ? Unusual colored urine (red or dark brown) or unusual colored stools (red or black). ? Unusual bruising for unknown reasons. ? A serious fall or if you hit your head (even if there is no bleeding).  Some medicines may interact with Xarelto and might increase your risk of bleeding while on Xarelto. To help avoid this, consult your healthcare provider or pharmacist prior to using any new prescription or non-prescription medications, including herbals, vitamins, non-steroidal anti-inflammatory drugs (NSAIDs) and supplements.  This website has more information on Xarelto: [email or web address]

## 2016-05-06 NOTE — ED Notes (Signed)
Patient discharge by brooke RN

## 2016-05-07 MED FILL — XARELTO STARTER PACK: 15 & 20 | 30 days supply | Qty: 51 | Fill #0

## 2016-05-09 ENCOUNTER — Ambulatory Visit: Payer: Commercial Managed Care - PPO | Admitting: Family Medicine

## 2016-05-09 ENCOUNTER — Ambulatory Visit (INDEPENDENT_AMBULATORY_CARE_PROVIDER_SITE_OTHER): Payer: Commercial Managed Care - PPO | Admitting: Family Medicine

## 2016-05-09 ENCOUNTER — Encounter: Payer: Self-pay | Admitting: Family Medicine

## 2016-05-09 VITALS — BP 108/82 | HR 100 | Temp 98.7°F | Ht 68.0 in | Wt 165.9 lb

## 2016-05-09 DIAGNOSIS — F411 Generalized anxiety disorder: Secondary | ICD-10-CM

## 2016-05-09 DIAGNOSIS — J309 Allergic rhinitis, unspecified: Secondary | ICD-10-CM | POA: Diagnosis not present

## 2016-05-09 DIAGNOSIS — I824Y1 Acute embolism and thrombosis of unspecified deep veins of right proximal lower extremity: Secondary | ICD-10-CM

## 2016-05-09 NOTE — Progress Notes (Signed)
HPI:  Jenna Gonzales is a pleasant 45 year old with a past medical history of breast cancer, anxiety, depression, DVT, pulmonary embolism, allergic rhinitis, borderline hypertension and abnormal uterine bleeding here for follow up after a recent ER visit for a DVT. She had surgery a few weeks ago to rove a uterine polyp. She developed R leg swelling and pain last week and was seen in ER and was started on xarelto. Reports she was told prior DVT/PE was from tamoxifen. CT PE study was negative.  She reports the swelling has subsided and her leg and the pain is much improved. She reports she understands the dosing for this are also in his taking the 15 mg twice daily and has a starter pack for one month but then switches to the 20 mg for 5 days. She does need a refill on the 20 mg to continue once done with the starter pack. She denies chest pain or shortness of breath. She continues to have a fast heart rate, and when she checks it seems to be higher. She has an appointment with the cardiologist next week for this. She does admit to be having more anxiety lately. She has a history of depression and anxiety, and reports she usually does okay until she is dealing with extra stress. Recently she is dealing with the loss of her friend who died from breast cancer, loss of her job and now the blood clot. She has generalized worry on most days. Denies depression, thoughts of self-harm, hallucinations, panic attacks or manic symptoms. She continues to have some clear rhinorrhea and a cough. Some sneezing at times. Zyrtec helps, but she wonders if it is okay to take this. She also only took the [employer] one session was not sure if those would interact with her other medications. No fevers or shortness of breath. No sinus pain.  ROS: See pertinent positives and negatives per HPI.  Past Medical History  Diagnosis Date  . 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 Anxiety   . History of DVT of lower extremity     09-22-2013  right lower extremitiy (femoral, popliteal, posterior tibial)  . History of pulmonary embolus (PE)     09-22-2013  . Osteopenia   . Allergic rhinitis   . Endometrial polyp   . Abnormal uterine bleeding (AUB)   . History of radiation therapy     right breast -- complete 07/ 2011  . History of cancer chemotherapy     right breast-- complete 04/2010  . Borderline hypertension   . Wears contact lenses     Past Surgical History  Procedure Laterality Date  . Breast lumpectomy with axillary lymph node dissection Right 11-30-2009  . Port-a-cath placement /  removal  01-10-2010/  removal 05-09-2010  . Dilatation & curettage/hysteroscopy with myosure N/A 04/29/2016    Procedure: DILATATION & CURETTAGE/POLYPECTOMY/HYSTEROSCOPY WITH MYOSURE;  Surgeon: [name], MD;  Location: [facility];  Service: Gynecology;  Laterality: N/A;  multiple endometrial polyps    Family History  Problem Relation Age of Onset  . Leukemia Father   . Arthritis    . Heart disease    . Hypertension    . Kidney disease    .  Diabetes    . Diabetes Other     Social History   Social History  . Marital Status: Single    Spouse Name: N/A  . Number of Children: N/A  . Years of Education: N/A   Social History Main Topics  . Smoking status: Never Smoker   . Smokeless tobacco: Never Used  . Alcohol Use: 0.0 oz/week    0 Standard drinks or equivalent per week     Comment: socially  . Drug Use: No  . Sexual Activity: No   Other Topics Concern  . 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      Home Situation: lives alone      Spiritual Beliefs: none       Lifestyle: going to start exercise - diet is ok              Current outpatient prescriptions:  .  benzonatate (TESSALON PERLES) 100 MG capsule, Take 1 capsule (100 mg total) by mouth 3 (three) times daily as needed., Disp: 20 capsule, Rfl: 0 .  cetirizine (ZYRTEC) 10 MG tablet, Take 10 mg by mouth daily., Disp: , Rfl:  .  goserelin (ZOLADEX) 10.8 MG injection, Inject 10.8 mg into the skin every 3 (three) months., Disp: , Rfl:  .  Rivaroxaban 15 & 20 MG TBPK, Take 15 mg by mouth as directed. Take as directed on package: Start with one 45m tablet by mouth twice a day with food. On Day 22, switch to one 245mtablet once a day with food., Disp: 51 each, Rfl: 0 No current facility-administered medications for this visit.  Facility-Administered Medications Ordered in Other Visits:  .  goserelin (ZOLADEX) injection 10.8 mg, 10.8 mg, Subcutaneous, Q90 days, HeLaurie PandaNP, 10.8 mg at 10/25/15 1625  EXAM:  Filed Vitals:   05/09/16 1329  BP: 108/82  Pulse: 100  Temp: 98.7 F (37.1 C)    Body mass index is 25.23 kg/(m^2).  GENERAL: vitals reviewed and listed above, alert, oriented, appears well hydrated and in no acute distress  HEENT: atraumatic, conjunttiva clear, no obvious abnormalities on inspection of external nose and ears, normal appearance of ear canals and TMs, clear nasal congestion, mild post oropharyngeal erythema with PND, no tonsillar edema or exudate, no sinus TTP  NECK: no obvious masses on inspection  LUNGS: clear to auscultation bilaterally, no wheezes, rales or rhonchi, good air movement  CV: HRRR, no peripheral edema or erythema  MS: moves all extremities without noticeable abnormality  PSYCH: pleasant and cooperative, no obvious depression or anxiety  ASSESSMENT AND PLAN: >40 minute spent in caring for this patient with > 50% spent face to face with patient in counseling.  Discussed the following assessment and plan:  Acute deep vein thrombosis  (DVT) of proximal vein of right lower extremity (HCC)  Allergic rhinitis, unspecified allergic rhinitis type  GAD (generalized anxiety disorder)  -discussed implications and causes and potential complications of DVT -xarelto 159mid for 21 days, then 70m33mily, possibly indefinitely given her history - will refer to hematology for evaluation for underlying etiology and to confirm recommendations regarding anticoagulation. Discussed serious and common risks/interaction this medication. She [facility], but would if it was felt to be necessary. -she has cardiology visit pending per her preference for the tachycardia -We discussed treatment options for anxiety and she opted to start with CBT. Information provided  to schedule. Return to clinic in 3 months and sooner if needed. -Okay to take Zyrtec and the Tessalon for the rhinitis and cough. -she reports pap is scheduled with her gynecologist later this month -she thinks she had her Tdap in the last 10 years and wants to check before getting -Patient advised to return or notify a doctor immediately if symptoms worsen or persist or new concerns arise.  Patient Instructions  BEFORE YOU LEAVE: -schedule follow up in about 3 months  Xarelto: -from time of diagnosis 15 mg twice daily for 21 days -then, 3m daily  We placed a referral for you as discussed to the hematologist. It usually takes about 1-2 weeks to process and schedule this referral. If you have not heard [place] in 2 [place] please contact our office.  Keep the appointment with the cardiologist for the fast heart rate and with your gynecologist for the pap smear.  Ok to use the tessalon and the zyrtec for the allergies/cough/congestion.  Schedule cognitive behavioral therapy to help with anxiety and stress. Please call number provided today to schedule.  We recommend the following healthy lifestyle measures: - eat a  healthy whole foods diet consisting of regular small meals composed of vegetables, fruits, beans, nuts, seeds, healthy meats such as white chicken and fish and whole grains.  - avoid sweets, white starchy foods, fried foods, fast food, processed foods, sodas, red meet and other fattening foods.  - get a least 150-300 minutes of aerobic exercise per week.       KColin BentonR.

## 2016-05-09 NOTE — Progress Notes (Signed)
Pre visit review using our clinic review tool, if applicable. No additional management support is needed unless otherwise documented below in the visit note. 

## 2016-05-09 NOTE — Patient Instructions (Signed)
BEFORE YOU LEAVE: -schedule follow up in about 3 months  Xarelto: -from time of diagnosis 15 mg twice daily for 21 days -then, 20mg  daily  We placed a referral for you as discussed to the hematologist. It usually takes about 1-2 weeks to process and schedule this referral. If you have not heard from [place] regarding this appointment in 2 weeks please contact our office.  Keep the appointment with the cardiologist for the fast heart rate and with your gynecologist for the pap smear.  Ok to use the tessalon and the zyrtec for the allergies/cough/congestion.  Schedule cognitive behavioral therapy to help with anxiety and stress. Please call number provided today to schedule.  We recommend the following healthy lifestyle measures: - eat a healthy whole foods diet consisting of regular small meals composed of vegetables, fruits, beans, nuts, seeds, healthy meats such as white chicken and fish and whole grains.  - avoid sweets, white starchy foods, fried foods, fast food, processed foods, sodas, red meet and other fattening foods.  - get a least 150-300 minutes of aerobic exercise per week.

## 2016-05-14 ENCOUNTER — Other Ambulatory Visit: Payer: Self-pay | Admitting: Nurse Practitioner

## 2016-05-14 ENCOUNTER — Ambulatory Visit (INDEPENDENT_AMBULATORY_CARE_PROVIDER_SITE_OTHER): Payer: Commercial Managed Care - PPO | Admitting: Obstetrics and Gynecology

## 2016-05-14 ENCOUNTER — Encounter: Payer: Self-pay | Admitting: Obstetrics and Gynecology

## 2016-05-14 VITALS — BP 124/82 | HR 120 | Resp 16 | Wt 170.0 lb

## 2016-05-14 DIAGNOSIS — Z124 Encounter for screening for malignant neoplasm of cervix: Secondary | ICD-10-CM | POA: Diagnosis not present

## 2016-05-14 DIAGNOSIS — Z01419 Encounter for gynecological examination (general) (routine) without abnormal findings: Secondary | ICD-10-CM | POA: Diagnosis not present

## 2016-05-14 DIAGNOSIS — I824Y1 Acute embolism and thrombosis of unspecified deep veins of right proximal lower extremity: Secondary | ICD-10-CM

## 2016-05-14 DIAGNOSIS — R000 Tachycardia, unspecified: Secondary | ICD-10-CM

## 2016-05-14 DIAGNOSIS — Z853 Personal history of malignant neoplasm of breast: Secondary | ICD-10-CM

## 2016-05-14 NOTE — Progress Notes (Signed)
Received a call from [facility] in new patient scheduling regarding patient being referred to a hematologist here in the cancer center.  She stated that we should touch base with the patient.  Writer spoke with Nira Conn, NP who stated that patient is on xerelto and is okay for now.  Special labs will be ordered two weeks prior to patient's appt on June 19th at 4:00 with Dr. Jana Hakim.

## 2016-05-14 NOTE — Progress Notes (Signed)
Patient ID: Jenna Gonzales, female   DOB: [birth year], 45 y.o.   MRN: [id number]     Cardiology Office Note   Date:  05/15/2016   ID:  Jenna Gonzales, DOB 1971/06/09, MRN [id number]  PCP:  Lucretia Kern., DO  Cardiologist:   Jenkins Rouge, MD   Chief Complaint  Patient presents with  . Establish Care    Possible Sinus Tach, per pt      History of Present Illness:  DVT Tachycardia   Jenna Gonzales is a pleasant 45 y.o.  with a past medical history of breast cancer, anxiety, depression, DVT, pulmonary embolism, allergic rhinitis, borderline hypertension and abnormal uterine bleeding here for follow up after a recent ER visit for a DVT. She had surgery a few weeks ago to rove a uterine polyp. She developed R leg swelling and pain 2 weeks ago and was seen in ER and was started on xarelto. Reports she was told prior DVT/PE was from tamoxifen. CT PE study was negative.  She reports the swelling has subsided and her leg and the pain is much improved   She denies chest pain or shortness of breath. She continues to have a fast heart rate, and when she checks it seems to be higher.   She does admit to be having more anxiety lately. She has a history of depression and anxiety, and reports she usually does okay until she is dealing with extra stress. Recently she is dealing with the loss of her friend who died from breast cancer, loss of her job and now the blood clot. She has generalized worry on most days.  05/06/16  CTA no PE review shows no coronary calcium and normal origins of ostium   Past Medical History  Diagnosis Date  . History of breast cancer oncologist-  dr Ron Agee-- no recurrence    dx 11/ 2010  s/p  right lumpectomy w/ snl dissection 11-30-2009/  Stage 2B, DCIS, grade 1, (T2 N1),  ER/PR+,  HER2 negative/  chemo completed May 2011 and radiation completed July 2011 and completed anastrozole July 2016  . Anxiety   . History of DVT of lower extremity    09-22-2013  right lower extremitiy (femoral, popliteal, posterior tibial)  . History of pulmonary embolus (PE)     09-22-2013  . Osteopenia   . Allergic rhinitis   . Endometrial polyp   . Abnormal uterine bleeding (AUB)   . History of radiation therapy     right breast -- complete 07/ 2011  . History of cancer chemotherapy     right breast-- complete 04/2010  . Borderline hypertension   . Wears contact lenses     Past Surgical History  Procedure Laterality Date  . Breast lumpectomy with axillary lymph node dissection Right 11-30-2009  . Port-a-cath placement /  removal  01-10-2010/  removal 05-09-2010  . Dilatation & curettage/hysteroscopy with myosure N/A 04/29/2016    Procedure: DILATATION & CURETTAGE/POLYPECTOMY/HYSTEROSCOPY WITH MYOSURE;  Surgeon: [name], MD;  Location: [facility];  Service: Gynecology;  Laterality: N/A;  multiple endometrial polyps  . Cryosurgery cervix      2012     Current Outpatient Prescriptions  Medication Sig Dispense Refill  . benzonatate (TESSALON) 100 MG capsule Take 100 mg by mouth 3 (three) times daily as needed for cough.    Marland Kitchen goserelin (ZOLADEX) 10.8 MG injection Inject 10.8 mg into the skin every 3 (three) months.    . Rivaroxaban 15 & 20  MG TBPK Take 15 mg by mouth as directed. Take as directed on package: Start with one 87m tablet by mouth twice a day with food. On Day 22, switch to one 230mtablet once a day with food. 51 each 0   No current facility-administered medications for this visit.   Facility-Administered Medications Ordered in Other Visits  Medication Dose Route Frequency Provider Last Rate Last Dose  . goserelin (ZOLADEX) injection 10.8 mg  10.8 mg Subcutaneous Q90 days HeLaurie PandaNP   10.8 mg at 10/25/15 1625    Allergies:   Tamoxifen    Social History:  The patient  reports that she has never smoked. She has never used smokeless tobacco. She reports that she drinks alcohol. She reports  that she does not use illicit drugs.   Family History:  The patient's family history includes Diabetes in her other; Leukemia in her father.    ROS:  Please see the history of present illness.   Otherwise, review of systems are positive for none.   All other systems are reviewed and negative.    PHYSICAL EXAM: VS:  BP 110/80 mmHg  Pulse 122  Ht 5' 8.5" (1.74 m)  Wt 76.567 kg (168 lb 12.8 oz)  BMI 25.29 kg/m2  SpO2 97% , BMI Body mass index is 25.29 kg/(m^2). Affect appropriate Healthy:  appears stated age HE55normal Neck supple with no adenopathy JVP normal no bruits no thyromegaly Lungs clear with no wheezing and good diaphragmatic motion Heart:  S1/S2 no murmur, no rub, gallop or click PMI normal Abdomen: benighn, BS positve, no tenderness, no AAA no bruit.  No HSM or HJR Distal pulses intact with no bruits No edema Neuro non-focal Skin warm and dry No muscular weakness    EKG:   05/07/16 ST rate 128 low voltage poor R wave progression    Recent Labs: 07/12/2015: ALT 17; Platelets 195 05/06/2016: BUN 7; Creatinine, Ser 0.70; Hemoglobin 17.3*; Potassium 3.4*; Sodium 144    Lipid Panel No results found for: CHOL, TRIG, HDL, CHOLHDL, VLDL, LDLCALC, LDLDIRECT    Wt Readings from Last 3 Encounters:  05/15/16 76.567 kg (168 lb 12.8 oz)  05/14/16 77.111 kg (170 lb)  05/09/16 75.252 kg (165 lb 14.4 oz)      Other studies Reviewed: Additional studies/ records that were reviewed today include:  Primary care notes ER visit for DVT  ECG;s LE venous duplex     ASSESSMENT AND PLAN:  1.  Tachycardia Driven by anxiety. Check TSH/T4 today echo to r/o LV dysfunction. Start lopressor 25 bid  F/u next available  Also told her to stop zyrtec as this can cause tachycardia and substitute claritin 2. DVT  Continue xarelto has f/u with heme but will need to be off anticoagulation for hypercoagulable w/u 3. Anxiety  Consider Rx with anxiolytic SSRI f/u primary 4. Breast Cancer    F/u oncology mammogram and screeing   Current medicines are reviewed at length with the patient today.  The patient does not have concerns regarding medicines.  The following changes have been made:  Lopressor bid  Labs/ tests ordered today include: TSH/T4  No orders of the defined types were placed in this encounter.     Disposition:   FU with me next available     Signed, PeJenkins RougeMD  05/15/2016 11:42 AM    [facility] [address]  [id number]: [phone]: [phone]

## 2016-05-14 NOTE — Patient Instructions (Signed)

## 2016-05-14 NOTE — Progress Notes (Signed)
Patient ID: Jenna Gonzales, female   DOB: June 10, 1971, 45 y.o.   MRN: [id number] 45 y.o. G1P1001 SingleAfrican AmericanF here for annual exam.  The patient has a h/o breast cancer and stopped her cycles in 2011 with chemotherapy. She was on tamoxifen for 3 years, she went off it when she had a PE. She is a couple of weeks s/p hysteroscopy, polypectomy, D&C, normal pathology. Pre surgery biopsy with proliferative endometrium.  She was diagnosed with a DVT last week. She is on Xaralto and is going to f/u with hematology. She is also being followed for tachycardia and has an appointment to see a cardiologist tomorrow. She denies chest pain, no SOB, feeling okay. Dealing with sinus issues.  She has a new partner, not sexually active together yet.     No LMP recorded. Patient is postmenopausal.          Sexually active: Yes.    The current method of family planning is post menopausal status.    Exercising: No.  The patient does not participate in regular exercise at present. Smoker:  no  Health Maintenance: Pap:  2014 WNL per patient  History of abnormal Pap:  yes h/o cryosurgery in 2012 MMG:  06-09-15 WNL  Colonoscopy:  Never BMD:   Never TDaP:  Unsure  Gardasil: N/A   reports that she has never smoked. She has never used smokeless tobacco. She reports that she drinks alcohol. She reports that she does not use illicit drugs.She got fired from her job because of her illness. She has one son and 3 grandchildren, she see's them a lot.   Past Medical History  Diagnosis Date  . History of breast cancer oncologist-  dr Ron Agee-- no recurrence    dx 11/ 2010  s/p  right lumpectomy w/ snl dissection 11-30-2009/  Stage 2B, DCIS, grade 1, (T2 N1),  ER/PR+,  HER2 negative/  chemo completed May 2011 and radiation completed July 2011 and completed anastrozole July 2016  . Anxiety   . History of DVT of lower extremity     09-22-2013  right lower extremitiy (femoral, popliteal, posterior tibial)  .  History of pulmonary embolus (PE)     09-22-2013  . Osteopenia   . Allergic rhinitis   . Endometrial polyp   . Abnormal uterine bleeding (AUB)   . History of radiation therapy     right breast -- complete 07/ 2011  . History of cancer chemotherapy     right breast-- complete 04/2010  . Borderline hypertension   . Wears contact lenses     Past Surgical History  Procedure Laterality Date  . Breast lumpectomy with axillary lymph node dissection Right 11-30-2009  . Port-a-cath placement /  removal  01-10-2010/  removal 05-09-2010  . Dilatation & curettage/hysteroscopy with myosure N/A 04/29/2016    Procedure: DILATATION & CURETTAGE/POLYPECTOMY/HYSTEROSCOPY WITH MYOSURE;  Surgeon: [name], MD;  Location: [facility];  Service: Gynecology;  Laterality: N/A;  multiple endometrial polyps  . Cryosurgery cervix      2012    Current Outpatient Prescriptions  Medication Sig Dispense Refill  . benzonatate (TESSALON PERLES) 100 MG capsule Take 1 capsule (100 mg total) by mouth 3 (three) times daily as needed. 20 capsule 0  . cetirizine (ZYRTEC) 10 MG tablet Take 10 mg by mouth daily.    Marland Kitchen goserelin (ZOLADEX) 10.8 MG injection Inject 10.8 mg into the skin every 3 (three) months.    . Rivaroxaban 15 & 20 MG TBPK  Take 15 mg by mouth as directed. Take as directed on package: Start with one 60m tablet by mouth twice a day with food. On Day 22, switch to one 242mtablet once a day with food. 51 each 0   No current facility-administered medications for this visit.   Facility-Administered Medications Ordered in Other Visits  Medication Dose Route Frequency Provider Last Rate Last Dose  . goserelin (ZOLADEX) injection 10.8 mg  10.8 mg Subcutaneous Q90 days HeLaurie PandaNP   10.8 mg at 10/25/15 1625    Family History  Problem Relation Age of Onset  . Leukemia Father   . Arthritis    . Heart disease    . Hypertension    . Kidney disease    . Diabetes    . Diabetes  Other     Review of Systems  Constitutional: Negative.   HENT: Negative.   Eyes: Negative.   Respiratory: Negative.   Cardiovascular: Negative.   Gastrointestinal: Negative.   Endocrine: Negative.   Genitourinary: Negative.   Musculoskeletal: Negative.   Skin: Negative.   Allergic/Immunologic: Negative.   Neurological: Negative.   Psychiatric/Behavioral: Negative.     Exam:   BP 124/82 mmHg  Pulse 120  Resp 16  Wt 170 lb (77.111 kg)  Weight change: _0 @ Height:      Ht Readings from Last 3 Encounters:  05/09/16 _1  (1.727 m)  05/06/16 _2  (1.727 m)  05/02/16 5' 8.5" (1.74 m)    General appearance: alert, cooperative and appears stated age Head: Normocephalic, without obvious abnormality, atraumatic Neck: no adenopathy, supple, symmetrical, trachea midline and thyroid normal to inspection and palpation Lungs: clear to auscultation bilaterally Breasts: normal appearance, no masses or tenderness, evidence of right lumpectomy Heart: tachycardic, regular rhythm, no murmurs Abdomen: soft, non-tender; bowel sounds normal; no masses,  no organomegaly Extremities: extremities normal, atraumatic, no cyanosis or edema Skin: Skin color, texture, turgor normal. No rashes or lesions Lymph nodes: Cervical, supraclavicular, and axillary nodes normal. No abnormal inguinal nodes palpated Neurologic: Grossly normal   Pelvic: External genitalia:  no lesions              Urethra:  normal appearing urethra with no masses, tenderness or lesions              Bartholins and Skenes: normal                 Vagina: normal appearing vagina with normal color and discharge, no lesions              Cervix: no lesions               Bimanual Exam:  Uterus:  normal size, contour, position, consistency, mobility, non-tender              Adnexa: no mass, fullness, tenderness               Rectovaginal: Confirms               Anus:  normal sphincter tone, no lesions  Chaperone was  present for exam.  A:  Well Woman with normal exam  H/O breast cancer  Recent hysteroscopy, polypectomy, D&C, negative pathology, not a candidate for progesterone treatment  Tachycardia, no symptoms  DVT, on anticoagulants. She will f/u with hematology  P:   Calendar any bleeding, call   Pap with hpv  Mammogram  F/U with cardiology tomorrow  Discussed breast self exam  Discussed calcium and vit D  intake

## 2016-05-15 ENCOUNTER — Ambulatory Visit: Payer: Commercial Managed Care - PPO | Admitting: Obstetrics and Gynecology

## 2016-05-15 ENCOUNTER — Encounter: Payer: Self-pay | Admitting: Cardiovascular Disease

## 2016-05-15 ENCOUNTER — Ambulatory Visit (INDEPENDENT_AMBULATORY_CARE_PROVIDER_SITE_OTHER): Payer: Commercial Managed Care - PPO | Admitting: Cardiovascular Disease

## 2016-05-15 VITALS — BP 110/80 | HR 122 | Ht 68.5 in | Wt 168.8 lb

## 2016-05-15 DIAGNOSIS — R0602 Shortness of breath: Secondary | ICD-10-CM

## 2016-05-15 DIAGNOSIS — Z0189 Encounter for other specified special examinations: Secondary | ICD-10-CM

## 2016-05-15 DIAGNOSIS — Z7189 Other specified counseling: Secondary | ICD-10-CM

## 2016-05-15 DIAGNOSIS — R000 Tachycardia, unspecified: Secondary | ICD-10-CM | POA: Diagnosis not present

## 2016-05-15 DIAGNOSIS — Z7689 Persons encountering health services in other specified circumstances: Secondary | ICD-10-CM

## 2016-05-15 LAB — IPS PAP TEST WITH HPV

## 2016-05-15 LAB — T4, FREE: FREE T4: 1.2 ng/dL (ref 0.8–1.8)

## 2016-05-15 LAB — TSH: TSH: 1.67 m[IU]/L

## 2016-05-15 MED ORDER — LORATADINE 10 MG PO CAPS: 1.0000 | ORAL_CAPSULE | ORAL | Status: AC | PRN

## 2016-05-15 MED ORDER — METOPROLOL TARTRATE 25 MG PO TABS: 25.0000 mg | ORAL_TABLET | Freq: Two times a day (BID) | ORAL | Status: DC

## 2016-05-15 MED FILL — METOPROLOL TARTRATE 25 MG T: 25 | 30 days supply | Qty: 60 | Fill #0

## 2016-05-15 NOTE — Patient Instructions (Signed)
Medication Instructions:  Your physician has recommended you make the following change in your medication:  1-START Lopressor 25 mg by mouth twice daily 2-STOP Zyrtec 3-TAKE Claritin instead  Lab work: Your physician recommends that you have lab work today- TSH and T4  Testing/Procedures: Your physician has requested that you have an echocardiogram. Echocardiography is a painless test that uses sound waves to create images of your heart. It provides your doctor with information about the size and shape of your heart and how well your heart's chambers and valves are working. This procedure takes approximately one hour. There are no restrictions for this procedure.  Follow-Up: Your physician wants you to follow-up next available with Dr. Johnsie Cancel   If you need a refill on your cardiac medications before your next appointment, please call your pharmacy.

## 2016-05-22 ENCOUNTER — Telehealth: Payer: Self-pay | Admitting: Family Medicine

## 2016-05-22 ENCOUNTER — Other Ambulatory Visit: Payer: Self-pay | Admitting: Oncology

## 2016-05-22 MED ORDER — RIVAROXABAN (XARELTO) VTE STARTER PACK (15 & 20 MG): 15.0000 mg | ORAL_TABLET | ORAL | Status: DC

## 2016-05-22 MED ORDER — RIVAROXABAN 20 MG PO TABS: 20.0000 mg | ORAL_TABLET | Freq: Every day | ORAL | Status: DC

## 2016-05-22 MED FILL — XARELTO 20 MG TABLET: 20 | 30 days supply | Qty: 30 | Fill #0

## 2016-05-22 NOTE — Telephone Encounter (Signed)
Printed for Dr. Burchette to sign. 

## 2016-05-22 NOTE — Telephone Encounter (Signed)
Correct dose sent into the pharmacy after reading HPI in note.

## 2016-05-22 NOTE — Telephone Encounter (Signed)
Pt lost the following rx Rivaroxaban 15 & 20 MG TBPK and is asking if this can be called in.  I did let her known that Dr [name] is out of the office today    Pharmacy [facility]

## 2016-05-22 NOTE — Telephone Encounter (Signed)
RX faxed to pharmacy.

## 2016-05-22 NOTE — Addendum Note (Signed)
Addended by: Elio Forget on: 05/22/2016 03:09 PM   Modules accepted: Orders

## 2016-05-22 NOTE — Telephone Encounter (Signed)
Medication sent in for patient. 

## 2016-05-22 NOTE — Telephone Encounter (Addendum)
Pt states the wrong rx was faxed in to pharmacy. This was the starter pack.  Pt has already done.  Need week 4 /  20 mg 1 x /day.  ASAP,  Thanks  [facility]

## 2016-05-22 NOTE — Telephone Encounter (Signed)
Pt states her ins will end today

## 2016-05-23 ENCOUNTER — Telehealth: Payer: Self-pay | Admitting: Oncology

## 2016-05-23 ENCOUNTER — Telehealth: Payer: Self-pay | Admitting: Family Medicine

## 2016-05-23 DIAGNOSIS — I824Y1 Acute embolism and thrombosis of unspecified deep veins of right proximal lower extremity: Secondary | ICD-10-CM

## 2016-05-23 MED ORDER — RIVAROXABAN 20 MG PO TABS: 20.0000 mg | ORAL_TABLET | Freq: Every day | ORAL | Status: DC

## 2016-05-23 NOTE — Addendum Note (Signed)
Addended by: Agnes Lawrence on: 05/23/2016 02:52 PM   Modules accepted: Orders

## 2016-05-23 NOTE — Telephone Encounter (Signed)
Can we get her one month samples. Please put her in touch with the [place] office for help with insurance/coverage if no insurance. Thanks.

## 2016-05-23 NOTE — Telephone Encounter (Signed)
Received a fax from [facility] for pa on [address].  Tried to submit electronically on CoverMyMeds and also called insurance plan.  Patient's insurance has ended.  Called the pt.  She is unsure when she will be able to get insurance again.  She was able to pick up her medication from the pharmacy before her plan ended.  Is there someway we are able to help the pt get her medication next month?

## 2016-05-23 NOTE — Telephone Encounter (Signed)
Spoke with referring provider after Dr. Jana Hakim reviewed pt chart, pt will keep current June appt and he will f/u with pt at that time.

## 2016-05-23 NOTE — Telephone Encounter (Signed)
I called the pt and left a detailed message a referral was entered for [facility] to help with her medications and a month supply of Xarelto 20mg  was left at the front desk for her to pick up.

## 2016-05-27 ENCOUNTER — Other Ambulatory Visit (HOSPITAL_BASED_OUTPATIENT_CLINIC_OR_DEPARTMENT_OTHER): Payer: Commercial Managed Care - PPO

## 2016-05-27 DIAGNOSIS — C50919 Malignant neoplasm of unspecified site of unspecified female breast: Secondary | ICD-10-CM

## 2016-05-27 DIAGNOSIS — I824Y1 Acute embolism and thrombosis of unspecified deep veins of right proximal lower extremity: Secondary | ICD-10-CM

## 2016-05-28 LAB — PROTEIN C, TOTAL: PROTEIN C ANTIGEN: 171 % — AB (ref 60–150)

## 2016-05-29 LAB — BETA-2-GLYCOPROTEIN I ABS, IGG/M/A
Beta-2 Glyco 1 IgA: <9 GPI IgA units (ref 0–25)
Beta-2 Glyco 1 IgM: <9 GPI IgM units (ref 0–32)

## 2016-05-29 LAB — CARDIOLIPIN ANTIBODIES, IGG, IGM, IGA

## 2016-05-29 LAB — PROTEIN S, TOTAL: Protein S, Total: 144 % (ref 60–150)

## 2016-05-29 LAB — ANTITHROMBIN III: ANTITHROMBIN ACTIVITY: 205 % — AB (ref 75–135)

## 2016-05-29 LAB — PROTEIN S ACTIVITY: Protein S-Functional: >259 % — ABNORMAL HIGH (ref 63–140)

## 2016-05-29 LAB — PROTEIN C ACTIVITY: PROTEIN C ACTIVITY: 179 % (ref 73–180)

## 2016-05-30 ENCOUNTER — Ambulatory Visit (HOSPITAL_COMMUNITY): Payer: Commercial Managed Care - PPO | Attending: Cardiovascular Disease

## 2016-05-30 ENCOUNTER — Other Ambulatory Visit: Payer: Self-pay

## 2016-05-30 DIAGNOSIS — Z7689 Persons encountering health services in other specified circumstances: Secondary | ICD-10-CM

## 2016-05-30 DIAGNOSIS — R000 Tachycardia, unspecified: Secondary | ICD-10-CM | POA: Diagnosis not present

## 2016-05-30 DIAGNOSIS — R0602 Shortness of breath: Secondary | ICD-10-CM

## 2016-05-30 DIAGNOSIS — Z7189 Other specified counseling: Secondary | ICD-10-CM | POA: Insufficient documentation

## 2016-05-30 LAB — ECHOCARDIOGRAM COMPLETE
AOASC: 32 cm
AVLVOTPG: 4 mmHg
E decel time: 134 msec
EERAT: 9.37
FS: 45 % — AB (ref 28–44)
IV/PV OW: 1.33
LA diam end sys: 37 mm
LA vol: 38 mL
LADIAMINDEX: 1.94 cm/m2
LASIZE: 37 mm
LAVOLA4C: 38 mL
LAVOLIN: 19.9 mL/m2
LV TDI E'LATERAL: 11.1
LVEEAVG: 9.37
LVEEMED: 9.37
LVELAT: 11.1 cm/s
LVOT SV: 65 mL
LVOT VTI: 20.7 cm
LVOT area: 3.14 cm2
LVOT diameter: 20 mm
LVOT peak vel: 98.6 cm/s
MV Dec: 134
MV Peak grad: 4 mmHg
MV pk E vel: 104 m/s
MVPKAVEL: 107 m/s
PW: 7.25 mm — AB (ref 0.6–1.1)
TDI e' medial: 7.46

## 2016-05-30 LAB — LUPUS ANTICOAGULANT PANEL
PTT-LA: 36.6 s (ref 0.0–43.6)
dRVVT Confirm: 1.7 ratio — ABNORMAL HIGH (ref 0.8–1.2)
dRVVT Mix: 94.5 s — ABNORMAL HIGH (ref 0.0–47.0)
dRVVT: 132.4 s — ABNORMAL HIGH (ref 0.0–47.0)

## 2016-06-04 ENCOUNTER — Telehealth: Payer: Self-pay | Admitting: *Deleted

## 2016-06-04 ENCOUNTER — Encounter: Payer: Self-pay | Admitting: Cardiovascular Disease

## 2016-06-04 ENCOUNTER — Ambulatory Visit (INDEPENDENT_AMBULATORY_CARE_PROVIDER_SITE_OTHER): Payer: Commercial Managed Care - PPO | Admitting: Cardiovascular Disease

## 2016-06-04 VITALS — BP 124/80 | HR 79 | Ht 68.5 in | Wt 168.1 lb

## 2016-06-04 DIAGNOSIS — I471 Supraventricular tachycardia: Secondary | ICD-10-CM | POA: Diagnosis not present

## 2016-06-04 NOTE — Progress Notes (Signed)
Patient ID: Jenna Gonzales, female   DOB: 08-18-1971, 45 y.o.   MRN: [id number]     Cardiology Office Note   Date:  06/04/2016   ID:  Jenna Gonzales, DOB [birth year], MRN [id number]  PCP:  Lucretia Kern., DO  Cardiologist:   Jenkins Rouge, MD   No chief complaint on file.     History of Present Illness:  DVT Tachycardia   Jenna Gonzales is a pleasant 45 y.o.  with a past medical history of breast cancer, anxiety, depression, DVT, pulmonary embolism, allergic rhinitis, borderline hypertension and abnormal uterine bleeding here for follow up after a recent ER visit for a DVT. She had surgery a few weeks ago to rove a uterine polyp. She developed R leg swelling and pain 2 weeks ago and was seen in ER and was started on xarelto. Reports she was told prior DVT/PE was from tamoxifen. CT PE study was negative.  She reports the swelling has subsided and her leg and the pain is much improved   She denies chest pain or shortness of breath. She continues to have a fast heart rate, and when she checks it seems to be higher.   She does admit to be having more anxiety lately. She has a history of depression and anxiety, and reports she usually does okay until she is dealing with extra stress. Recently she is dealing with the loss of her friend who died from breast cancer, loss of her job and now the blood clot. She has generalized worry on most days.  05/06/16  CTA no PE review shows no coronary calcium and normal origins of ostium   Echo:  05/30/16 normal EF 65% no valve Dx or cor pulmonale  Lab Results  Component Value Date   TSH 1.67 05/15/2016   Started on lopressor 05/15/16    Past Medical History  Diagnosis Date  . History of breast cancer oncologist-  dr Ron Agee-- no recurrence    dx 11/ 2010  s/p  right lumpectomy w/ snl dissection 11-30-2009/  Stage 2B, DCIS, grade 1, (T2 N1),  ER/PR+,  HER2 negative/  chemo completed May 2011 and radiation completed July 2011 and  completed anastrozole July 2016  . Anxiety   . History of DVT of lower extremity     09-22-2013  right lower extremitiy (femoral, popliteal, posterior tibial)  . History of pulmonary embolus (PE)     09-22-2013  . Osteopenia   . Allergic rhinitis   . Endometrial polyp   . Abnormal uterine bleeding (AUB)   . History of radiation therapy     right breast -- complete 07/ 2011  . History of cancer chemotherapy     right breast-- complete 04/2010  . Borderline hypertension   . Wears contact lenses     Past Surgical History  Procedure Laterality Date  . Breast lumpectomy with axillary lymph node dissection Right 11-30-2009  . Port-a-cath placement /  removal  01-10-2010/  removal 05-09-2010  . Dilatation & curettage/hysteroscopy with myosure N/A 04/29/2016    Procedure: DILATATION & CURETTAGE/POLYPECTOMY/HYSTEROSCOPY WITH MYOSURE;  Surgeon: [name], MD;  Location: [facility];  Service: Gynecology;  Laterality: N/A;  multiple endometrial polyps  . Cryosurgery cervix      2012     Current Outpatient Prescriptions  Medication Sig Dispense Refill  . goserelin (ZOLADEX) 10.8 MG injection Inject 10.8 mg into the skin every 3 (three) months.    . Loratadine 10 MG CAPS Take  1 capsule (10 mg total) by mouth as needed (allergies). 30 each   . metoprolol tartrate (LOPRESSOR) 25 MG tablet Take 1 tablet (25 mg total) by mouth 2 (two) times daily. 180 tablet 3  . rivaroxaban (XARELTO) 20 MG TABS tablet Take 1 tablet (20 mg total) by mouth daily with supper. 90 tablet 1   No current facility-administered medications for this visit.   Facility-Administered Medications Ordered in Other Visits  Medication Dose Route Frequency Provider Last Rate Last Dose  . goserelin (ZOLADEX) injection 10.8 mg  10.8 mg Subcutaneous Q90 days Laurie Panda, NP   10.8 mg at 10/25/15 1625    Allergies:   Tamoxifen    Social History:  The patient  reports that she has never smoked. She  has never used smokeless tobacco. She reports that she drinks alcohol. She reports that she does not use illicit drugs.   Family History:  The patient's family history includes Diabetes in her other; Leukemia in her father.    ROS:  Please see the history of present illness.   Otherwise, review of systems are positive for none.   All other systems are reviewed and negative.    PHYSICAL EXAM: VS:  BP 124/80 mmHg  Pulse 79  Ht 5' 8.5" (1.74 m)  Wt 76.259 kg (168 lb 1.9 oz)  BMI 25.19 kg/m2  SpO2 97% , BMI Body mass index is 25.19 kg/(m^2). Affect appropriate Healthy:  appears stated age 7: normal Neck supple with no adenopathy JVP normal no bruits no thyromegaly Lungs clear with no wheezing and good diaphragmatic motion Heart:  S1/S2 no murmur, no rub, gallop or click PMI normal Abdomen: benighn, BS positve, no tenderness, no AAA no bruit.  No HSM or HJR Distal pulses intact with no bruits No edema Neuro non-focal Skin warm and dry No muscular weakness    EKG:   05/07/16 ST rate 128 low voltage poor R wave progression    Recent Labs: 07/12/2015: ALT 17; Platelets 195 05/06/2016: BUN 7; Creatinine, Ser 0.70; Hemoglobin 17.3*; Potassium 3.4*; Sodium 144 05/15/2016: TSH 1.67    Lipid Panel No results found for: CHOL, TRIG, HDL, CHOLHDL, VLDL, LDLCALC, LDLDIRECT    Wt Readings from Last 3 Encounters:  06/04/16 76.259 kg (168 lb 1.9 oz)  05/15/16 76.567 kg (168 lb 12.8 oz)  05/14/16 77.111 kg (170 lb)      Other studies Reviewed: Additional studies/ records that were reviewed today include:  Primary care notes ER visit for DVT  ECG;s LE venous duplex     ASSESSMENT AND PLAN:  1.  Tachycardia  reactonary related to anxiety Echo normal continue beta blocker improved  Also told her to stop zyrtec as this can cause tachycardia and substitute claritin 2. DVT  Continue xarelto has f/u with heme but will need to be off anticoagulation for hypercoagulable w/u 3.  Anxiety  Consider Rx with anxiolytic SSRI f/u primary 4. Breast Cancer   F/u oncology mammogram and screeing   Current medicines are reviewed at length with the patient today.  The patient does not have concerns regarding medicines.  The following changes have been made:  Lopressor bid  Labs/ tests ordered today include: TSH/T4  No orders of the defined types were placed in this encounter.     Disposition:   FU with me next available     Signed, Jenkins Rouge, MD  06/04/2016 4:08 PM    [facility] [place], [place]  [address] Phone: [phone]; Fax: [phone]    

## 2016-06-04 NOTE — Telephone Encounter (Signed)
I called the pt and she stated she was confused by this call? States she already talked to Dr [name] about this at her last visit and was told she did not need to be on medications and stated she will call back for an appt if needed.

## 2016-06-04 NOTE — Patient Instructions (Addendum)

## 2016-06-10 ENCOUNTER — Telehealth: Payer: Self-pay | Admitting: Oncology

## 2016-06-10 ENCOUNTER — Ambulatory Visit (HOSPITAL_BASED_OUTPATIENT_CLINIC_OR_DEPARTMENT_OTHER): Payer: Commercial Managed Care - PPO | Admitting: Oncology

## 2016-06-10 VITALS — BP 137/84 | HR 83 | Temp 98.6°F | Resp 18 | Ht 68.5 in | Wt 170.3 lb

## 2016-06-10 DIAGNOSIS — C50411 Malignant neoplasm of upper-outer quadrant of right female breast: Secondary | ICD-10-CM

## 2016-06-10 DIAGNOSIS — Z7901 Long term (current) use of anticoagulants: Secondary | ICD-10-CM | POA: Diagnosis not present

## 2016-06-10 DIAGNOSIS — D6862 Lupus anticoagulant syndrome: Secondary | ICD-10-CM

## 2016-06-10 DIAGNOSIS — I82491 Acute embolism and thrombosis of other specified deep vein of right lower extremity: Secondary | ICD-10-CM | POA: Diagnosis not present

## 2016-06-10 DIAGNOSIS — Z79818 Long term (current) use of other agents affecting estrogen receptors and estrogen levels: Secondary | ICD-10-CM

## 2016-06-10 NOTE — Telephone Encounter (Signed)
appt made and calendar sent by mail

## 2016-06-10 NOTE — Progress Notes (Signed)
ID: Jenna Gonzales   DOB: 08-19-1971  MR#: [id number]  DEY#:[id number]  PCP: Lucretia Kern., DO GYN: [name], MD SU: Excell Seltzer, MD OTHER MD: Sumner Boast MD  CHIEF COMPLAINTS:  1) Hx of Right Breast Cancer      2) DVT/PE   CURRENT THERAPY: goserelin injections  BREAST CANCER HISTORY: From the original intake note:  The patient saw Dr. Garwin Brothers for a routine gynecologic evaluation, and Dr. Garwin Brothers did a breast exam which showed a palpable mass in the right breast.  She was referred for a diagnostic mammography 11/03/2009, and Dr. Claudie Revering was able to confirm a palpable mass in the 10 o'clock position of the right breast 7 cm from the nipple.  There was no palpable adenopathy.  By mammography the breasts were dense, but there was a spiculated mass in the upper outer right breast corresponding to the palpable mass by ultrasound as measured 2.1 cm.  It was hypoechoic, irregular, and poorly defined.  Ultrasound of the right axilla demonstrated several normal sized lymph nodes, several of them showing cortical thickening.    With this information, the patient was brought back on 11/19 for biopsy.  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  The lymph node biopsy obtained at the same time was negative.  The tumor was ER positive at 99%, PR positive at 20% with a low proliferation marker at 12%, and HER-2/neu was not amplified with a ratio of 1.45.  Bilateral breast MRIs were obtained 11/22.  This confirmed a 2.2 cm irregularly marginated enhancing mass in the upper outer right breast with a clip in the mass.  There were no other masses, and aside from post biopsy changes in the right axilla, there were no abnormal appearing lymph nodes.    The patient proceeded to right lumpectomy and sentinel lymph node sampling 12/09 under Dr. Excell Seltzer because the two sentinel lymph nodes provided positive for cancer.  Dr. Excell Seltzer  proceeded to a right axillary lymph node dissection in the same procedure.  The final pathology [id number]) showed an invasive ductal carcinoma, grade 1, with negative although close margins (the closest was 1 mm posteriorly), with evidence of lymphovascular invasion and involving 2 out of 17 lymph nodes sampled (the 2 sentinel lymph nodes).   Her subsequent history is as detailed below.  INTERVAL HISTORY: Jenna Gonzales returns today for for further evaluation of her hypercoagulable state, as well as follow-up of her breast cancer. She underwent lumpectomy with dilation and curettage 04/29/2016 for what proved to be a benign endometrial polyp ([id number]). A week later she presented to the emergency room with pain in her right calf and Doppler ultrasonography documented an acute right lower extremity DVT. Thinking back on it Jenna Gonzales tells me that she already had pain in the leg before the surgery, and had swelling of the right ankle on and off. Accordingly it is possible that the clot preceded the surgery. In any case this was her second clot and we obtained a hypercoagulable panel last week which was positive for lupus anticoagulant. All the other tests were normal  As far as breast cancer is concerned she is due for mammography this month. She continues on monthly goserelin. She tolerates that well.  REVIEW OF SYSTEMS: Jenna Gonzales has mild insomnia problems, chiefly relating to hot flashes. She feels a little anxious. She is tolerating the Xarelto with no bleeding or bruising problems. The pain in the right leg has resolved. She has  had no further swelling. Adetailed review of systems today was otherwise stable  PAST MEDICAL HISTORY: Past Medical History  Diagnosis Date  . History of breast cancer oncologist-  dr Ron Agee-- no recurrence    dx 11/ 2010  s/p  right lumpectomy w/ snl dissection 11-30-2009/  Stage 2B, DCIS, grade 1, (T2 N1),  ER/PR+,  HER2 negative/  chemo completed May 2011 and radiation completed  July 2011 and completed anastrozole July 2016  . Anxiety   . History of DVT of lower extremity     09-22-2013  right lower extremitiy (femoral, popliteal, posterior tibial)  . History of pulmonary embolus (PE)     09-22-2013  . Osteopenia   . Allergic rhinitis   . Endometrial polyp   . Abnormal uterine bleeding (AUB)   . History of radiation therapy     right breast -- complete 07/ 2011  . History of cancer chemotherapy     right breast-- complete 04/2010  . Borderline hypertension   . Wears contact lenses   Mild GERD, history of a heart murmur, which was not appreciated today, history of borderline hypertension not under treatment, history of irritable bowel disease, and history of minimal arthritic changes involving particularly the index finger of the right hand.  PAST SURGICAL HISTORY: Past Surgical History  Procedure Laterality Date  . Breast lumpectomy with axillary lymph node dissection Right 11-30-2009  . Port-a-cath placement /  removal  01-10-2010/  removal 05-09-2010  . Dilatation & curettage/hysteroscopy with myosure N/A 04/29/2016    Procedure: DILATATION & CURETTAGE/POLYPECTOMY/HYSTEROSCOPY WITH MYOSURE;  Surgeon: [name], MD;  Location: [facility];  Service: Gynecology;  Laterality: N/A;  multiple endometrial polyps  . Cryosurgery cervix      2012    FAMILY HISTORY Family History  Problem Relation Age of Onset  . Leukemia Father   . Arthritis    . Heart disease    . Hypertension    . Kidney disease    . Diabetes    . Diabetes Other   The patient's father died from leukemia at the age of 63 (I cannot tell from the history if this was chronic or acute).  The patient's mother is alive at age 3.  The patient has 2 sisters and 1 brother, all here in [place].  She also has 2 half brothers living in [place].  There is no history of breast or ovarian cancer in the family.  GYNECOLOGIC HISTORY: (Updated 02/24/2014) She is GX, P1.  First  pregnancy to term age 59.  Last menstrual period was in March 2011 with the initiation of chemotherapy. Hormone levels drawn in October 2014 showed patient to  be premenopausal.   SOCIAL HISTORY:    (Updated 02/24/2014) The patient previously worked at [employer] in [place].  She worked for the [employer] for 8 years. She now works for [employer] and she is thinking of changing work again. 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 She is single. Her son Dahrahn lives with her.  He "could never finish school," and does not have a job at present.  He does have 2 daughters who live with their mother here in [place].  The patient attends [employer].    ADVANCED DIRECTIVES: Not in place  HEALTH MAINTENANCE: (Updated 02/24/2014) Social History  Substance Use Topics  . Smoking status: Never Smoker   . Smokeless tobacco: Never Used  .  Alcohol Use: 0.0 oz/week    0 Standard drinks or equivalent per week     Comment: socially     Colonoscopy: Never  PAP: UTD/Dr. Garwin Brothers  Bone density: Never/Scheduled for 03/11/2014  Lipid panel: Not on file   Allergies  Allergen Reactions  . Tamoxifen Other (See Comments)    Blood clots    Current Outpatient Prescriptions  Medication Sig Dispense Refill  . goserelin (ZOLADEX) 10.8 MG injection Inject 10.8 mg into the skin every 3 (three) months.    . Loratadine 10 MG CAPS Take 1 capsule (10 mg total) by mouth as needed (allergies). 30 each   . metoprolol tartrate (LOPRESSOR) 25 MG tablet Take 1 tablet (25 mg total) by mouth 2 (two) times daily. 180 tablet 3  . rivaroxaban (XARELTO) 20 MG TABS tablet Take 1 tablet (20 mg total) by mouth daily with supper. 90 tablet 1   No current facility-administered medications for this visit.   Facility-Administered Medications Ordered in Other Visits  Medication Dose Route Frequency Provider Last Rate Last Dose  . goserelin  (ZOLADEX) injection 10.8 mg  10.8 mg Subcutaneous Q90 days Laurie Panda, NP   10.8 mg at 10/25/15 1625    OBJECTIVE: Young African American woman In no acute distress Filed Vitals:   06/10/16 1549  BP: 137/84  Pulse: 83  Temp: 98.6 F (37 C)  Resp: 18     Body mass index is 25.51 kg/(m^2).    ECOG FS: 0 Filed Weights   06/10/16 1549  Weight: 170 lb 4.8 oz (77.248 kg)   Sclerae unicteric, EOMs intact Oropharynx clear, dentition in good repair No cervical or supraclavicular adenopathy Lungs no rales or rhonchi Heart regular rate and rhythm Abd soft, nontender, positive bowel sounds MSK no focal spinal tenderness, no upper extremity lymphedema Neuro: nonfocal, well oriented, appropriate affect Breasts: Deferred   LAB RESULTS: Lab Results  Component Value Date   WBC 7.1 07/12/2015   NEUTROABS 4.3 07/12/2015   HGB 17.3* 05/06/2016   HCT 51.0* 05/06/2016   MCV 100.2 07/12/2015   PLT 195 07/12/2015      Chemistry      Component Value Date/Time   NA 144 05/06/2016 1320   NA 140 07/12/2015 1229   K 3.4* 05/06/2016 1320   K 4.2 07/12/2015 1229   CL 105 05/06/2016 1320   CL 106 01/19/2013 1507   CO2 28 07/12/2015 1229   CO2 26 06/09/2014 1246   BUN 7 05/06/2016 1320   BUN 14.9 07/12/2015 1229   CREATININE 0.70 05/06/2016 1320   CREATININE 1.0 07/12/2015 1229      Component Value Date/Time   CALCIUM 10.1 07/12/2015 1229   CALCIUM 11.0* 06/09/2014 1246   ALKPHOS 143 07/12/2015 1229   ALKPHOS 80 07/16/2012 1406   AST 17 07/12/2015 1229   AST 18 07/16/2012 1406   ALT 17 07/12/2015 1229   ALT 19 07/16/2012 1406   BILITOT 0.41 07/12/2015 1229   BILITOT 0.4 07/16/2012 1406       STUDIES:  No results found.   ASSESSMENT: 45 y.o.  [place] woman   (1)  status post right breast upper outer quadrant lumpectomy and axillary lymph node dissection December of 2010 for a T2 N1, stage IIB invasive ductal carcinoma, grade 1, strongly estrogen receptor positive,  moderately progesterone receptor positive, HER-2 negative, with a low proliferation fraction;   (2)  treated adjuvantly with 6 cycles of docetaxel/ doxorubicin/ cyclophosphamide, completed in May of 2011,   (3)  followed by radiation completed July of 2011 at which time she started tamoxifen.  (4)  tamoxifen was discontinued in October 2014 when patient was diagnosed with a right lower extremity DVT and pulmonary embolism.   (a) anti-coagulation therapy with Xarelto, beginning 09/23/2013, completed 03/23/2014  (b) repeat Doppler ultrasonography 06/09/2014 finds no evidence of residual clot  (c) second acute right lower extremity DVT documented 05/06/2016 following uterine polypectomy  (d) hypercoagulable panel 05/27/2016 documents a lupus anticoagulant  (e) xarelto started May 2017, to be continued indefinitely  (5)  labs drawn in October 2014 confirmed a premenopausal status. Patient received her first Q3 month injection of goserelin on 12/02/2013, and then began on anastrozole at 1 mg daily.  (7) completed 5 years of anastrozole as of July 2016. Goserelin continued at patient's request    PLAN: Jenna Gonzales now has a documented lupus anticoagulant. She has a convincing history going along with it. I think she is going to have to be on anticoagulants indefinitely.  This is a very depressing idea to her. After much discussion we decided in one year we will repeat Doppler, and also the lupus anticoagulant. Even if it is negative at that time though I am going to recommend that she continue as she would be at high risk of a clot off anticoagulants.  As far as breast cancer is concerned she is doing very well, now 6-1/2 years out from definitive surgery with no evidence of disease recurrence. This is very favorable.  She wants to continue the goserelin. She did tolerate it well while on Xarelto previously. When she reaches 48 we will stop and reassess her for natural  menopause.  I'm going to see her  again in November. She knows to call for any problems that may develop before the next visit.  Chauncey Cruel, MD   06/10/2016

## 2016-06-11 ENCOUNTER — Ambulatory Visit: Payer: Commercial Managed Care - PPO | Admitting: Cardiovascular Disease

## 2016-06-12 MED FILL — METOPROLOL TARTRATE 25 MG T: 25 | 90 days supply | Qty: 180 | Fill #1

## 2016-07-03 ENCOUNTER — Other Ambulatory Visit: Payer: Self-pay | Admitting: Oncology

## 2016-07-03 ENCOUNTER — Other Ambulatory Visit: Payer: Self-pay | Admitting: *Deleted

## 2016-07-03 ENCOUNTER — Ambulatory Visit (HOSPITAL_BASED_OUTPATIENT_CLINIC_OR_DEPARTMENT_OTHER): Payer: Commercial Managed Care - PPO

## 2016-07-03 VITALS — BP 139/90 | HR 92 | Temp 98.8°F | Resp 18

## 2016-07-03 DIAGNOSIS — I824Y1 Acute embolism and thrombosis of unspecified deep veins of right proximal lower extremity: Secondary | ICD-10-CM

## 2016-07-03 DIAGNOSIS — Z5111 Encounter for antineoplastic chemotherapy: Secondary | ICD-10-CM | POA: Diagnosis not present

## 2016-07-03 DIAGNOSIS — C50411 Malignant neoplasm of upper-outer quadrant of right female breast: Secondary | ICD-10-CM

## 2016-07-03 DIAGNOSIS — I2699 Other pulmonary embolism without acute cor pulmonale: Secondary | ICD-10-CM

## 2016-07-03 DIAGNOSIS — C50919 Malignant neoplasm of unspecified site of unspecified female breast: Secondary | ICD-10-CM

## 2016-07-03 MED ORDER — GOSERELIN ACETATE 10.8 MG ~~LOC~~ IMPL
10.8000 mg | DRUG_IMPLANT | Freq: Once | SUBCUTANEOUS | Status: DC
Start: 2016-07-03 — End: 2016-07-03
  Administered 2016-07-03: 10.8 mg via SUBCUTANEOUS

## 2016-07-03 NOTE — Patient Instructions (Signed)
Goserelin injection What is this medicine? GOSERELIN (GOE se rel in) is similar to a hormone found in the body. It lowers the amount of sex hormones that the body makes. Men will have lower testosterone levels and women will have lower estrogen levels while taking this medicine. In men, this medicine is used to treat prostate cancer; the injection is either given once per month or once every 12 weeks. A once per month injection (only) is used to treat women with endometriosis, dysfunctional uterine bleeding, or advanced breast cancer. This medicine may be used for other purposes; ask your health care provider or pharmacist if you have questions. What should I tell my health care provider before I take this medicine? They need to know if you have any of these conditions (some only apply to women): -diabetes -heart disease or previous heart attack -high blood pressure -high cholesterol -kidney disease -osteoporosis or low bone density -problems passing urine -spinal cord injury -stroke -tobacco smoker -an unusual or allergic reaction to goserelin, hormone therapy, other medicines, foods, dyes, or preservatives -pregnant or trying to get pregnant -breast-feeding How should I use this medicine? This medicine is for injection under the skin. It is given by a health care professional in a hospital or clinic setting. Men receive this injection once every 4 weeks or once every 12 weeks. Women will only receive the once every 4 weeks injection. Talk to your pediatrician regarding the use of this medicine in children. Special care may be needed. Overdosage: If you think you have taken too much of this medicine contact a poison control center or emergency room at once. NOTE: This medicine is only for you. Do not share this medicine with others. What if I miss a dose? It is important not to miss your dose. Call your doctor or health care professional if you are unable to keep an appointment. What may  interact with this medicine? -female hormones like estrogen -herbal or dietary supplements like black cohosh, chasteberry, or DHEA -female hormones like testosterone -prasterone This list may not describe all possible interactions. Give your health care provider a list of all the medicines, herbs, non-prescription drugs, or dietary supplements you use. Also tell them if you smoke, drink alcohol, or use illegal drugs. Some items may interact with your medicine. What should I watch for while using this medicine? Visit your doctor or health care professional for regular checks on your progress. Your symptoms may appear to get worse during the first weeks of this therapy. Tell your doctor or healthcare professional if your symptoms do not start to get better or if they get worse after this time. Your bones may get weaker if you take this medicine for a long time. If you smoke or frequently drink alcohol you may increase your risk of bone loss. A family history of osteoporosis, chronic use of drugs for seizures (convulsions), or corticosteroids can also increase your risk of bone loss. Talk to your doctor about how to keep your bones strong. This medicine should stop regular monthly menstration in women. Tell your doctor if you continue to menstrate. Women should not become pregnant while taking this medicine or for 12 weeks after stopping this medicine. Women should inform their doctor if they wish to become pregnant or think they might be pregnant. There is a potential for serious side effects to an unborn child. Talk to your health care professional or pharmacist for more information. Do not breast-feed an infant while taking this medicine. Men should   inform their doctors if they wish to father a child. This medicine may lower sperm counts. Talk to your health care professional or pharmacist for more information. What side effects may I notice from receiving this medicine? Side effects that you should  report to your doctor or health care professional as soon as possible: -allergic reactions like skin rash, itching or hives, swelling of the face, lips, or tongue -bone pain -breathing problems -changes in vision -chest pain -feeling faint or lightheaded, falls -fever, chills -pain, swelling, warmth in the leg -pain, tingling, numbness in the hands or feet -signs and symptoms of low blood pressure like dizziness; feeling faint or lightheaded, falls; unusually weak or tired -stomach pain -swelling of the ankles, feet, hands -trouble passing urine or change in the amount of urine -unusually high or low blood pressure -unusually weak or tired Side effects that usually do not require medical attention (report to your doctor or health care professional if they continue or are bothersome): -change in sex drive or performance -changes in breast size in both males and females -changes in emotions or moods -headache -hot flashes -irritation at site where injected -loss of appetite -skin problems like acne, dry skin -vaginal dryness This list may not describe all possible side effects. Call your doctor for medical advice about side effects. You may report side effects to FDA at 1-800-FDA-1088. Where should I keep my medicine? This drug is given in a hospital or clinic and will not be stored at home. NOTE: This sheet is a summary. It may not cover all possible information. If you have questions about this medicine, talk to your doctor, pharmacist, or health care provider.    2016, Elsevier/Gold Standard. (2014-02-15 11:10:35)  

## 2016-07-22 ENCOUNTER — Telehealth: Payer: Self-pay | Admitting: *Deleted

## 2016-07-22 NOTE — Telephone Encounter (Signed)
Patient walked in to the office requesting samples of Xarelto 20mg .  I gave the pt a 15-day supply of Xarelto 20mg .

## 2016-08-12 ENCOUNTER — Ambulatory Visit: Payer: Commercial Managed Care - PPO | Admitting: Family Medicine

## 2016-08-12 ENCOUNTER — Telehealth: Payer: Self-pay | Admitting: *Deleted

## 2016-08-12 NOTE — Telephone Encounter (Signed)
Patient came in to the office requesting samples of Xarelto 20mg .  I informed the pt we are out of samples and I asked Dr [name] what we should do to help the pt.  Per Dr [name] the pt should contact the oncologist as they recommended she be on this indefinitely and they should prescribe this for her as well.  I advised that she call their office now and also gave her information for the MAP program through the health department as they do have Xarelto there and the pt agreed.

## 2016-08-14 MED FILL — XARELTO 20 MG TABLET: 20 | 30 days supply | Qty: 30 | Fill #1

## 2016-09-12 MED FILL — XARELTO 20 MG TABLET: 20 | 30 days supply | Qty: 30 | Fill #2

## 2016-09-26 ENCOUNTER — Encounter: Payer: Self-pay | Admitting: Obstetrics and Gynecology

## 2016-09-30 ENCOUNTER — Telehealth: Payer: Self-pay | Admitting: Oncology

## 2016-09-30 NOTE — Telephone Encounter (Signed)
10/0 Appointment scheduled per patient request. The patient is overdue for an injection appointment. Last scheduled appointment was on 07/12.

## 2016-10-01 ENCOUNTER — Ambulatory Visit (HOSPITAL_BASED_OUTPATIENT_CLINIC_OR_DEPARTMENT_OTHER): Payer: Self-pay

## 2016-10-01 ENCOUNTER — Encounter: Payer: Self-pay | Admitting: Oncology

## 2016-10-01 VITALS — BP 145/90 | HR 85 | Temp 98.6°F | Resp 20

## 2016-10-01 DIAGNOSIS — Z5111 Encounter for antineoplastic chemotherapy: Secondary | ICD-10-CM

## 2016-10-01 DIAGNOSIS — C50411 Malignant neoplasm of upper-outer quadrant of right female breast: Secondary | ICD-10-CM

## 2016-10-01 MED ORDER — GOSERELIN ACETATE 10.8 MG ~~LOC~~ IMPL
10.8000 mg | DRUG_IMPLANT | Freq: Once | SUBCUTANEOUS | Status: AC
  Administered 2016-10-01: 10.8 mg via SUBCUTANEOUS
  Filled 2016-10-01: qty 10.8

## 2016-10-01 NOTE — Progress Notes (Signed)
Rcvd msg that pt is uninsured.  Researched and found assistance w/ [employer] for Zoladex.  Called pt to see if she would like to apply, she gave consent.  I completed the demographics and left form for Dr. [name] signature as well as emailed her portion to her for completion and signature.  Once received I will fax to the company for processing.  I also noticed she wasn't getting the 55% self-pay discount so I emailed Nicole in billing to apply the adjustment.

## 2016-10-01 NOTE — Patient Instructions (Signed)
Goserelin injection What is this medicine? GOSERELIN (GOE se rel in) is similar to a hormone found in the body. It lowers the amount of sex hormones that the body makes. Men will have lower testosterone levels and women will have lower estrogen levels while taking this medicine. In men, this medicine is used to treat prostate cancer; the injection is either given once per month or once every 12 weeks. A once per month injection (only) is used to treat women with endometriosis, dysfunctional uterine bleeding, or advanced breast cancer. This medicine may be used for other purposes; ask your health care provider or pharmacist if you have questions. What should I tell my health care provider before I take this medicine? They need to know if you have any of these conditions (some only apply to women): -diabetes -heart disease or previous heart attack -high blood pressure -high cholesterol -kidney disease -osteoporosis or low bone density -problems passing urine -spinal cord injury -stroke -tobacco smoker -an unusual or allergic reaction to goserelin, hormone therapy, other medicines, foods, dyes, or preservatives -pregnant or trying to get pregnant -breast-feeding How should I use this medicine? This medicine is for injection under the skin. It is given by a health care professional in a hospital or clinic setting. Men receive this injection once every 4 weeks or once every 12 weeks. Women will only receive the once every 4 weeks injection. Talk to your pediatrician regarding the use of this medicine in children. Special care may be needed. Overdosage: If you think you have taken too much of this medicine contact a poison control center or emergency room at once. NOTE: This medicine is only for you. Do not share this medicine with others. What if I miss a dose? It is important not to miss your dose. Call your doctor or health care professional if you are unable to keep an appointment. What may  interact with this medicine? -female hormones like estrogen -herbal or dietary supplements like black cohosh, chasteberry, or DHEA -female hormones like testosterone -prasterone This list may not describe all possible interactions. Give your health care provider a list of all the medicines, herbs, non-prescription drugs, or dietary supplements you use. Also tell them if you smoke, drink alcohol, or use illegal drugs. Some items may interact with your medicine. What should I watch for while using this medicine? Visit your doctor or health care professional for regular checks on your progress. Your symptoms may appear to get worse during the first weeks of this therapy. Tell your doctor or healthcare professional if your symptoms do not start to get better or if they get worse after this time. Your bones may get weaker if you take this medicine for a long time. If you smoke or frequently drink alcohol you may increase your risk of bone loss. A family history of osteoporosis, chronic use of drugs for seizures (convulsions), or corticosteroids can also increase your risk of bone loss. Talk to your doctor about how to keep your bones strong. This medicine should stop regular monthly menstration in women. Tell your doctor if you continue to menstrate. Women should not become pregnant while taking this medicine or for 12 weeks after stopping this medicine. Women should inform their doctor if they wish to become pregnant or think they might be pregnant. There is a potential for serious side effects to an unborn child. Talk to your health care professional or pharmacist for more information. Do not breast-feed an infant while taking this medicine. Men should   inform their doctors if they wish to father a child. This medicine may lower sperm counts. Talk to your health care professional or pharmacist for more information. What side effects may I notice from receiving this medicine? Side effects that you should  report to your doctor or health care professional as soon as possible: -allergic reactions like skin rash, itching or hives, swelling of the face, lips, or tongue -bone pain -breathing problems -changes in vision -chest pain -feeling faint or lightheaded, falls -fever, chills -pain, swelling, warmth in the leg -pain, tingling, numbness in the hands or feet -signs and symptoms of low blood pressure like dizziness; feeling faint or lightheaded, falls; unusually weak or tired -stomach pain -swelling of the ankles, feet, hands -trouble passing urine or change in the amount of urine -unusually high or low blood pressure -unusually weak or tired Side effects that usually do not require medical attention (report to your doctor or health care professional if they continue or are bothersome): -change in sex drive or performance -changes in breast size in both males and females -changes in emotions or moods -headache -hot flashes -irritation at site where injected -loss of appetite -skin problems like acne, dry skin -vaginal dryness This list may not describe all possible side effects. Call your doctor for medical advice about side effects. You may report side effects to FDA at 1-800-FDA-1088. Where should I keep my medicine? This drug is given in a hospital or clinic and will not be stored at home. NOTE: This sheet is a summary. It may not cover all possible information. If you have questions about this medicine, talk to your doctor, pharmacist, or health care provider.    2016, Elsevier/Gold Standard. (2014-02-15 11:10:35)  

## 2016-10-14 MED FILL — XARELTO 20 MG TABLET: 20 | 30 days supply | Qty: 30 | Fill #3

## 2016-11-07 MED FILL — METOPROLOL TARTRATE 25 MG T: 25 | 90 days supply | Qty: 180 | Fill #2

## 2016-11-08 ENCOUNTER — Other Ambulatory Visit: Payer: Self-pay | Admitting: *Deleted

## 2016-11-08 DIAGNOSIS — C50411 Malignant neoplasm of upper-outer quadrant of right female breast: Secondary | ICD-10-CM

## 2016-11-08 MED FILL — XARELTO 20 MG TABLET: 20 | 30 days supply | Qty: 30 | Fill #4

## 2016-11-11 ENCOUNTER — Encounter: Payer: Self-pay | Admitting: Oncology

## 2016-11-11 ENCOUNTER — Other Ambulatory Visit: Payer: Commercial Managed Care - PPO

## 2016-11-11 ENCOUNTER — Ambulatory Visit: Payer: Commercial Managed Care - PPO | Admitting: Oncology

## 2016-11-20 ENCOUNTER — Encounter: Payer: Self-pay | Admitting: Oncology

## 2016-11-20 NOTE — Progress Notes (Signed)
Contacted the pt to see if she was still interested in applying for assistance for Zoladex since she never sent me her portion of the application.  She never responded.  The application has been shredded.

## 2016-12-10 MED FILL — XARELTO 20 MG TABLET: 20 | 30 days supply | Qty: 30 | Fill #5

## 2016-12-24 ENCOUNTER — Ambulatory Visit: Payer: Self-pay

## 2016-12-25 ENCOUNTER — Ambulatory Visit (HOSPITAL_BASED_OUTPATIENT_CLINIC_OR_DEPARTMENT_OTHER): Payer: Self-pay

## 2016-12-25 VITALS — BP 152/93 | HR 115 | Temp 98.2°F | Resp 20

## 2016-12-25 DIAGNOSIS — Z5111 Encounter for antineoplastic chemotherapy: Secondary | ICD-10-CM

## 2016-12-25 DIAGNOSIS — C50411 Malignant neoplasm of upper-outer quadrant of right female breast: Secondary | ICD-10-CM

## 2016-12-25 MED ORDER — GOSERELIN ACETATE 10.8 MG ~~LOC~~ IMPL
10.8000 mg | DRUG_IMPLANT | Freq: Once | SUBCUTANEOUS | Status: AC
  Administered 2016-12-25: 10.8 mg via SUBCUTANEOUS
  Filled 2016-12-25: qty 10.8

## 2017-01-10 ENCOUNTER — Other Ambulatory Visit: Payer: Self-pay | Admitting: Family Medicine

## 2017-01-10 MED FILL — XARELTO 20 MG TABLET: 20 | 30 days supply | Qty: 30 | Fill #0

## 2017-02-05 ENCOUNTER — Telehealth: Payer: Self-pay | Admitting: Oncology

## 2017-02-05 NOTE — Telephone Encounter (Signed)
Pt called to r/s missed lab/ov appts. Gave pt appt 2/15 at 230 pm per request

## 2017-02-06 ENCOUNTER — Other Ambulatory Visit (HOSPITAL_BASED_OUTPATIENT_CLINIC_OR_DEPARTMENT_OTHER): Payer: Self-pay

## 2017-02-06 ENCOUNTER — Ambulatory Visit (HOSPITAL_BASED_OUTPATIENT_CLINIC_OR_DEPARTMENT_OTHER): Payer: Self-pay | Admitting: Oncology

## 2017-02-06 VITALS — BP 160/97 | HR 94 | Temp 98.8°F | Resp 18 | Ht 68.5 in | Wt 182.7 lb

## 2017-02-06 DIAGNOSIS — Z7901 Long term (current) use of anticoagulants: Secondary | ICD-10-CM

## 2017-02-06 DIAGNOSIS — D6862 Lupus anticoagulant syndrome: Secondary | ICD-10-CM | POA: Insufficient documentation

## 2017-02-06 DIAGNOSIS — Z86718 Personal history of other venous thrombosis and embolism: Secondary | ICD-10-CM

## 2017-02-06 DIAGNOSIS — C50411 Malignant neoplasm of upper-outer quadrant of right female breast: Secondary | ICD-10-CM

## 2017-02-06 DIAGNOSIS — Z170 Estrogen receptor positive status [ER+]: Secondary | ICD-10-CM

## 2017-02-06 DIAGNOSIS — I82401 Acute embolism and thrombosis of unspecified deep veins of right lower extremity: Secondary | ICD-10-CM

## 2017-02-06 LAB — COMPREHENSIVE METABOLIC PANEL
ALBUMIN: 3.9 g/dL (ref 3.5–5.0)
ALK PHOS: 138 U/L (ref 40–150)
ALT: 14 U/L (ref 0–55)
ANION GAP: 10 meq/L (ref 3–11)
AST: 17 U/L (ref 5–34)
BUN: 10.9 mg/dL (ref 7.0–26.0)
CALCIUM: 10 mg/dL (ref 8.4–10.4)
CHLORIDE: 106 meq/L (ref 98–109)
CO2: 27 mEq/L (ref 22–29)
Creatinine: 0.8 mg/dL (ref 0.6–1.1)
EGFR: >90 mL/min/{1.73_m2} (ref >90)
Glucose: 105 mg/dl (ref 70–140)
POTASSIUM: 3.7 meq/L (ref 3.5–5.1)
Sodium: 142 mEq/L (ref 136–145)
Total Bilirubin: 0.44 mg/dL (ref 0.20–1.20)
Total Protein: 7.8 g/dL (ref 6.4–8.3)

## 2017-02-06 LAB — CBC WITH DIFFERENTIAL/PLATELET
BASO%: 0.3 % (ref 0.0–2.0)
BASOS ABS: 0 10*3/uL (ref 0.0–0.1)
EOS ABS: 0 10*3/uL (ref 0.0–0.5)
EOS%: 0.5 % (ref 0.0–7.0)
HEMATOCRIT: 42.6 % (ref 34.8–46.6)
HEMOGLOBIN: 14.4 g/dL (ref 11.6–15.9)
LYMPH#: 2.1 10*3/uL (ref 0.9–3.3)
LYMPH%: 34.9 % (ref 14.0–49.7)
MCH: 34.1 pg — AB (ref 25.1–34.0)
MCHC: 33.8 g/dL (ref 31.5–36.0)
MCV: 100.9 fL (ref 79.5–101.0)
MONO#: 0.5 10*3/uL (ref 0.1–0.9)
MONO%: 8.8 % (ref 0.0–14.0)
NEUT#: 3.3 10*3/uL (ref 1.5–6.5)
NEUT%: 55.5 % (ref 38.4–76.8)
PLATELETS: 194 10*3/uL (ref 145–400)
RBC: 4.22 10*6/uL (ref 3.70–5.45)
RDW: 12.3 % (ref 11.2–14.5)
WBC: 6 10*3/uL (ref 3.9–10.3)

## 2017-02-06 MED ORDER — RIVAROXABAN 20 MG PO TABS: ORAL_TABLET | ORAL | 4 refills | Status: DC

## 2017-02-06 NOTE — Progress Notes (Signed)
ID: Jenna Gonzales   DOB: 1971/07/23  MR#: [id number]  PPJ#:[id number]  PCP: Lucretia Kern., DO GYN: [name], MD SU: Excell Seltzer, MD OTHER MD: Sumner Boast MD  CHIEF COMPLAINTS:  1) Hx of Right Breast Cancer      2) DVT/PE   CURRENT THERAPY: goserelin injections  BREAST CANCER HISTORY: From the original intake note:  The patient saw Dr. Garwin Brothers for a routine gynecologic evaluation, and Dr. Garwin Brothers did a breast exam which showed a palpable mass in the right breast.  She was referred for a diagnostic mammography 11/03/2009, and Dr. Claudie Revering was able to confirm a palpable mass in the 10 o'clock position of the right breast 7 cm from the nipple.  There was no palpable adenopathy.  By mammography the breasts were dense, but there was a spiculated mass in the upper outer right breast corresponding to the palpable mass by ultrasound as measured 2.1 cm.  It was hypoechoic, irregular, and poorly defined.  Ultrasound of the right axilla demonstrated several normal sized lymph nodes, several of them showing cortical thickening.    With this information, the patient was brought back on 11/19 for biopsy.  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  The lymph node biopsy obtained at the same time was negative.  The tumor was ER positive at 99%, PR positive at 20% with a low proliferation marker at 12%, and HER-2/neu was not amplified with a ratio of 1.45.  Bilateral breast MRIs were obtained 11/22.  This confirmed a 2.2 cm irregularly marginated enhancing mass in the upper outer right breast with a clip in the mass.  There were no other masses, and aside from post biopsy changes in the right axilla, there were no abnormal appearing lymph nodes.    The patient proceeded to right lumpectomy and sentinel lymph node sampling 12/09 under Dr. Excell Seltzer because the two sentinel lymph nodes provided positive for cancer.  Dr. Excell Seltzer  proceeded to a right axillary lymph node dissection in the same procedure.  The final pathology [id number]) showed an invasive ductal carcinoma, grade 1, with negative although close margins (the closest was 1 mm posteriorly), with evidence of lymphovascular invasion and involving 2 out of 17 lymph nodes sampled (the 2 sentinel lymph nodes).   Her subsequent history is as detailed below.  INTERVAL HISTORY: Jenna Gonzales returns today for follow-up of her breast cancer history as well as for follow-up of her hypercoagulable state. She has been on apixaban since her right lower extremity DVT in May 2017. She tolerates it well and there has been no overt bleeding  As far as breast cancer is concerned she is currently behind on her mammography because she has no insurance.  REVIEW OF SYSTEMS: Jenna Gonzales is not exercising regularly. Her work at [employer] is mostly sedentary. She has felt a little bit itchy in the last couple of weeks and tells me she recently changed her laundry powder. She has gone back to the old one but some of her close are still there wants washed with a new product. She has had no problems with jaundice and no adenopathy. A detailed review of systems today was otherwise stable.  PAST MEDICAL HISTORY: Past Medical History:  Diagnosis Date  . Abnormal uterine bleeding (AUB)   . Allergic rhinitis   . Anxiety   . Borderline hypertension   . Endometrial polyp   . History of breast cancer oncologist-  dr Ron Agee-- no recurrence  dx 11/ 2010  s/p  right lumpectomy w/ snl dissection 11-30-2009/  Stage 2B, DCIS, grade 1, (T2 N1),  ER/PR+,  HER2 negative/  chemo completed May 2011 and radiation completed July 2011 and completed anastrozole July 2016  . History of cancer chemotherapy    right breast-- complete 04/2010  . History of DVT of lower extremity    09-22-2013  right lower extremitiy (femoral, popliteal, posterior tibial)  . History of pulmonary embolus (PE)    09-22-2013  .  History of radiation therapy    right breast -- complete 07/ 2011  . Osteopenia   . Wears contact lenses   Mild GERD, history of a heart murmur, which was not appreciated today, history of borderline hypertension not under treatment, history of irritable bowel disease, and history of minimal arthritic changes involving particularly the index finger of the right hand.  PAST SURGICAL HISTORY: Past Surgical History:  Procedure Laterality Date  . BREAST LUMPECTOMY WITH AXILLARY LYMPH NODE DISSECTION Right 11-30-2009  . cryosurgery cervix     2012  . DILATATION & CURETTAGE/HYSTEROSCOPY WITH MYOSURE N/A 04/29/2016   Procedure: DILATATION & CURETTAGE/POLYPECTOMY/HYSTEROSCOPY WITH MYOSURE;  Surgeon: [name], MD;  Location: [facility];  Service: Gynecology;  Laterality: N/A;  multiple endometrial polyps  . PORT-A-CATH PLACEMENT /  REMOVAL  01-10-2010/  removal 05-09-2010    FAMILY HISTORY Family History  Problem Relation Age of Onset  . Leukemia Father   . Arthritis    . Heart disease    . Hypertension    . Kidney disease    . Diabetes    . Diabetes Other   The patient's father died from leukemia at the age of 57 (I cannot tell from the history if this was chronic or acute).  The patient's mother is alive at age 7.  The patient has 2 sisters and 1 brother, all here in [place].  She also has 2 half brothers living in [place].  There is no history of breast or ovarian cancer in the family.  GYNECOLOGIC HISTORY: (Updated 02/24/2014) She is GX, P1.  First pregnancy to term age 37.  Last menstrual period was in March 2011 with the initiation of chemotherapy. Hormone levels drawn in October 2014 showed patient to  be premenopausal.   SOCIAL HISTORY:    (Updated 02/24/2014) The patient previously worked at [employer] in [employer].  She worked for the [employer] for 8 years. 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 She is single. Her son Dahrahn lives with her.  He "could never finish school," and does not have a job at present.  He does have 2 daughters who live with their mother here in [place].  The patient attends [employer].    ADVANCED DIRECTIVES: Not in place  HEALTH MAINTENANCE: (Updated 02/24/2014) Social History  Substance Use Topics  . Smoking status: Never Smoker  . Smokeless tobacco: Never Used  . Alcohol use 0.0 oz/week     Comment: socially     Colonoscopy: Never  PAP: UTD/Dr. Garwin Brothers  Bone density: Never/Scheduled for 03/11/2014  Lipid panel: Not on file   Allergies  Allergen Reactions  . Tamoxifen Other (See Comments)    Blood clots    Current Outpatient Prescriptions  Medication Sig Dispense Refill  . Loratadine 10 MG CAPS Take 1 capsule (10 mg total) by mouth as needed (allergies). 30 each   .  metoprolol tartrate (LOPRESSOR) 25 MG tablet Take 1 tablet (25 mg total) by mouth 2 (two) times daily. 180 tablet 3  . XARELTO 20 MG TABS tablet TAKE 1 TABLET BY MOUTH ONCE DAILY WITH SUPPER 90 tablet 1   No current facility-administered medications for this visit.    Facility-Administered Medications Ordered in Other Visits  Medication Dose Route Frequency Provider Last Rate Last Dose  . goserelin (ZOLADEX) injection 10.8 mg  10.8 mg Subcutaneous Q90 days Laurie Panda, NP   10.8 mg at 10/25/15 1625    OBJECTIVE: Young African American womanWho appears stated age 46:   02/06/17 1459  BP: (!) 160/97  Pulse: 94  Resp: 18  Temp: 98.8 F (37.1 C)     Body mass index is 27.38 kg/m.    ECOG FS: 1 Filed Weights   02/06/17 1459  Weight: 182 lb 11.2 oz (82.9 kg)   Sclerae unicteric, pupils round and equal Oropharynx clear and moist-- no thrush or other lesions No cervical or supraclavicular adenopathy Lungs no rales or rhonchi Heart regular rate and rhythm Abd soft, nontender, positive bowel sounds MSK no  focal spinal tenderness, no upper extremity lymphedema Neuro: nonfocal, well oriented, appropriate affect Breasts: The right breast is status post lumpectomy followed by radiation with no evidence of local recurrence. The right axilla is benign. The left breast is unremarkable.    LAB RESULTS: Lab Results  Component Value Date   WBC 6.0 02/06/2017   NEUTROABS 3.3 02/06/2017   HGB 14.4 02/06/2017   HCT 42.6 02/06/2017   MCV 100.9 02/06/2017   PLT 194 02/06/2017      Chemistry      Component Value Date/Time   NA 142 02/06/2017 1432   K 3.7 02/06/2017 1432   CL 105 05/06/2016 1320   CL 106 01/19/2013 1507   CO2 27 02/06/2017 1432   BUN 10.9 02/06/2017 1432   CREATININE 0.8 02/06/2017 1432      Component Value Date/Time   CALCIUM 10.0 02/06/2017 1432   ALKPHOS 138 02/06/2017 1432   AST 17 02/06/2017 1432   ALT 14 02/06/2017 1432   BILITOT 0.44 02/06/2017 1432       STUDIES:  No results found.   ASSESSMENT: 46 y.o.  [place] woman   (1)  status post right breast upper outer quadrant lumpectomy and axillary lymph node dissection December of 2010 for a T2 N1, stage IIB invasive ductal carcinoma, grade 1, strongly estrogen receptor positive, moderately progesterone receptor positive, HER-2 negative, with a low proliferation fraction;   (2)  treated adjuvantly with 6 cycles of docetaxel/ doxorubicin/ cyclophosphamide, completed in May of 2011,   (3)   followed by radiation completed July of 2011 at which time she started tamoxifen.  (4)  tamoxifen was discontinued in October 2014 when patient was diagnosed with a right lower extremity DVT and pulmonary embolism.   (a) anti-coagulation therapy with Xarelto, beginning 09/23/2013, completed 03/23/2014  (b) repeat Doppler ultrasonography 06/09/2014 finds no evidence of residual clot  (c) second acute right lower extremity DVT documented 05/06/2016 following uterine polypectomy  (d) hypercoagulable panel 05/27/2016  documents a lupus anticoagulant  (e) xarelto started May 2017, to be continued indefinitely  (5)  labs drawn in October 2014 confirmed a premenopausal status. Patient received her first Q3 month injection of goserelin on 12/02/2013, and then began on anastrozole at 1 mg daily.  (7) completed 5 years of anastrozole as of July 2016. Goserelin continued at patient's request  PLAN: Jenna Gonzales Is now7 years out from definitive surgery for her breast cancer with no evidence of disease recurrence. This is very favorable.  She is behind on her mammography because she has no insurance. I will ask our navigators if they can sign her up for a possible scholarship  She was encouraged to apply for assistance for the goserelin shots in one of our financial advisors fill out the form for her but Jenna Gonzales never brought back the information and that was dropped. I encouraged her to pursue this again.  She would like to get off the apixaban. 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 I have refilled her apixaban and I will set her up for lab work 03/18/2017, when she returns for her next goserelin shot, to see if the lupus anticoagulant has resolved as sometimes occurs  Otherwise we are continuing the goserelin every 3 months at her request. She will return to see me in a year. She knows to call for any other problems that may develop before that visit.  Chauncey Cruel, MD   02/06/2017

## 2017-02-07 ENCOUNTER — Encounter: Payer: Self-pay | Admitting: Oncology

## 2017-02-07 MED FILL — XARELTO 20 MG TABLET: 20 | 30 days supply | Qty: 30 | Fill #0

## 2017-02-07 NOTE — Progress Notes (Signed)
Dr. Jana Hakim reached out to see if I can get assistance for pt's injection.  Pt doesn't have a way to get the signed app or needed docs back to me so I suggested to her that she call [employer] and apply directly with them over the phone.  If she has any questions she has my contact info to reach me.

## 2017-02-12 ENCOUNTER — Telehealth: Payer: Self-pay | Admitting: Cardiovascular Disease

## 2017-02-12 NOTE — Telephone Encounter (Signed)
Patient called back. Informed patient that Dr. Johnsie Cancel advised in her May office visit to stop taking Zyrtec and take Claritin instead. Patient verbalized understanding and had no other questions at this time.

## 2017-02-12 NOTE — Telephone Encounter (Signed)
New Message:   Pt wants to know what allergy medicine she can take along with her other medicine she is on?

## 2017-02-12 NOTE — Telephone Encounter (Signed)
Left message for patient to call back  

## 2017-03-14 MED FILL — XARELTO 20 MG TABLET: 20 | 30 days supply | Qty: 30 | Fill #1

## 2017-03-18 ENCOUNTER — Ambulatory Visit (HOSPITAL_BASED_OUTPATIENT_CLINIC_OR_DEPARTMENT_OTHER): Payer: Self-pay

## 2017-03-18 ENCOUNTER — Other Ambulatory Visit (HOSPITAL_BASED_OUTPATIENT_CLINIC_OR_DEPARTMENT_OTHER): Payer: Self-pay

## 2017-03-18 VITALS — BP 149/101 | HR 94 | Temp 98.0°F | Resp 18

## 2017-03-18 DIAGNOSIS — Z5111 Encounter for antineoplastic chemotherapy: Secondary | ICD-10-CM

## 2017-03-18 DIAGNOSIS — C50411 Malignant neoplasm of upper-outer quadrant of right female breast: Secondary | ICD-10-CM

## 2017-03-18 DIAGNOSIS — D6862 Lupus anticoagulant syndrome: Secondary | ICD-10-CM

## 2017-03-18 DIAGNOSIS — I82401 Acute embolism and thrombosis of unspecified deep veins of right lower extremity: Secondary | ICD-10-CM

## 2017-03-18 DIAGNOSIS — Z170 Estrogen receptor positive status [ER+]: Principal | ICD-10-CM

## 2017-03-18 LAB — COMPREHENSIVE METABOLIC PANEL
ALBUMIN: 3.8 g/dL (ref 3.5–5.0)
ALK PHOS: 144 U/L (ref 40–150)
ALT: 29 U/L (ref 0–55)
AST: 26 U/L (ref 5–34)
Anion Gap: 11 mEq/L (ref 3–11)
BUN: 10.9 mg/dL (ref 7.0–26.0)
CALCIUM: 10.2 mg/dL (ref 8.4–10.4)
CO2: 26 mEq/L (ref 22–29)
CREATININE: 0.9 mg/dL (ref 0.6–1.1)
Chloride: 104 mEq/L (ref 98–109)
EGFR: >90 mL/min/{1.73_m2} (ref >90)
GLUCOSE: 95 mg/dL (ref 70–140)
Potassium: 4.5 mEq/L (ref 3.5–5.1)
SODIUM: 141 meq/L (ref 136–145)
TOTAL PROTEIN: 7.8 g/dL (ref 6.4–8.3)
Total Bilirubin: 0.43 mg/dL (ref 0.20–1.20)

## 2017-03-18 LAB — CBC WITH DIFFERENTIAL/PLATELET
BASO%: 0.5 % (ref 0.0–2.0)
Basophils Absolute: 0 10*3/uL (ref 0.0–0.1)
EOS ABS: 0 10*3/uL (ref 0.0–0.5)
EOS%: 0.5 % (ref 0.0–7.0)
HEMATOCRIT: 43.1 % (ref 34.8–46.6)
HEMOGLOBIN: 14.5 g/dL (ref 11.6–15.9)
LYMPH%: 34.5 % (ref 14.0–49.7)
MCH: 34.2 pg — ABNORMAL HIGH (ref 25.1–34.0)
MCHC: 33.6 g/dL (ref 31.5–36.0)
MCV: 101.7 fL — ABNORMAL HIGH (ref 79.5–101.0)
MONO#: 0.5 10*3/uL (ref 0.1–0.9)
MONO%: 7.1 % (ref 0.0–14.0)
NEUT%: 57.4 % (ref 38.4–76.8)
NEUTROS ABS: 3.7 10*3/uL (ref 1.5–6.5)
Platelets: 215 10*3/uL (ref 145–400)
RBC: 4.24 10*6/uL (ref 3.70–5.45)
RDW: 12.4 % (ref 11.2–14.5)
WBC: 6.4 10*3/uL (ref 3.9–10.3)
lymph#: 2.2 10*3/uL (ref 0.9–3.3)

## 2017-03-18 MED ORDER — GOSERELIN ACETATE 10.8 MG ~~LOC~~ IMPL
10.8000 mg | DRUG_IMPLANT | Freq: Once | SUBCUTANEOUS | Status: AC
  Administered 2017-03-18: 10.8 mg via SUBCUTANEOUS
  Filled 2017-03-18: qty 10.8

## 2017-03-20 LAB — LUPUS ANTICOAGULANT PANEL
DRVVT CONFIRM: 1.5 ratio — AB (ref 0.8–1.2)
DRVVT MIX: 97.1 s — AB (ref 0.0–47.0)
DRVVT: 117.8 s — AB (ref 0.0–47.0)
PTT-LA: 37.9 s (ref 0.0–51.9)

## 2017-04-15 MED FILL — XARELTO 20 MG TABLET: 20 | 30 days supply | Qty: 30 | Fill #2

## 2017-05-20 ENCOUNTER — Other Ambulatory Visit: Payer: Self-pay | Admitting: Cardiovascular Disease

## 2017-05-20 MED FILL — XARELTO 20 MG TABLET: 20 | 30 days supply | Qty: 30 | Fill #3

## 2017-05-21 MED FILL — METOPROLOL TARTRATE 25 MG T: 25 | 30 days supply | Qty: 60 | Fill #0

## 2017-06-10 ENCOUNTER — Other Ambulatory Visit: Payer: Self-pay

## 2017-06-10 ENCOUNTER — Ambulatory Visit (HOSPITAL_BASED_OUTPATIENT_CLINIC_OR_DEPARTMENT_OTHER): Payer: Self-pay

## 2017-06-10 VITALS — BP 158/90 | HR 95 | Temp 98.0°F | Resp 20

## 2017-06-10 DIAGNOSIS — Z5111 Encounter for antineoplastic chemotherapy: Secondary | ICD-10-CM

## 2017-06-10 DIAGNOSIS — C50411 Malignant neoplasm of upper-outer quadrant of right female breast: Secondary | ICD-10-CM

## 2017-06-10 MED ORDER — GOSERELIN ACETATE 10.8 MG ~~LOC~~ IMPL
10.8000 mg | DRUG_IMPLANT | Freq: Once | SUBCUTANEOUS | Status: AC
  Administered 2017-06-10: 10.8 mg via SUBCUTANEOUS
  Filled 2017-06-10: qty 10.8

## 2017-06-10 NOTE — Patient Instructions (Signed)
Goserelin injection What is this medicine? GOSERELIN (GOE se rel in) is similar to a hormone found in the body. It lowers the amount of sex hormones that the body makes. Men will have lower testosterone levels and women will have lower estrogen levels while taking this medicine. In men, this medicine is used to treat prostate cancer; the injection is either given once per month or once every 12 weeks. A once per month injection (only) is used to treat women with endometriosis, dysfunctional uterine bleeding, or advanced breast cancer. This medicine may be used for other purposes; ask your health care provider or pharmacist if you have questions. What should I tell my health care provider before I take this medicine? They need to know if you have any of these conditions (some only apply to women): -diabetes -heart disease or previous heart attack -high blood pressure -high cholesterol -kidney disease -osteoporosis or low bone density -problems passing urine -spinal cord injury -stroke -tobacco smoker -an unusual or allergic reaction to goserelin, hormone therapy, other medicines, foods, dyes, or preservatives -pregnant or trying to get pregnant -breast-feeding How should I use this medicine? This medicine is for injection under the skin. It is given by a health care professional in a hospital or clinic setting. Men receive this injection once every 4 weeks or once every 12 weeks. Women will only receive the once every 4 weeks injection. Talk to your pediatrician regarding the use of this medicine in children. Special care may be needed. Overdosage: If you think you have taken too much of this medicine contact a poison control center or emergency room at once. NOTE: This medicine is only for you. Do not share this medicine with others. What if I miss a dose? It is important not to miss your dose. Call your doctor or health care professional if you are unable to keep an appointment. What may  interact with this medicine? -female hormones like estrogen -herbal or dietary supplements like black cohosh, chasteberry, or DHEA -female hormones like testosterone -prasterone This list may not describe all possible interactions. Give your health care provider a list of all the medicines, herbs, non-prescription drugs, or dietary supplements you use. Also tell them if you smoke, drink alcohol, or use illegal drugs. Some items may interact with your medicine. What should I watch for while using this medicine? Visit your doctor or health care professional for regular checks on your progress. Your symptoms may appear to get worse during the first weeks of this therapy. Tell your doctor or healthcare professional if your symptoms do not start to get better or if they get worse after this time. Your bones may get weaker if you take this medicine for a long time. If you smoke or frequently drink alcohol you may increase your risk of bone loss. A family history of osteoporosis, chronic use of drugs for seizures (convulsions), or corticosteroids can also increase your risk of bone loss. Talk to your doctor about how to keep your bones strong. This medicine should stop regular monthly menstration in women. Tell your doctor if you continue to menstrate. Women should not become pregnant while taking this medicine or for 12 weeks after stopping this medicine. Women should inform their doctor if they wish to become pregnant or think they might be pregnant. There is a potential for serious side effects to an unborn child. Talk to your health care professional or pharmacist for more information. Do not breast-feed an infant while taking this medicine. Men should   inform their doctors if they wish to father a child. This medicine may lower sperm counts. Talk to your health care professional or pharmacist for more information. What side effects may I notice from receiving this medicine? Side effects that you should  report to your doctor or health care professional as soon as possible: -allergic reactions like skin rash, itching or hives, swelling of the face, lips, or tongue -bone pain -breathing problems -changes in vision -chest pain -feeling faint or lightheaded, falls -fever, chills -pain, swelling, warmth in the leg -pain, tingling, numbness in the hands or feet -signs and symptoms of low blood pressure like dizziness; feeling faint or lightheaded, falls; unusually weak or tired -stomach pain -swelling of the ankles, feet, hands -trouble passing urine or change in the amount of urine -unusually high or low blood pressure -unusually weak or tired Side effects that usually do not require medical attention (report to your doctor or health care professional if they continue or are bothersome): -change in sex drive or performance -changes in breast size in both males and females -changes in emotions or moods -headache -hot flashes -irritation at site where injected -loss of appetite -skin problems like acne, dry skin -vaginal dryness This list may not describe all possible side effects. Call your doctor for medical advice about side effects. You may report side effects to FDA at 1-800-FDA-1088. Where should I keep my medicine? This drug is given in a hospital or clinic and will not be stored at home. NOTE: This sheet is a summary. It may not cover all possible information. If you have questions about this medicine, talk to your doctor, pharmacist, or health care provider.    2016, Elsevier/Gold Standard. (2014-02-15 11:10:35)  

## 2017-06-10 NOTE — Progress Notes (Signed)
Supplied her own Goserelin injection for administration. From [place]

## 2017-06-19 MED FILL — XARELTO 20 MG TABLET: 20 | 30 days supply | Qty: 30 | Fill #4

## 2017-07-21 MED FILL — XARELTO 20 MG TABLET: 20 | 30 days supply | Qty: 30 | Fill #0

## 2017-08-20 MED FILL — XARELTO 20 MG TABLET: 20 | 30 days supply | Qty: 30 | Fill #1

## 2017-09-02 ENCOUNTER — Other Ambulatory Visit (HOSPITAL_BASED_OUTPATIENT_CLINIC_OR_DEPARTMENT_OTHER): Payer: Self-pay

## 2017-09-02 ENCOUNTER — Ambulatory Visit (HOSPITAL_BASED_OUTPATIENT_CLINIC_OR_DEPARTMENT_OTHER): Payer: Self-pay

## 2017-09-02 DIAGNOSIS — C50411 Malignant neoplasm of upper-outer quadrant of right female breast: Secondary | ICD-10-CM

## 2017-09-02 DIAGNOSIS — Z170 Estrogen receptor positive status [ER+]: Principal | ICD-10-CM

## 2017-09-02 DIAGNOSIS — I82401 Acute embolism and thrombosis of unspecified deep veins of right lower extremity: Secondary | ICD-10-CM

## 2017-09-02 DIAGNOSIS — Z5111 Encounter for antineoplastic chemotherapy: Secondary | ICD-10-CM

## 2017-09-02 DIAGNOSIS — D6862 Lupus anticoagulant syndrome: Secondary | ICD-10-CM

## 2017-09-02 LAB — CBC WITH DIFFERENTIAL/PLATELET
BASO%: 1.2 % (ref 0.0–2.0)
Basophils Absolute: 0.1 10*3/uL (ref 0.0–0.1)
EOS%: 0.2 % (ref 0.0–7.0)
Eosinophils Absolute: 0 10*3/uL (ref 0.0–0.5)
HEMATOCRIT: 45.8 % (ref 34.8–46.6)
HGB: 15.5 g/dL (ref 11.6–15.9)
LYMPH#: 2.2 10*3/uL (ref 0.9–3.3)
LYMPH%: 29.3 % (ref 14.0–49.7)
MCH: 33.7 pg (ref 25.1–34.0)
MCHC: 33.8 g/dL (ref 31.5–36.0)
MCV: 99.7 fL (ref 79.5–101.0)
MONO#: 0.8 10*3/uL (ref 0.1–0.9)
MONO%: 9.9 % (ref 0.0–14.0)
NEUT#: 4.5 10*3/uL (ref 1.5–6.5)
NEUT%: 59.4 % (ref 38.4–76.8)
Platelets: 231 10*3/uL (ref 145–400)
RBC: 4.6 10*6/uL (ref 3.70–5.45)
RDW: 12.7 % (ref 11.2–14.5)
WBC: 7.6 10*3/uL (ref 3.9–10.3)

## 2017-09-02 LAB — COMPREHENSIVE METABOLIC PANEL
ALBUMIN: 3.8 g/dL (ref 3.5–5.0)
ALK PHOS: 118 U/L (ref 40–150)
ALT: 16 U/L (ref 0–55)
ANION GAP: 11 meq/L (ref 3–11)
AST: 18 U/L (ref 5–34)
BILIRUBIN TOTAL: 0.41 mg/dL (ref 0.20–1.20)
BUN: 10.9 mg/dL (ref 7.0–26.0)
CO2: 24 meq/L (ref 22–29)
CREATININE: 0.9 mg/dL (ref 0.6–1.1)
Calcium: 9.8 mg/dL (ref 8.4–10.4)
Chloride: 106 mEq/L (ref 98–109)
EGFR: 87 mL/min/{1.73_m2} — AB (ref >90)
GLUCOSE: 112 mg/dL (ref 70–140)
Potassium: 3.5 mEq/L (ref 3.5–5.1)
SODIUM: 141 meq/L (ref 136–145)
TOTAL PROTEIN: 8 g/dL (ref 6.4–8.3)

## 2017-09-02 MED ORDER — GOSERELIN ACETATE 10.8 MG ~~LOC~~ IMPL
10.8000 mg | DRUG_IMPLANT | Freq: Once | SUBCUTANEOUS | Status: AC
  Administered 2017-09-02: 10.8 mg via SUBCUTANEOUS
  Filled 2017-09-02: qty 10.8

## 2017-09-22 ENCOUNTER — Other Ambulatory Visit: Payer: Self-pay | Admitting: *Deleted

## 2017-09-22 MED FILL — XARELTO 20 MG TABLET: 20 | 30 days supply | Qty: 30 | Fill #2

## 2017-10-21 MED FILL — XARELTO 20 MG TABLET: 20 | 30 days supply | Qty: 30 | Fill #3

## 2017-11-21 MED FILL — XARELTO 20 MG TABLET: 20 | 30 days supply | Qty: 30 | Fill #4

## 2017-11-25 ENCOUNTER — Other Ambulatory Visit (HOSPITAL_BASED_OUTPATIENT_CLINIC_OR_DEPARTMENT_OTHER): Payer: Self-pay

## 2017-11-25 ENCOUNTER — Ambulatory Visit (HOSPITAL_BASED_OUTPATIENT_CLINIC_OR_DEPARTMENT_OTHER): Payer: Self-pay

## 2017-11-25 VITALS — BP 145/90 | HR 85 | Temp 98.0°F | Resp 18

## 2017-11-25 DIAGNOSIS — D6862 Lupus anticoagulant syndrome: Secondary | ICD-10-CM

## 2017-11-25 DIAGNOSIS — C50411 Malignant neoplasm of upper-outer quadrant of right female breast: Secondary | ICD-10-CM

## 2017-11-25 DIAGNOSIS — Z170 Estrogen receptor positive status [ER+]: Principal | ICD-10-CM

## 2017-11-25 DIAGNOSIS — I82401 Acute embolism and thrombosis of unspecified deep veins of right lower extremity: Secondary | ICD-10-CM

## 2017-11-25 DIAGNOSIS — Z5111 Encounter for antineoplastic chemotherapy: Secondary | ICD-10-CM

## 2017-11-25 LAB — COMPREHENSIVE METABOLIC PANEL
ALT: 16 U/L (ref 0–55)
ANION GAP: 12 meq/L — AB (ref 3–11)
AST: 16 U/L (ref 5–34)
Albumin: 3.8 g/dL (ref 3.5–5.0)
Alkaline Phosphatase: 123 U/L (ref 40–150)
BILIRUBIN TOTAL: 0.37 mg/dL (ref 0.20–1.20)
BUN: 11.8 mg/dL (ref 7.0–26.0)
CHLORIDE: 106 meq/L (ref 98–109)
CO2: 23 meq/L (ref 22–29)
CREATININE: 0.9 mg/dL (ref 0.6–1.1)
Calcium: 9.7 mg/dL (ref 8.4–10.4)
EGFR: >60 mL/min/{1.73_m2} (ref >60)
GLUCOSE: 101 mg/dL (ref 70–140)
Potassium: 3.7 mEq/L (ref 3.5–5.1)
SODIUM: 141 meq/L (ref 136–145)
TOTAL PROTEIN: 8.1 g/dL (ref 6.4–8.3)

## 2017-11-25 LAB — CBC WITH DIFFERENTIAL/PLATELET
BASO%: 0.7 % (ref 0.0–2.0)
BASOS ABS: 0.1 10*3/uL (ref 0.0–0.1)
EOS ABS: 0 10*3/uL (ref 0.0–0.5)
EOS%: 0.3 % (ref 0.0–7.0)
HCT: 46.7 % — ABNORMAL HIGH (ref 34.8–46.6)
HEMOGLOBIN: 15.5 g/dL (ref 11.6–15.9)
LYMPH%: 28.2 % (ref 14.0–49.7)
MCH: 33.5 pg (ref 25.1–34.0)
MCHC: 33.3 g/dL (ref 31.5–36.0)
MCV: 100.5 fL (ref 79.5–101.0)
MONO#: 0.7 10*3/uL (ref 0.1–0.9)
MONO%: 8.8 % (ref 0.0–14.0)
NEUT#: 5.1 10*3/uL (ref 1.5–6.5)
NEUT%: 62 % (ref 38.4–76.8)
Platelets: 219 10*3/uL (ref 145–400)
RBC: 4.65 10*6/uL (ref 3.70–5.45)
RDW: 12.3 % (ref 11.2–14.5)
WBC: 8.2 10*3/uL (ref 3.9–10.3)
lymph#: 2.3 10*3/uL (ref 0.9–3.3)

## 2017-11-25 MED ORDER — GOSERELIN ACETATE 10.8 MG ~~LOC~~ IMPL
10.8000 mg | DRUG_IMPLANT | Freq: Once | SUBCUTANEOUS | Status: AC
  Administered 2017-11-25: 10.8 mg via SUBCUTANEOUS
  Filled 2017-11-25: qty 10.8

## 2017-12-22 MED FILL — XARELTO 20 MG TABLET: 20 | 30 days supply | Qty: 30 | Fill #5

## 2018-01-23 MED FILL — XARELTO 20 MG TABLET: 20 | 30 days supply | Qty: 30 | Fill #6

## 2018-02-17 ENCOUNTER — Inpatient Hospital Stay: Payer: Self-pay | Attending: Oncology

## 2018-02-17 ENCOUNTER — Inpatient Hospital Stay: Payer: Self-pay

## 2018-02-17 ENCOUNTER — Ambulatory Visit: Payer: Self-pay | Admitting: Oncology

## 2018-02-17 VITALS — BP 160/94 | HR 110 | Temp 97.8°F | Resp 18

## 2018-02-17 DIAGNOSIS — C50411 Malignant neoplasm of upper-outer quadrant of right female breast: Secondary | ICD-10-CM

## 2018-02-17 DIAGNOSIS — Z5111 Encounter for antineoplastic chemotherapy: Secondary | ICD-10-CM | POA: Insufficient documentation

## 2018-02-17 DIAGNOSIS — Z170 Estrogen receptor positive status [ER+]: Principal | ICD-10-CM

## 2018-02-17 DIAGNOSIS — D6862 Lupus anticoagulant syndrome: Secondary | ICD-10-CM

## 2018-02-17 DIAGNOSIS — I82401 Acute embolism and thrombosis of unspecified deep veins of right lower extremity: Secondary | ICD-10-CM

## 2018-02-17 LAB — CBC WITH DIFFERENTIAL/PLATELET
BASOS ABS: 0 10*3/uL (ref 0.0–0.1)
Basophils Relative: 0 %
Eosinophils Absolute: 0 10*3/uL (ref 0.0–0.5)
Eosinophils Relative: 1 %
HEMATOCRIT: 45.1 % (ref 34.8–46.6)
Hemoglobin: 15 g/dL (ref 11.6–15.9)
LYMPHS PCT: 39 %
Lymphs Abs: 2.9 10*3/uL (ref 0.9–3.3)
MCH: 34.1 pg — ABNORMAL HIGH (ref 25.1–34.0)
MCHC: 33.3 g/dL (ref 31.5–36.0)
MCV: 102.5 fL — ABNORMAL HIGH (ref 79.5–101.0)
Monocytes Absolute: 0.6 10*3/uL (ref 0.1–0.9)
Monocytes Relative: 8 %
NEUTROS ABS: 3.9 10*3/uL (ref 1.5–6.5)
NEUTROS PCT: 52 %
PLATELETS: 233 10*3/uL (ref 145–400)
RBC: 4.4 MIL/uL (ref 3.70–5.45)
RDW: 12.4 % (ref 11.2–14.5)
WBC: 7.4 10*3/uL (ref 3.9–10.3)

## 2018-02-17 LAB — COMPREHENSIVE METABOLIC PANEL
ALT: 23 U/L (ref 0–55)
ANION GAP: 13 — AB (ref 3–11)
AST: 19 U/L (ref 5–34)
Albumin: 4 g/dL (ref 3.5–5.0)
Alkaline Phosphatase: 137 U/L (ref 40–150)
BUN: 10 mg/dL (ref 7–26)
CO2: 23 mmol/L (ref 22–29)
Calcium: 10 mg/dL (ref 8.4–10.4)
Chloride: 106 mmol/L (ref 98–109)
Creatinine, Ser: 0.86 mg/dL (ref 0.60–1.10)
GFR calc Af Amer: >60 mL/min (ref >60)
Glucose, Bld: 101 mg/dL (ref 70–140)
POTASSIUM: 3.5 mmol/L (ref 3.5–5.1)
Sodium: 142 mmol/L (ref 136–145)
TOTAL PROTEIN: 8.4 g/dL — AB (ref 6.4–8.3)
Total Bilirubin: 0.4 mg/dL (ref 0.2–1.2)

## 2018-02-17 MED ORDER — GOSERELIN ACETATE 10.8 MG ~~LOC~~ IMPL
10.8000 mg | DRUG_IMPLANT | Freq: Once | SUBCUTANEOUS | Status: AC
  Administered 2018-02-17: 10.8 mg via SUBCUTANEOUS
  Filled 2018-02-17: qty 10.8

## 2018-02-24 ENCOUNTER — Other Ambulatory Visit: Payer: Self-pay | Admitting: Oncology

## 2018-02-25 MED FILL — XARELTO 20 MG TABLET: 20 | 30 days supply | Qty: 30 | Fill #0

## 2018-03-07 NOTE — Progress Notes (Signed)
No show

## 2018-03-09 ENCOUNTER — Ambulatory Visit: Payer: Self-pay | Admitting: Oncology

## 2018-03-09 ENCOUNTER — Encounter: Payer: Self-pay | Admitting: Oncology

## 2018-03-09 DIAGNOSIS — D689 Coagulation defect, unspecified: Secondary | ICD-10-CM | POA: Insufficient documentation

## 2018-03-27 ENCOUNTER — Other Ambulatory Visit: Payer: Self-pay | Admitting: Oncology

## 2018-03-27 MED FILL — XARELTO 20 MG TABLET: 20 | 30 days supply | Qty: 30 | Fill #0

## 2018-04-20 ENCOUNTER — Telehealth: Payer: Self-pay | Admitting: Oncology

## 2018-04-20 NOTE — Telephone Encounter (Signed)
Patient called to schedule  °

## 2018-04-28 ENCOUNTER — Other Ambulatory Visit: Payer: Self-pay | Admitting: Oncology

## 2018-04-29 MED FILL — XARELTO 20 MG TABLET: 20 | 30 days supply | Qty: 30 | Fill #0

## 2018-05-12 ENCOUNTER — Inpatient Hospital Stay: Payer: Self-pay | Attending: Oncology

## 2018-05-12 ENCOUNTER — Encounter: Payer: Self-pay | Admitting: Pharmacy Technician

## 2018-05-12 VITALS — BP 150/90 | HR 98 | Temp 97.9°F | Resp 20

## 2018-05-12 DIAGNOSIS — Z5111 Encounter for antineoplastic chemotherapy: Secondary | ICD-10-CM | POA: Insufficient documentation

## 2018-05-12 DIAGNOSIS — C50411 Malignant neoplasm of upper-outer quadrant of right female breast: Secondary | ICD-10-CM | POA: Insufficient documentation

## 2018-05-12 MED ORDER — GOSERELIN ACETATE 10.8 MG ~~LOC~~ IMPL
10.8000 mg | DRUG_IMPLANT | Freq: Once | SUBCUTANEOUS | Status: AC
  Administered 2018-05-12: 10.8 mg via SUBCUTANEOUS
  Filled 2018-05-12: qty 10.8

## 2018-05-12 NOTE — Patient Instructions (Signed)
Goserelin injection What is this medicine? GOSERELIN (GOE se rel in) is similar to a hormone found in the body. It lowers the amount of sex hormones that the body makes. Men will have lower testosterone levels and women will have lower estrogen levels while taking this medicine. In men, this medicine is used to treat prostate cancer; the injection is either given once per month or once every 12 weeks. A once per month injection (only) is used to treat women with endometriosis, dysfunctional uterine bleeding, or advanced breast cancer. This medicine may be used for other purposes; ask your health care provider or pharmacist if you have questions. What should I tell my health care provider before I take this medicine? They need to know if you have any of these conditions (some only apply to women): -diabetes -heart disease or previous heart attack -high blood pressure -high cholesterol -kidney disease -osteoporosis or low bone density -problems passing urine -spinal cord injury -stroke -tobacco smoker -an unusual or allergic reaction to goserelin, hormone therapy, other medicines, foods, dyes, or preservatives -pregnant or trying to get pregnant -breast-feeding How should I use this medicine? This medicine is for injection under the skin. It is given by a health care professional in a hospital or clinic setting. Men receive this injection once every 4 weeks or once every 12 weeks. Women will only receive the once every 4 weeks injection. Talk to your pediatrician regarding the use of this medicine in children. Special care may be needed. Overdosage: If you think you have taken too much of this medicine contact a poison control center or emergency room at once. NOTE: This medicine is only for you. Do not share this medicine with others. What if I miss a dose? It is important not to miss your dose. Call your doctor or health care professional if you are unable to keep an appointment. What may  interact with this medicine? -female hormones like estrogen -herbal or dietary supplements like black cohosh, chasteberry, or DHEA -female hormones like testosterone -prasterone This list may not describe all possible interactions. Give your health care provider a list of all the medicines, herbs, non-prescription drugs, or dietary supplements you use. Also tell them if you smoke, drink alcohol, or use illegal drugs. Some items may interact with your medicine. What should I watch for while using this medicine? Visit your doctor or health care professional for regular checks on your progress. Your symptoms may appear to get worse during the first weeks of this therapy. Tell your doctor or healthcare professional if your symptoms do not start to get better or if they get worse after this time. Your bones may get weaker if you take this medicine for a long time. If you smoke or frequently drink alcohol you may increase your risk of bone loss. A family history of osteoporosis, chronic use of drugs for seizures (convulsions), or corticosteroids can also increase your risk of bone loss. Talk to your doctor about how to keep your bones strong. This medicine should stop regular monthly menstration in women. Tell your doctor if you continue to menstrate. Women should not become pregnant while taking this medicine or for 12 weeks after stopping this medicine. Women should inform their doctor if they wish to become pregnant or think they might be pregnant. There is a potential for serious side effects to an unborn child. Talk to your health care professional or pharmacist for more information. Do not breast-feed an infant while taking this medicine. Men should   inform their doctors if they wish to father a child. This medicine may lower sperm counts. Talk to your health care professional or pharmacist for more information. What side effects may I notice from receiving this medicine? Side effects that you should  report to your doctor or health care professional as soon as possible: -allergic reactions like skin rash, itching or hives, swelling of the face, lips, or tongue -bone pain -breathing problems -changes in vision -chest pain -feeling faint or lightheaded, falls -fever, chills -pain, swelling, warmth in the leg -pain, tingling, numbness in the hands or feet -signs and symptoms of low blood pressure like dizziness; feeling faint or lightheaded, falls; unusually weak or tired -stomach pain -swelling of the ankles, feet, hands -trouble passing urine or change in the amount of urine -unusually high or low blood pressure -unusually weak or tired Side effects that usually do not require medical attention (report to your doctor or health care professional if they continue or are bothersome): -change in sex drive or performance -changes in breast size in both males and females -changes in emotions or moods -headache -hot flashes -irritation at site where injected -loss of appetite -skin problems like acne, dry skin -vaginal dryness This list may not describe all possible side effects. Call your doctor for medical advice about side effects. You may report side effects to FDA at 1-800-FDA-1088. Where should I keep my medicine? This drug is given in a hospital or clinic and will not be stored at home. NOTE: This sheet is a summary. It may not cover all possible information. If you have questions about this medicine, talk to your doctor, pharmacist, or health care provider.    2016, Elsevier/Gold Standard. (2014-02-15 11:10:35)  

## 2018-05-12 NOTE — Progress Notes (Unsigned)
The patient is re enrolled for drug assistance by [employer] for Faslodex 10.8 mg 04/06/18 - 04/07/19.  Re enrollment will replace drug for DOS 05/12/18.

## 2018-05-29 ENCOUNTER — Other Ambulatory Visit: Payer: Self-pay | Admitting: Oncology

## 2018-05-29 MED FILL — XARELTO 20 MG TABLET: 20 | 30 days supply | Qty: 30 | Fill #0

## 2018-06-29 ENCOUNTER — Other Ambulatory Visit: Payer: Self-pay | Admitting: Oncology

## 2018-06-29 MED FILL — XARELTO 20 MG TABLET: 20 | 30 days supply | Qty: 30 | Fill #0

## 2018-07-31 ENCOUNTER — Other Ambulatory Visit: Payer: Self-pay | Admitting: Oncology

## 2018-07-31 MED FILL — XARELTO 20 MG TABLET: 20 | 30 days supply | Qty: 30 | Fill #0

## 2018-08-03 NOTE — Progress Notes (Signed)
ID: Jenna Gonzales   DOB: Jul 04, 1971  MR#: [id number]  PPJ#:[id number]  PCP: Lucretia Kern, DO GYN: [name], MD SU: Excell Seltzer, MD OTHER MD: Sumner Boast MD  CHIEF COMPLAINTS:  1) Hx of Right Breast Cancer      2) DVT/PE   CURRENT THERAPY: goserelin injections  BREAST CANCER HISTORY: From the original intake note:  The patient saw Dr. Garwin Brothers for a routine gynecologic evaluation, and Dr. Garwin Brothers did a breast exam which showed a palpable mass in the right breast.  She was referred for a diagnostic mammography 11/03/2009, and Dr. Claudie Revering was able to confirm a palpable mass in the 10 o'clock position of the right breast 7 cm from the nipple.  There was no palpable adenopathy.  By mammography the breasts were dense, but there was a spiculated mass in the upper outer right breast corresponding to the palpable mass by ultrasound as measured 2.1 cm.  It was hypoechoic, irregular, and poorly defined.  Ultrasound of the right axilla demonstrated several normal sized lymph nodes, several of them showing cortical thickening.    With this information, the patient was brought back on 11/19 for biopsy.  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  The lymph node biopsy obtained at the same time was negative.  The tumor was ER positive at 99%, PR positive at 20% with a low proliferation marker at 12%, and HER-2/neu was not amplified with a ratio of 1.45.  Bilateral breast MRIs were obtained 11/22.  This confirmed a 2.2 cm irregularly marginated enhancing mass in the upper outer right breast with a clip in the mass.  There were no other masses, and aside from post biopsy changes in the right axilla, there were no abnormal appearing lymph nodes.    The patient proceeded to right lumpectomy and sentinel lymph node sampling 12/09 under Dr. Excell Seltzer because the two sentinel lymph nodes provided positive for cancer.  Dr. Excell Seltzer  proceeded to a right axillary lymph node dissection in the same procedure.  The final pathology [id number]) showed an invasive ductal carcinoma, grade 1, with negative although close margins (the closest was 1 mm posteriorly), with evidence of lymphovascular invasion and involving 2 out of 17 lymph nodes sampled (the 2 sentinel lymph nodes).   Her subsequent history is as detailed below.  INTERVAL HISTORY: Jenna Gonzales returns today for follow-up of her breast cancer history as well as for follow-up of her hypercoagulable state. She continues on Xarelto, with good tolerance. She has mild bruising, but she denies any other bleeding issues.   For her breast cancer, she receives goserelin every 3 months, with a dose due today. She has occasional hot flashes. She denies issues with vaginal dryness  She plans to schedule mammography this year since she has stable insurance.     REVIEW OF SYSTEMS: Jenna Gonzales reports that she is now permanent full time at [employer]. Her son moved out and she lives by herself. 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 For exercise, she walks 16 miles per week and tracks this on her phone.   She denies unusual headaches, visual changes, nausea, vomiting, or dizziness. There has been no unusual cough, phlegm production, or pleurisy. There has been no change in bowel or bladder habits. She denies unexplained fatigue or unexplained weight loss, bleeding, rash, or fever. A detailed review of systems was otherwise stable.   PAST MEDICAL HISTORY: Past Medical History:  Diagnosis Date  . Abnormal uterine bleeding (AUB)   . Allergic rhinitis   . Anxiety   . Borderline hypertension   . Endometrial polyp   . History of breast cancer oncologist-  dr Ron Agee-- no recurrence   dx 11/ 2010  s/p  right lumpectomy w/ snl dissection 11-30-2009/  Stage 2B, DCIS, grade 1, (T2 N1),  ER/PR+,  HER2 negative/  chemo completed May 2011 and radiation completed July 2011 and  completed anastrozole July 2016  . History of cancer chemotherapy    right breast-- complete 04/2010  . History of DVT of lower extremity    09-22-2013  right lower extremitiy (femoral, popliteal, posterior tibial)  . History of pulmonary embolus (PE)    09-22-2013  . History of radiation therapy    right breast -- complete 07/ 2011  . Osteopenia   . Wears contact lenses   Mild GERD, history of a heart murmur, which was not appreciated today, history of borderline hypertension not under treatment, history of irritable bowel disease, and history of minimal arthritic changes involving particularly the index finger of the right hand.  PAST SURGICAL HISTORY: Past Surgical History:  Procedure Laterality Date  . BREAST LUMPECTOMY WITH AXILLARY LYMPH NODE DISSECTION Right 11-30-2009  . cryosurgery cervix     2012  . DILATATION & CURETTAGE/HYSTEROSCOPY WITH MYOSURE N/A 04/29/2016   Procedure: DILATATION & CURETTAGE/POLYPECTOMY/HYSTEROSCOPY WITH MYOSURE;  Surgeon: [name], MD;  Location: [facility];  Service: Gynecology;  Laterality: N/A;  multiple endometrial polyps  . PORT-A-CATH PLACEMENT /  REMOVAL  01-10-2010/  removal 05-09-2010    FAMILY HISTORY Family History  Problem Relation Age of Onset  . Leukemia Father   . Arthritis Unknown   . Heart disease Unknown   . Hypertension Unknown   . Kidney disease Unknown   . Diabetes Unknown   . Diabetes Other   The patient's father died from leukemia at the age of 29 (I cannot tell from the history if this was chronic or acute).  The patient's mother is alive at age 84.  The patient has 2 sisters and 1 brother, all here in [place].  She also has 2 half brothers living in [place].  There is no history of breast or ovarian cancer in the family.  GYNECOLOGIC HISTORY: (Updated 02/24/2014) She is GX, P1.  First pregnancy to term age 84.  Last menstrual period was in March 2011 with the initiation of chemotherapy. Hormone  levels drawn in October 2014 showed patient to  be premenopausal.   SOCIAL HISTORY:    (Updated 02/24/2014) The patient previously worked at [employer] in [place].  She worked for the [employer] for 8 years. 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 She is single. Her son Jenna Gonzales lives with her.  He "could never finish school," and does not have a job at present.  He does have 2 daughters who live with their mother here in [place].  The patient attends [employer].    ADVANCED DIRECTIVES: Not in place  HEALTH MAINTENANCE: (Updated 02/24/2014) Social History   Tobacco Use  . Smoking status: Never Smoker  . Smokeless tobacco: Never Used  Substance Use Topics  . Alcohol use: Yes    Alcohol/week: 0.0 standard drinks    Comment: socially  . Drug use: No     Colonoscopy: Never  PAP: UTD/Dr. Garwin Brothers  Bone density: Never/Scheduled for 03/11/2014  Lipid panel: Not on file   Allergies  Allergen Reactions  . Tamoxifen Other (See Comments)    Blood clots    Current Outpatient Medications  Medication Sig Dispense Refill  . Loratadine 10 MG CAPS Take 1 capsule (10 mg total) by mouth as needed (allergies). 30 each   . metoprolol tartrate (LOPRESSOR) 25 MG tablet Take 1 tablet (25 mg total) by mouth 2 (two) times daily. *Please call and schedule a one year follow up appointment 1st attempt* 60 tablet 0  . XARELTO 20 MG TABS tablet TAKE 1 TABLET BY MOUTH ONCE DAILY WITH SUPPER 30 tablet 0   No current facility-administered medications for this visit.    Facility-Administered Medications Ordered in Other Visits  Medication Dose Route Frequency Provider Last Rate Last Dose  . goserelin (ZOLADEX) injection 10.8 mg  10.8 mg Subcutaneous Q90 days Gentry Fitz F, NP   10.8 mg at 10/25/15 1625    OBJECTIVE: Young African American woman who appears stated age  64:   08/04/18  1135  BP: (!) 148/97  Pulse: (!) 104  Resp: 18  Temp: 98.9 F (37.2 C)  SpO2: 97%     Body mass index is 28.84 kg/m.    ECOG FS: 1 Filed Weights   08/04/18 1135  Weight: 192 lb 7.5 oz (87.3 kg)   Sclerae unicteric, EOMs intact Oropharynx clear and moist No cervical or supraclavicular adenopathy Lungs no rales or rhonchi Heart regular rate and rhythm Abd soft, nontender, positive bowel sounds MSK no focal spinal tenderness, no upper extremity lymphedema Neuro: nonfocal, well oriented, appropriate affect Breasts: Right breast is status post lumpectomy followed by radiation.  There is no evidence of local recurrence.  Left breast is benign.  Both axillae are benign.   LAB RESULTS: Lab Results  Component Value Date   WBC 7.4 02/17/2018   NEUTROABS 3.9 02/17/2018   HGB 15.0 02/17/2018   HCT 45.1 02/17/2018   MCV 102.5 (H) 02/17/2018   PLT 233 02/17/2018      Chemistry      Component Value Date/Time   NA 142 02/17/2018 1520   NA 141 11/25/2017 1523   K 3.5 02/17/2018 1520   K 3.7 11/25/2017 1523   CL 106 02/17/2018 1520   CL 106 01/19/2013 1507   CO2 23 02/17/2018 1520   CO2 23 11/25/2017 1523   BUN 10 02/17/2018 1520   BUN 11.8 11/25/2017 1523   CREATININE 0.86 02/17/2018 1520   CREATININE 0.9 11/25/2017 1523      Component Value Date/Time   CALCIUM 10.0 02/17/2018 1520   CALCIUM 9.7 11/25/2017 1523   ALKPHOS 137 02/17/2018 1520   ALKPHOS 123 11/25/2017 1523   AST 19 02/17/2018 1520   AST 16 11/25/2017 1523   ALT 23 02/17/2018 1520   ALT 16 11/25/2017 1523   BILITOT 0.4 02/17/2018 1520   BILITOT 0.37 11/25/2017 1523       STUDIES: She is overdue for mammography.  ASSESSMENT: 47 y.o.  [place] woman   (1)  status post right breast upper outer quadrant lumpectomy and axillary lymph node dissection December of 2010 for a T2 N1, stage IIB invasive ductal carcinoma, grade 1, strongly estrogen receptor positive, moderately progesterone receptor positive,  HER-2 negative, with a low proliferation fraction;   (2)  treated adjuvantly with 6 cycles of docetaxel/ doxorubicin/ cyclophosphamide, completed in May of 2011,   (3)   followed by radiation completed July of 2011 at which time she started tamoxifen.  (  4)  tamoxifen was discontinued in October 2014 when patient was diagnosed with a right lower extremity DVT and pulmonary embolism.   (a) anti-coagulation therapy with Xarelto, beginning 09/23/2013, completed 03/23/2014  (b) repeat Doppler ultrasonography 06/09/2014 finds no evidence of residual clot  (c) second acute right lower extremity DVT documented 05/06/2016 following uterine polypectomy  (d) hypercoagulable panel 05/27/2016 documents a lupus anticoagulant  (e) xarelto started May 2017, to be continued indefinitely  (5)  labs drawn in October 2014 confirmed a premenopausal status. Patient received her first Q3 month injection of goserelin on 12/02/2013, and then began on anastrozole   (7) completed 5 years of anastrozole as of July 2016. Goserelin continued at patient's request    PLAN: Jenna Gonzales is now nearly 9 years out from definitive surgery for her breast cancer with no evidence of disease recurrence.  This is very favorable.  She finally has a steady job--actually 2 jobs) and therefore is able to get her mammography done.  I have put that order in for her.  She is tolerating the Xarelto well, with no evidence of unusual bruising or bleeding.  I have refilled that medication for her.  She will see me again in approximately 1 year.  We will continue to check lab work for her.  Note that she will turn 48 next year and she will need a colonoscopy at that time.  She knows to call for any other issues that may develop before then.   [name], [name], MD  08/04/18 11:50 AM Medical Oncology and Hematology [facility] [address] [place], [place] [address] Tel. [phone]    Fax. [phone]  Alice Rieger, am acting as scribe for Chauncey Cruel MD.  I, Lurline Del MD, have reviewed the above documentation for accuracy and completeness, and I agree with the above.

## 2018-08-04 ENCOUNTER — Inpatient Hospital Stay: Payer: BLUE CROSS/BLUE SHIELD | Attending: Oncology | Admitting: Oncology

## 2018-08-04 ENCOUNTER — Inpatient Hospital Stay: Payer: BLUE CROSS/BLUE SHIELD

## 2018-08-04 ENCOUNTER — Telehealth: Payer: Self-pay | Admitting: Oncology

## 2018-08-04 VITALS — BP 148/97 | HR 104 | Temp 98.9°F | Resp 18 | Ht 68.5 in | Wt 192.5 lb

## 2018-08-04 DIAGNOSIS — Z9221 Personal history of antineoplastic chemotherapy: Secondary | ICD-10-CM

## 2018-08-04 DIAGNOSIS — Z5111 Encounter for antineoplastic chemotherapy: Secondary | ICD-10-CM | POA: Insufficient documentation

## 2018-08-04 DIAGNOSIS — M8580 Other specified disorders of bone density and structure, unspecified site: Secondary | ICD-10-CM

## 2018-08-04 DIAGNOSIS — K219 Gastro-esophageal reflux disease without esophagitis: Secondary | ICD-10-CM

## 2018-08-04 DIAGNOSIS — Z79899 Other long term (current) drug therapy: Secondary | ICD-10-CM | POA: Insufficient documentation

## 2018-08-04 DIAGNOSIS — Z806 Family history of leukemia: Secondary | ICD-10-CM | POA: Diagnosis not present

## 2018-08-04 DIAGNOSIS — C50411 Malignant neoplasm of upper-outer quadrant of right female breast: Secondary | ICD-10-CM | POA: Insufficient documentation

## 2018-08-04 DIAGNOSIS — Z7901 Long term (current) use of anticoagulants: Secondary | ICD-10-CM | POA: Insufficient documentation

## 2018-08-04 DIAGNOSIS — Z923 Personal history of irradiation: Secondary | ICD-10-CM | POA: Diagnosis not present

## 2018-08-04 DIAGNOSIS — Z170 Estrogen receptor positive status [ER+]: Secondary | ICD-10-CM | POA: Insufficient documentation

## 2018-08-04 DIAGNOSIS — Z86711 Personal history of pulmonary embolism: Secondary | ICD-10-CM | POA: Diagnosis not present

## 2018-08-04 DIAGNOSIS — Z86718 Personal history of other venous thrombosis and embolism: Secondary | ICD-10-CM | POA: Diagnosis not present

## 2018-08-04 MED ORDER — RIVAROXABAN 20 MG PO TABS: 20.0000 mg | ORAL_TABLET | Freq: Every day | ORAL | 4 refills | Status: DC

## 2018-08-04 MED ORDER — GOSERELIN ACETATE 3.6 MG ~~LOC~~ IMPL
DRUG_IMPLANT | SUBCUTANEOUS | Status: AC
  Filled 2018-08-04: qty 3.6

## 2018-08-04 MED ORDER — GOSERELIN ACETATE 10.8 MG ~~LOC~~ IMPL
10.8000 mg | DRUG_IMPLANT | Freq: Once | SUBCUTANEOUS | Status: AC
  Administered 2018-08-04: 10.8 mg via SUBCUTANEOUS
  Filled 2018-08-04: qty 10.8

## 2018-08-04 NOTE — Telephone Encounter (Signed)
Per 8/13 los patient stated she should get injection every 12 weeks. I did schedule that however she did not want to wait to verify with MD. She walk out and ask if I could mail something

## 2018-08-05 ENCOUNTER — Other Ambulatory Visit: Payer: Self-pay | Admitting: Oncology

## 2018-08-05 DIAGNOSIS — Z1231 Encounter for screening mammogram for malignant neoplasm of breast: Secondary | ICD-10-CM

## 2018-08-18 ENCOUNTER — Inpatient Hospital Stay: Payer: BLUE CROSS/BLUE SHIELD

## 2018-09-01 MED FILL — XARELTO 20 MG TABLET: 20 | 30 days supply | Qty: 30 | Fill #0

## 2018-09-11 ENCOUNTER — Ambulatory Visit: Payer: PRIVATE HEALTH INSURANCE

## 2018-10-05 ENCOUNTER — Encounter: Payer: Self-pay | Admitting: Oncology

## 2018-10-05 NOTE — Progress Notes (Signed)
Faxed renewal application to [employer] for Xarelto today.  Will notify pt of the outcome.

## 2018-10-07 MED FILL — XARELTO 20 MG TABLET: 20 | 30 days supply | Qty: 30 | Fill #1

## 2018-10-08 ENCOUNTER — Encounter: Payer: Self-pay | Admitting: Oncology

## 2018-10-08 NOTE — Progress Notes (Signed)
[facility] to check status on the renewal application for Xarelto.  The rep informed me the application was denied.  They talked with the pt and informed her of the decision.

## 2018-10-27 ENCOUNTER — Inpatient Hospital Stay: Payer: BLUE CROSS/BLUE SHIELD | Attending: Oncology

## 2018-10-27 VITALS — BP 162/100 | HR 112 | Temp 98.1°F | Resp 18

## 2018-10-27 DIAGNOSIS — Z170 Estrogen receptor positive status [ER+]: Secondary | ICD-10-CM | POA: Diagnosis not present

## 2018-10-27 DIAGNOSIS — C50411 Malignant neoplasm of upper-outer quadrant of right female breast: Secondary | ICD-10-CM

## 2018-10-27 DIAGNOSIS — Z5111 Encounter for antineoplastic chemotherapy: Secondary | ICD-10-CM | POA: Diagnosis not present

## 2018-10-27 MED ORDER — GOSERELIN ACETATE 10.8 MG ~~LOC~~ IMPL
10.8000 mg | DRUG_IMPLANT | Freq: Once | SUBCUTANEOUS | Status: AC
  Administered 2018-10-27: 10.8 mg via SUBCUTANEOUS
  Filled 2018-10-27: qty 10.8

## 2018-11-04 MED FILL — XARELTO 20 MG TABLET: 20 | 30 days supply | Qty: 30 | Fill #2

## 2018-12-10 MED FILL — XARELTO 20 MG TABLET: 20 | 30 days supply | Qty: 30 | Fill #3

## 2019-01-14 MED FILL — XARELTO 20 MG TABLET: 20 | 30 days supply | Qty: 30 | Fill #4

## 2019-01-19 ENCOUNTER — Inpatient Hospital Stay: Payer: BLUE CROSS/BLUE SHIELD | Attending: Oncology

## 2019-01-19 DIAGNOSIS — Z5111 Encounter for antineoplastic chemotherapy: Secondary | ICD-10-CM | POA: Diagnosis not present

## 2019-01-19 DIAGNOSIS — C50411 Malignant neoplasm of upper-outer quadrant of right female breast: Secondary | ICD-10-CM

## 2019-01-19 DIAGNOSIS — Z170 Estrogen receptor positive status [ER+]: Secondary | ICD-10-CM | POA: Insufficient documentation

## 2019-01-19 MED ORDER — GOSERELIN ACETATE 10.8 MG ~~LOC~~ IMPL
10.8000 mg | DRUG_IMPLANT | Freq: Once | SUBCUTANEOUS | Status: AC
  Administered 2019-01-19: 10.8 mg via SUBCUTANEOUS
  Filled 2019-01-19: qty 10.8

## 2019-02-18 MED FILL — XARELTO 20 MG TABLET: 20 | 30 days supply | Qty: 30 | Fill #5

## 2019-03-16 MED FILL — XARELTO 20 MG TABLET: 20 | 30 days supply | Qty: 30 | Fill #6

## 2019-04-01 ENCOUNTER — Telehealth: Payer: Self-pay | Admitting: Oncology

## 2019-04-01 NOTE — Telephone Encounter (Signed)
Tried to reach regarding rescheduling however when dialing the number on file, the voicemail has not been setup yet.

## 2019-04-12 MED FILL — XARELTO 20 MG TABLET: 20 | 30 days supply | Qty: 30 | Fill #7

## 2019-04-13 ENCOUNTER — Inpatient Hospital Stay: Payer: BLUE CROSS/BLUE SHIELD | Attending: Oncology

## 2019-04-13 ENCOUNTER — Other Ambulatory Visit: Payer: Self-pay

## 2019-04-13 DIAGNOSIS — C50411 Malignant neoplasm of upper-outer quadrant of right female breast: Secondary | ICD-10-CM

## 2019-04-13 DIAGNOSIS — Z170 Estrogen receptor positive status [ER+]: Secondary | ICD-10-CM | POA: Diagnosis not present

## 2019-04-13 DIAGNOSIS — Z923 Personal history of irradiation: Secondary | ICD-10-CM | POA: Insufficient documentation

## 2019-04-13 DIAGNOSIS — Z9221 Personal history of antineoplastic chemotherapy: Secondary | ICD-10-CM | POA: Insufficient documentation

## 2019-04-13 DIAGNOSIS — Z5111 Encounter for antineoplastic chemotherapy: Secondary | ICD-10-CM | POA: Diagnosis not present

## 2019-04-13 MED ORDER — GOSERELIN ACETATE 10.8 MG ~~LOC~~ IMPL
10.8000 mg | DRUG_IMPLANT | Freq: Once | SUBCUTANEOUS | Status: AC
  Administered 2019-04-13: 10.8 mg via SUBCUTANEOUS
  Filled 2019-04-13: qty 10.8

## 2019-04-13 NOTE — Patient Instructions (Signed)
Goserelin injection What is this medicine? GOSERELIN (GOE se rel in) is similar to a hormone found in the body. It lowers the amount of sex hormones that the body makes. Men will have lower testosterone levels and women will have lower estrogen levels while taking this medicine. In men, this medicine is used to treat prostate cancer; the injection is either given once per month or once every 12 weeks. A once per month injection (only) is used to treat women with endometriosis, dysfunctional uterine bleeding, or advanced breast cancer. This medicine may be used for other purposes; ask your health care provider or pharmacist if you have questions. COMMON BRAND NAME(S): Zoladex What should I tell my health care provider before I take this medicine? They need to know if you have any of these conditions (some only apply to women): -diabetes -heart disease or previous heart attack -high blood pressure -high cholesterol -kidney disease -osteoporosis or low bone density -problems passing urine -spinal cord injury -stroke -tobacco smoker -an unusual or allergic reaction to goserelin, hormone therapy, other medicines, foods, dyes, or preservatives -pregnant or trying to get pregnant -breast-feeding How should I use this medicine? This medicine is for injection under the skin. It is given by a health care professional in a hospital or clinic setting. Men receive this injection once every 4 weeks or once every 12 weeks. Women will only receive the once every 4 weeks injection. Talk to your pediatrician regarding the use of this medicine in children. Special care may be needed. Overdosage: If you think you have taken too much of this medicine contact a poison control center or emergency room at once. NOTE: This medicine is only for you. Do not share this medicine with others. What if I miss a dose? It is important not to miss your dose. Call your doctor or health care professional if you are unable to  keep an appointment. What may interact with this medicine? -female hormones like estrogen -herbal or dietary supplements like black cohosh, chasteberry, or DHEA -female hormones like testosterone -prasterone This list may not describe all possible interactions. Give your health care provider a list of all the medicines, herbs, non-prescription drugs, or dietary supplements you use. Also tell them if you smoke, drink alcohol, or use illegal drugs. Some items may interact with your medicine. What should I watch for while using this medicine? Visit your doctor or health care professional for regular checks on your progress. Your symptoms may appear to get worse during the first weeks of this therapy. Tell your doctor or healthcare professional if your symptoms do not start to get better or if they get worse after this time. Your bones may get weaker if you take this medicine for a long time. If you smoke or frequently drink alcohol you may increase your risk of bone loss. A family history of osteoporosis, chronic use of drugs for seizures (convulsions), or corticosteroids can also increase your risk of bone loss. Talk to your doctor about how to keep your bones strong. This medicine should stop regular monthly menstration in women. Tell your doctor if you continue to menstrate. Women should not become pregnant while taking this medicine or for 12 weeks after stopping this medicine. Women should inform their doctor if they wish to become pregnant or think they might be pregnant. There is a potential for serious side effects to an unborn child. Talk to your health care professional or pharmacist for more information. Do not breast-feed an infant while taking   this medicine. Men should inform their doctors if they wish to father a child. This medicine may lower sperm counts. Talk to your health care professional or pharmacist for more information. What side effects may I notice from receiving this  medicine? Side effects that you should report to your doctor or health care professional as soon as possible: -allergic reactions like skin rash, itching or hives, swelling of the face, lips, or tongue -bone pain -breathing problems -changes in vision -chest pain -feeling faint or lightheaded, falls -fever, chills -pain, swelling, warmth in the leg -pain, tingling, numbness in the hands or feet -signs and symptoms of low blood pressure like dizziness; feeling faint or lightheaded, falls; unusually weak or tired -stomach pain -swelling of the ankles, feet, hands -trouble passing urine or change in the amount of urine -unusually high or low blood pressure -unusually weak or tired Side effects that usually do not require medical attention (report to your doctor or health care professional if they continue or are bothersome): -change in sex drive or performance -changes in breast size in both males and females -changes in emotions or moods -headache -hot flashes -irritation at site where injected -loss of appetite -skin problems like acne, dry skin -vaginal dryness This list may not describe all possible side effects. Call your doctor for medical advice about side effects. You may report side effects to FDA at 1-800-FDA-1088. Where should I keep my medicine? This drug is given in a hospital or clinic and will not be stored at home. NOTE: This sheet is a summary. It may not cover all possible information. If you have questions about this medicine, talk to your doctor, pharmacist, or health care provider.  2019 Elsevier/Gold Standard (2014-02-15 11:10:35)  

## 2019-05-10 MED FILL — XARELTO 20 MG TABLET: 20 | 30 days supply | Qty: 30 | Fill #8

## 2019-07-05 MED FILL — XARELTO 20 MG TABLET: 20 | 30 days supply | Qty: 30 | Fill #9

## 2019-07-06 ENCOUNTER — Ambulatory Visit: Payer: PRIVATE HEALTH INSURANCE

## 2019-07-06 ENCOUNTER — Inpatient Hospital Stay: Payer: BC Managed Care – PPO | Attending: Oncology

## 2019-07-06 ENCOUNTER — Other Ambulatory Visit: Payer: Self-pay

## 2019-07-06 VITALS — BP 148/99 | Temp 99.0°F | Resp 18

## 2019-07-06 DIAGNOSIS — C50411 Malignant neoplasm of upper-outer quadrant of right female breast: Secondary | ICD-10-CM | POA: Diagnosis present

## 2019-07-06 DIAGNOSIS — Z171 Estrogen receptor negative status [ER-]: Secondary | ICD-10-CM | POA: Insufficient documentation

## 2019-07-06 DIAGNOSIS — Z5111 Encounter for antineoplastic chemotherapy: Secondary | ICD-10-CM | POA: Diagnosis present

## 2019-07-06 MED ORDER — GOSERELIN ACETATE 10.8 MG ~~LOC~~ IMPL
10.8000 mg | DRUG_IMPLANT | Freq: Once | SUBCUTANEOUS | Status: AC
  Administered 2019-07-06: 10.8 mg via SUBCUTANEOUS
  Filled 2019-07-06: qty 10.8

## 2019-07-06 NOTE — Patient Instructions (Signed)

## 2019-08-05 ENCOUNTER — Other Ambulatory Visit: Payer: Self-pay | Admitting: Oncology

## 2019-08-05 MED FILL — XARELTO 20 MG TABLET: 20 | 30 days supply | Qty: 30 | Fill #0

## 2019-09-08 MED FILL — XARELTO 20 MG TABLET: 20 | 30 days supply | Qty: 30 | Fill #1

## 2019-09-22 ENCOUNTER — Encounter: Payer: Self-pay | Admitting: Oncology

## 2019-09-22 NOTE — Progress Notes (Signed)
Entered chart for [facility] w/chemo authorizations to provide authorization information as her computer was frozen and she was on the line with insurance company.

## 2019-09-23 ENCOUNTER — Encounter: Payer: Self-pay | Admitting: Oncology

## 2019-09-23 NOTE — Progress Notes (Signed)
Spoke w/ pt regarding copay assistance for Zoladex.  I informed her they were asking for her Rx information which is not located on her ins card so I gave her the number and advised she call the Zoladex Savings & Support team and provide that information to them.  I also informed her they have a 30 look back program so they will not be able to cover her April and July dates of service.  I requested once she's approved to let the foundation know to fax me all the information needed to submit claims.  She verbalized understanding.

## 2019-09-24 ENCOUNTER — Encounter: Payer: Self-pay | Admitting: Oncology

## 2019-09-24 NOTE — Progress Notes (Signed)
Pt is approved w/ Zoladex Savings Program for Zoladex from 08/24/19 to 12/23/19 for $2,000 and will reset at the beginning of the calendar year.

## 2019-09-28 ENCOUNTER — Inpatient Hospital Stay: Payer: BC Managed Care – PPO

## 2019-09-28 ENCOUNTER — Ambulatory Visit: Payer: PRIVATE HEALTH INSURANCE

## 2019-09-28 ENCOUNTER — Other Ambulatory Visit: Payer: Self-pay

## 2019-09-28 ENCOUNTER — Inpatient Hospital Stay: Payer: BC Managed Care – PPO | Attending: Oncology | Admitting: Oncology

## 2019-09-28 VITALS — BP 152/98 | HR 98 | Temp 98.7°F | Resp 18

## 2019-09-28 VITALS — BP 141/99 | HR 97 | Temp 98.7°F | Resp 20 | Wt 203.0 lb

## 2019-09-28 DIAGNOSIS — Z9221 Personal history of antineoplastic chemotherapy: Secondary | ICD-10-CM | POA: Diagnosis not present

## 2019-09-28 DIAGNOSIS — Z5111 Encounter for antineoplastic chemotherapy: Secondary | ICD-10-CM | POA: Insufficient documentation

## 2019-09-28 DIAGNOSIS — C50411 Malignant neoplasm of upper-outer quadrant of right female breast: Secondary | ICD-10-CM

## 2019-09-28 DIAGNOSIS — R232 Flushing: Secondary | ICD-10-CM | POA: Insufficient documentation

## 2019-09-28 DIAGNOSIS — Z8261 Family history of arthritis: Secondary | ICD-10-CM | POA: Diagnosis not present

## 2019-09-28 DIAGNOSIS — I82401 Acute embolism and thrombosis of unspecified deep veins of right lower extremity: Secondary | ICD-10-CM

## 2019-09-28 DIAGNOSIS — Z806 Family history of leukemia: Secondary | ICD-10-CM | POA: Insufficient documentation

## 2019-09-28 DIAGNOSIS — Z86718 Personal history of other venous thrombosis and embolism: Secondary | ICD-10-CM | POA: Insufficient documentation

## 2019-09-28 DIAGNOSIS — Z8249 Family history of ischemic heart disease and other diseases of the circulatory system: Secondary | ICD-10-CM | POA: Diagnosis not present

## 2019-09-28 DIAGNOSIS — Z86711 Personal history of pulmonary embolism: Secondary | ICD-10-CM | POA: Diagnosis not present

## 2019-09-28 DIAGNOSIS — Z853 Personal history of malignant neoplasm of breast: Secondary | ICD-10-CM | POA: Insufficient documentation

## 2019-09-28 DIAGNOSIS — Z170 Estrogen receptor positive status [ER+]: Secondary | ICD-10-CM

## 2019-09-28 DIAGNOSIS — D6862 Lupus anticoagulant syndrome: Secondary | ICD-10-CM

## 2019-09-28 DIAGNOSIS — Z923 Personal history of irradiation: Secondary | ICD-10-CM | POA: Diagnosis not present

## 2019-09-28 DIAGNOSIS — Z833 Family history of diabetes mellitus: Secondary | ICD-10-CM | POA: Insufficient documentation

## 2019-09-28 LAB — CBC WITH DIFFERENTIAL/PLATELET
Abs Immature Granulocytes: 0.02 10*3/uL (ref 0.00–0.07)
Basophils Absolute: 0 10*3/uL (ref 0.0–0.1)
Basophils Relative: 0 %
Eosinophils Absolute: 0 10*3/uL (ref 0.0–0.5)
Eosinophils Relative: 0 %
HCT: 48.3 % — ABNORMAL HIGH (ref 36.0–46.0)
Hemoglobin: 16.1 g/dL — ABNORMAL HIGH (ref 12.0–15.0)
Immature Granulocytes: 0 %
Lymphocytes Relative: 34 %
Lymphs Abs: 2.9 10*3/uL (ref 0.7–4.0)
MCH: 33.7 pg (ref 26.0–34.0)
MCHC: 33.3 g/dL (ref 30.0–36.0)
MCV: 101 fL — ABNORMAL HIGH (ref 80.0–100.0)
Monocytes Absolute: 0.6 10*3/uL (ref 0.1–1.0)
Monocytes Relative: 7 %
Neutro Abs: 5.1 10*3/uL (ref 1.7–7.7)
Neutrophils Relative %: 59 %
Platelets: 250 10*3/uL (ref 150–400)
RBC: 4.78 MIL/uL (ref 3.87–5.11)
RDW: 11.6 % (ref 11.5–15.5)
WBC: 8.7 10*3/uL (ref 4.0–10.5)
nRBC: 0 % (ref 0.0–0.2)

## 2019-09-28 LAB — COMPREHENSIVE METABOLIC PANEL
ALT: 22 U/L (ref 0–44)
AST: 17 U/L (ref 15–41)
Albumin: 4 g/dL (ref 3.5–5.0)
Alkaline Phosphatase: 126 U/L (ref 38–126)
Anion gap: 11 (ref 5–15)
BUN: 9 mg/dL (ref 6–20)
CO2: 26 mmol/L (ref 22–32)
Calcium: 9.7 mg/dL (ref 8.9–10.3)
Chloride: 104 mmol/L (ref 98–111)
Creatinine, Ser: 0.94 mg/dL (ref 0.44–1.00)
GFR calc Af Amer: >60 mL/min (ref >60)
GFR calc non Af Amer: >60 mL/min (ref >60)
Glucose, Bld: 119 mg/dL — ABNORMAL HIGH (ref 70–99)
Potassium: 3.7 mmol/L (ref 3.5–5.1)
Sodium: 141 mmol/L (ref 135–145)
Total Bilirubin: 0.4 mg/dL (ref 0.3–1.2)
Total Protein: 8.1 g/dL (ref 6.5–8.1)

## 2019-09-28 MED ORDER — GOSERELIN ACETATE 10.8 MG ~~LOC~~ IMPL
10.8000 mg | DRUG_IMPLANT | Freq: Once | SUBCUTANEOUS | Status: AC
  Administered 2019-09-28: 10.8 mg via SUBCUTANEOUS
  Filled 2019-09-28: qty 10.8

## 2019-09-28 NOTE — Progress Notes (Signed)
ID: Jenna Gonzales   DOB: 02-Jan-1971  MR#: [id number]  JSE#:[id number]  PCP: Patient, No Pcp Per GYN: [name], MD SU: Excell Seltzer, MD OTHER MD: Sumner Boast MD  CHIEF COMPLAINTS:  1) Hx of Right Breast Cancer      2) DVT/PE   CURRENT THERAPY: goserelin injections Q 3 months  BREAST CANCER HISTORY: From the original intake note:  The patient saw Dr. Garwin Brothers for a routine gynecologic evaluation, and Dr. Garwin Brothers did a breast exam which showed a palpable mass in the right breast.  She was referred for a diagnostic mammography 11/03/2009, and Dr. Claudie Revering was able to confirm a palpable mass in the 10 o'clock position of the right breast 7 cm from the nipple.  There was no palpable adenopathy.  By mammography the breasts were dense, but there was a spiculated mass in the upper outer right breast corresponding to the palpable mass by ultrasound as measured 2.1 cm.  It was hypoechoic, irregular, and poorly defined.  Ultrasound of the right axilla demonstrated several normal sized lymph nodes, several of them showing cortical thickening.    With this information, the patient was brought back on 11/19 for biopsy.  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  The lymph node biopsy obtained at the same time was negative.  The tumor was ER positive at 99%, PR positive at 20% with a low proliferation marker at 12%, and HER-2/neu was not amplified with a ratio of 1.45.  Bilateral breast MRIs were obtained 11/22.  This confirmed a 2.2 cm irregularly marginated enhancing mass in the upper outer right breast with a clip in the mass.  There were no other masses, and aside from post biopsy changes in the right axilla, there were no abnormal appearing lymph nodes.    The patient proceeded to right lumpectomy and sentinel lymph node sampling 12/09 under Dr. Excell Seltzer because the two sentinel lymph nodes provided positive for cancer.   Dr. Excell Seltzer proceeded to a right axillary lymph node dissection in the same procedure.  The final pathology [id number]) showed an invasive ductal carcinoma, grade 1, with negative although close margins (the closest was 1 mm posteriorly), with evidence of lymphovascular invasion and involving 2 out of 17 lymph nodes sampled (the 2 sentinel lymph nodes).   Her subsequent history is as detailed below.   INTERVAL HISTORY: [name] returns today for follow-up and treatment of her breast cancer history as well as for follow-up of her hypercoagulable state. She was last seen here on 08/04/2018.   She continues on gosarelin.  The hot flashes she has as a result have improved.  She has no other side effects from this.  Since her last visit here, she has not undergone any additional studies.  She is now receiving her mammography at [facility].  She tells me that she has scheduled herself for 1 this coming weekend.   REVIEW OF SYSTEMS: Kim popped a pimple in the medial aspect of her left breast and apparently that has caused some scar tissue.  She is terrified this may be a recurrence of her breast cancer, however that was on the contralateral breast.  It is not impossible of course.  Aside from that her brother has been diagnosed with lung cancer and this has been devastating for her.  She is extremely stressed.  A detailed review of systems today was otherwise stable    PAST MEDICAL HISTORY: Past Medical History:  Diagnosis Date  .  Abnormal uterine bleeding (AUB)   . Allergic rhinitis   . Anxiety   . Borderline hypertension   . Endometrial polyp   . History of breast cancer oncologist-  dr Ron Agee-- no recurrence   dx 11/ 2010  s/p  right lumpectomy w/ snl dissection 11-30-2009/  Stage 2B, DCIS, grade 1, (T2 N1),  ER/PR+,  HER2 negative/  chemo completed May 2011 and radiation completed July 2011 and completed anastrozole July 2016  . History of cancer chemotherapy    right breast-- complete  04/2010  . History of DVT of lower extremity    09-22-2013  right lower extremitiy (femoral, popliteal, posterior tibial)  . History of pulmonary embolus (PE)    09-22-2013  . History of radiation therapy    right breast -- complete 07/ 2011  . Osteopenia   . Wears contact lenses   Mild GERD, history of a heart murmur, which was not appreciated today, history of borderline hypertension not under treatment, history of irritable bowel disease, and history of minimal arthritic changes involving particularly the index finger of the right hand.  PAST SURGICAL HISTORY: Past Surgical History:  Procedure Laterality Date  . BREAST LUMPECTOMY WITH AXILLARY LYMPH NODE DISSECTION Right 11-30-2009  . cryosurgery cervix     2012  . DILATATION & CURETTAGE/HYSTEROSCOPY WITH MYOSURE N/A 04/29/2016   Procedure: DILATATION & CURETTAGE/POLYPECTOMY/HYSTEROSCOPY WITH MYOSURE;  Surgeon: [name], MD;  Location: [facility]  Service: Gynecology;  Laterality: N/A;  multiple endometrial polyps  . PORT-A-CATH PLACEMENT /  REMOVAL  01-10-2010/  removal 05-09-2010    FAMILY HISTORY Family History  Problem Relation Age of Onset  . Leukemia Father   . Arthritis Unknown   . Heart disease Unknown   . Hypertension Unknown   . Kidney disease Unknown   . Diabetes Unknown   . Diabetes Other   The patient's father died from leukemia at the age of 42 (I cannot tell from the history if this was chronic or acute).  The patient's mother is alive at age 104.  The patient has 2 sisters and 1 brother, all here in [place].  She also has 2 half brothers living in [place].  There is no history of breast or ovarian cancer in the family.  GYNECOLOGIC HISTORY: (Updated 02/24/2014) She is GX, P1.  First pregnancy to term age 71.  Last menstrual period was in March 2011 with the initiation of chemotherapy. Hormone levels drawn in October 2014 showed patient to  be premenopausal.   SOCIAL HISTORY:     (Updated 02/24/2014) The patient previously worked at [employer] in [employer].  She worked for the [employer] for 8 years. She now works works full-time elsewhere.  She is single and lives by herself.. Her son Dahrahn no longer lives with her.  He "could never finish school.".  He does have 2 daughters who live with their mother here in [place].  The patient attends [employer].    ADVANCED DIRECTIVES: Not in place  HEALTH MAINTENANCE: (Updated 02/24/2014) Social History   Tobacco Use  . Smoking status: Never Smoker  . Smokeless tobacco: Never Used  Substance Use Topics  . Alcohol use: Yes    Alcohol/week: 0.0 standard drinks    Comment: socially  . Drug use: No     Colonoscopy: Never  PAP: UTD/Dr. Garwin Brothers  Bone density: Never/Scheduled for 03/11/2014  Lipid panel: Not on file   Allergies  Allergen Reactions  . Tamoxifen  Other (See Comments)    Blood clots    Current Outpatient Medications  Medication Sig Dispense Refill  . Loratadine 10 MG CAPS Take 1 capsule (10 mg total) by mouth as needed (allergies). 30 each   . XARELTO 20 MG TABS tablet TAKE 1 TABLET BY MOUTH ONCE DAILY WITH SUPPER 90 tablet 0   No current facility-administered medications for this visit.    Facility-Administered Medications Ordered in Other Visits  Medication Dose Route Frequency Provider Last Rate Last Dose  . goserelin (ZOLADEX) injection 10.8 mg  10.8 mg Subcutaneous Q90 days Gentry Fitz F, NP   10.8 mg at 10/25/15 1625  . goserelin (ZOLADEX) injection 10.8 mg  10.8 mg Subcutaneous Once [name], [name], MD        OBJECTIVE: Young African American woman in no acute distress  Vitals:   09/28/19 1502  BP: (!) 141/99  Pulse: 97  Resp: 20  Temp: 98.7 F (37.1 C)  SpO2: 98%   Wt Readings from Last 3 Encounters:  09/28/19 203 lb (92.1 kg)  08/04/18 192 lb 7.5 oz (87.3 kg)  02/06/17 182 lb 11.2 oz (82.9 kg)   Body mass index is 30.42  kg/m.    ECOG FS:1 - Symptomatic but completely ambulatory  Ocular: Sclerae unicteric, pupils round and equal Ear-nose-throat: Wearing a mask Lymphatic: No cervical or supraclavicular adenopathy Lungs no rales or rhonchi Heart regular rate and rhythm Abd soft, nontender, positive bowel sounds MSK no focal spinal tenderness, no joint edema Neuro: non-focal, well-oriented, appropriate affect Breasts: The right breast has undergone lumpectomy and radiation.  There is no evidence of disease recurrence.  The left breast is benign.  The medial aspect of the left breast there is an area of apparent scar tissue.  This is imaged below.  Both axillae are benign.  Images of the medial left breast 09/28/2019        LAB RESULTS: Lab Results  Component Value Date   WBC 8.7 09/28/2019   NEUTROABS 5.1 09/28/2019   HGB 16.1 (H) 09/28/2019   HCT 48.3 (H) 09/28/2019   MCV 101.0 (H) 09/28/2019   PLT 250 09/28/2019      Chemistry      Component Value Date/Time   NA 141 09/28/2019 1446   NA 141 11/25/2017 1523   K 3.7 09/28/2019 1446   K 3.7 11/25/2017 1523   CL 104 09/28/2019 1446   CL 106 01/19/2013 1507   CO2 26 09/28/2019 1446   CO2 23 11/25/2017 1523   BUN 9 09/28/2019 1446   BUN 11.8 11/25/2017 1523   CREATININE 0.94 09/28/2019 1446   CREATININE 0.9 11/25/2017 1523      Component Value Date/Time   CALCIUM 9.7 09/28/2019 1446   CALCIUM 9.7 11/25/2017 1523   ALKPHOS 126 09/28/2019 1446   ALKPHOS 123 11/25/2017 1523   AST 17 09/28/2019 1446   AST 16 11/25/2017 1523   ALT 22 09/28/2019 1446   ALT 16 11/25/2017 1523   BILITOT 0.4 09/28/2019 1446   BILITOT 0.37 11/25/2017 1523       STUDIES: No results found.   ASSESSMENT: 48 y.o.  [place] woman   (1)  status post right breast upper outer quadrant lumpectomy and axillary lymph node dissection December of 2010 for a T2 N1, stage IIB invasive ductal carcinoma, grade 1, strongly estrogen receptor positive, moderately  progesterone receptor positive, HER-2 negative, with a low proliferation fraction;   (2)  treated adjuvantly with 6 cycles of docetaxel/ doxorubicin/ cyclophosphamide,  completed in May of 2011,   (3)   followed by radiation completed July of 2011 at which time she started tamoxifen.  (4)  tamoxifen was discontinued in October 2014 when patient was diagnosed with a right lower extremity DVT and pulmonary embolism.   (a) anti-coagulation therapy with Xarelto, beginning 09/23/2013, completed 03/23/2014  (b) repeat Doppler ultrasonography 06/09/2014 finds no evidence of residual clot  (c) second acute right lower extremity DVT documented 05/06/2016 following uterine polypectomy  (d) hypercoagulable panel 05/27/2016 documents a lupus anticoagulant  (e) xarelto started May 2017, to be continued indefinitely  (5)  labs drawn in October 2014 confirmed a premenopausal status. Patient received her first Q3 month injection of goserelin on 12/02/2013, and then began on anastrozole   (7) completed 5 years of anastrozole as of July 2016. Goserelin continued at patient's request    PLAN: [name] is now just about 10 years out from definitive surgery for her breast cancer with no evidence of disease recurrence.  This is very favorable.  I think what we are seeing in the medial left breast is scar tissue from her "popped pimple", which apparently caused a significant reaction or small area of infection.  Nevertheless she is scheduled for mammography later this week.  She tells me [facility] specifically requested this be done as a diagnostic mammogram and I have entered the order accordingly but asked her to make sure this is been approved so she does not end up with a large bill simply because of entering the wrong study.  She is due for colonoscopy now that she is 83.  She is terrified at any further testing and does not want to proceed with that at present  I also suggested that perhaps her ovaries are already not  functioning and she may not need goserelin.  She is not ready to make a test of that either.  When she returns for the next goserelin studies she will pop into my office just to take another look at the left breast lesion.  Otherwise she will see me again in 1 year.  She knows to call for any other issues that may develop before that visit.   [name], [name], MD  09/28/19 3:29 PM Medical Oncology and Hematology [facility] [place], [place] [address] Tel. [phone]    Fax. [phone]  I, Jacqualyn Posey am acting as a Education administrator for Chauncey Cruel, MD.   I, Lurline Del MD, have reviewed the above documentation for accuracy and completeness, and I agree with the above.

## 2019-09-30 ENCOUNTER — Telehealth: Payer: Self-pay | Admitting: Oncology

## 2019-09-30 NOTE — Telephone Encounter (Signed)
I talk with patient regarding schedule and she was very persistant about it being exactly 12weeks

## 2019-10-08 ENCOUNTER — Encounter: Payer: Self-pay | Admitting: Oncology

## 2019-10-11 MED FILL — XARELTO 20 MG TABLET: 20 | 30 days supply | Qty: 30 | Fill #2

## 2019-10-12 ENCOUNTER — Other Ambulatory Visit: Payer: Self-pay

## 2019-10-12 MED ORDER — RIVAROXABAN 20 MG PO TABS: ORAL_TABLET | ORAL | 0 refills | Status: DC

## 2019-11-12 MED FILL — XARELTO 20 MG TABLET: 20 | 30 days supply | Qty: 30 | Fill #0

## 2019-12-13 MED FILL — XARELTO 20 MG TABLET: 20 | 30 days supply | Qty: 30 | Fill #1

## 2019-12-21 ENCOUNTER — Inpatient Hospital Stay: Payer: BC Managed Care – PPO | Attending: Oncology

## 2019-12-21 ENCOUNTER — Other Ambulatory Visit: Payer: Self-pay

## 2019-12-21 VITALS — BP 141/101 | HR 118 | Temp 97.7°F | Resp 20

## 2019-12-21 DIAGNOSIS — Z7901 Long term (current) use of anticoagulants: Secondary | ICD-10-CM | POA: Diagnosis not present

## 2019-12-21 DIAGNOSIS — Z853 Personal history of malignant neoplasm of breast: Secondary | ICD-10-CM | POA: Diagnosis present

## 2019-12-21 DIAGNOSIS — C50411 Malignant neoplasm of upper-outer quadrant of right female breast: Secondary | ICD-10-CM

## 2019-12-21 DIAGNOSIS — Z86711 Personal history of pulmonary embolism: Secondary | ICD-10-CM | POA: Diagnosis not present

## 2019-12-21 DIAGNOSIS — Z79899 Other long term (current) drug therapy: Secondary | ICD-10-CM | POA: Diagnosis not present

## 2019-12-21 DIAGNOSIS — Z86718 Personal history of other venous thrombosis and embolism: Secondary | ICD-10-CM | POA: Diagnosis not present

## 2019-12-21 MED ORDER — GOSERELIN ACETATE 10.8 MG ~~LOC~~ IMPL
10.8000 mg | DRUG_IMPLANT | Freq: Once | SUBCUTANEOUS | Status: AC
  Administered 2019-12-21: 16:00:00 10.8 mg via SUBCUTANEOUS
  Filled 2019-12-21: qty 10.8

## 2019-12-21 MED ORDER — GOSERELIN ACETATE 3.6 MG ~~LOC~~ IMPL
DRUG_IMPLANT | SUBCUTANEOUS | Status: AC
  Filled 2019-12-21: qty 3.6

## 2019-12-21 NOTE — Patient Instructions (Signed)

## 2019-12-30 ENCOUNTER — Ambulatory Visit: Payer: BC Managed Care – PPO

## 2020-01-20 MED FILL — XARELTO 20 MG TABLET: 20 | 30 days supply | Qty: 30 | Fill #2

## 2020-02-25 ENCOUNTER — Other Ambulatory Visit: Payer: Self-pay | Admitting: Oncology

## 2020-02-25 MED FILL — XARELTO 20 MG TABLET: 20 | 30 days supply | Qty: 30 | Fill #0

## 2020-03-14 ENCOUNTER — Inpatient Hospital Stay: Payer: 59 | Attending: Oncology

## 2020-03-14 ENCOUNTER — Other Ambulatory Visit: Payer: Self-pay

## 2020-03-14 VITALS — BP 132/72 | HR 104 | Temp 98.2°F | Resp 18

## 2020-03-14 DIAGNOSIS — Z79899 Other long term (current) drug therapy: Secondary | ICD-10-CM | POA: Diagnosis not present

## 2020-03-14 DIAGNOSIS — Z8249 Family history of ischemic heart disease and other diseases of the circulatory system: Secondary | ICD-10-CM | POA: Diagnosis not present

## 2020-03-14 DIAGNOSIS — Z841 Family history of disorders of kidney and ureter: Secondary | ICD-10-CM | POA: Diagnosis not present

## 2020-03-14 DIAGNOSIS — Z806 Family history of leukemia: Secondary | ICD-10-CM | POA: Diagnosis not present

## 2020-03-14 DIAGNOSIS — Z86711 Personal history of pulmonary embolism: Secondary | ICD-10-CM | POA: Insufficient documentation

## 2020-03-14 DIAGNOSIS — Z170 Estrogen receptor positive status [ER+]: Secondary | ICD-10-CM | POA: Diagnosis not present

## 2020-03-14 DIAGNOSIS — Z8261 Family history of arthritis: Secondary | ICD-10-CM | POA: Diagnosis not present

## 2020-03-14 DIAGNOSIS — Z9221 Personal history of antineoplastic chemotherapy: Secondary | ICD-10-CM | POA: Diagnosis not present

## 2020-03-14 DIAGNOSIS — Z923 Personal history of irradiation: Secondary | ICD-10-CM | POA: Diagnosis not present

## 2020-03-14 DIAGNOSIS — Z833 Family history of diabetes mellitus: Secondary | ICD-10-CM | POA: Insufficient documentation

## 2020-03-14 DIAGNOSIS — C50411 Malignant neoplasm of upper-outer quadrant of right female breast: Secondary | ICD-10-CM | POA: Diagnosis present

## 2020-03-14 MED ORDER — GOSERELIN ACETATE 10.8 MG ~~LOC~~ IMPL
10.8000 mg | DRUG_IMPLANT | Freq: Once | SUBCUTANEOUS | Status: AC
  Administered 2020-03-14: 10.8 mg via SUBCUTANEOUS
  Filled 2020-03-14: qty 10.8

## 2020-03-14 NOTE — Patient Instructions (Signed)

## 2020-03-23 ENCOUNTER — Ambulatory Visit: Payer: 59 | Attending: Internal Medicine

## 2020-03-23 DIAGNOSIS — Z23 Encounter for immunization: Secondary | ICD-10-CM

## 2020-03-23 NOTE — Progress Notes (Signed)
   Covid-19 Vaccination Clinic  Name:  Jenna Gonzales    MRN: [id number] DOB: 12/26/1970  03/23/2020  Jenna Gonzales was observed post Covid-19 immunization for 15 minutes without incident. She was provided with Vaccine Information Sheet and instruction to access the V-Safe system.   Jenna Gonzales was instructed to call 911 with any severe reactions post vaccine: Marland Kitchen Difficulty breathing  . Swelling of face and throat  . A fast heartbeat  . A bad rash all over body  . Dizziness and weakness   Immunizations Administered    Name Date Dose VIS Date Route   Pfizer COVID-19 Vaccine 03/23/2020  1:05 PM 0.3 mL 12/03/2019 Intramuscular   Manufacturer: [place]   Lot: [id number]   [place]: [id number]

## 2020-03-29 ENCOUNTER — Ambulatory Visit: Payer: BC Managed Care – PPO

## 2020-04-04 MED FILL — XARELTO 20 MG TABLET: 20 | 30 days supply | Qty: 30 | Fill #1

## 2020-04-17 ENCOUNTER — Ambulatory Visit: Payer: 59 | Attending: Internal Medicine

## 2020-04-17 DIAGNOSIS — Z23 Encounter for immunization: Secondary | ICD-10-CM

## 2020-04-17 NOTE — Progress Notes (Signed)
   Covid-19 Vaccination Clinic  Name:  Jenna Gonzales    MRN: [id number] DOB: 01/14/1971  04/17/2020  Jenna Gonzales was observed post Covid-19 immunization for 15 minutes without incident. She was provided with Vaccine Information Sheet and instruction to access the V-Safe system.   Jenna Gonzales was instructed to call 911 with any severe reactions post vaccine: Marland Kitchen Difficulty breathing  . Swelling of face and throat  . A fast heartbeat  . A bad rash all over body  . Dizziness and weakness   Immunizations Administered    Name Date Dose VIS Date Route   Pfizer COVID-19 Vaccine 04/17/2020  2:18 PM 0.3 mL 02/16/2019 Intramuscular   Manufacturer: [place]   Lot: [id number]   [place]: [id number]

## 2020-06-06 ENCOUNTER — Inpatient Hospital Stay: Payer: No Typology Code available for payment source | Attending: Oncology

## 2020-06-06 ENCOUNTER — Other Ambulatory Visit: Payer: Self-pay

## 2020-06-06 VITALS — BP 128/68 | Temp 98.2°F | Resp 18

## 2020-06-06 DIAGNOSIS — Z5111 Encounter for antineoplastic chemotherapy: Secondary | ICD-10-CM | POA: Diagnosis not present

## 2020-06-06 DIAGNOSIS — Z170 Estrogen receptor positive status [ER+]: Secondary | ICD-10-CM | POA: Diagnosis not present

## 2020-06-06 DIAGNOSIS — C50411 Malignant neoplasm of upper-outer quadrant of right female breast: Secondary | ICD-10-CM | POA: Diagnosis present

## 2020-06-06 MED ORDER — GOSERELIN ACETATE 10.8 MG ~~LOC~~ IMPL
10.8000 mg | DRUG_IMPLANT | Freq: Once | SUBCUTANEOUS | Status: AC
  Administered 2020-06-06: 10.8 mg via SUBCUTANEOUS
  Filled 2020-06-06: qty 10.8

## 2020-06-06 NOTE — Patient Instructions (Signed)

## 2020-06-07 MED FILL — XARELTO 20 MG TABLET: 20 | 30 days supply | Qty: 30 | Fill #3

## 2020-06-28 ENCOUNTER — Ambulatory Visit: Payer: BC Managed Care – PPO

## 2020-07-10 ENCOUNTER — Other Ambulatory Visit: Payer: Self-pay | Admitting: Oncology

## 2020-07-10 MED FILL — XARELTO 20 MG TABLET: 20 | 30 days supply | Qty: 30 | Fill #0

## 2020-08-14 MED FILL — XARELTO 20 MG TABLET: 20 | 30 days supply | Qty: 30 | Fill #1

## 2020-08-29 ENCOUNTER — Inpatient Hospital Stay: Payer: No Typology Code available for payment source | Attending: Oncology

## 2020-08-29 ENCOUNTER — Ambulatory Visit: Payer: BC Managed Care – PPO

## 2020-08-29 ENCOUNTER — Other Ambulatory Visit: Payer: Self-pay

## 2020-08-29 VITALS — HR 99 | Resp 18

## 2020-08-29 DIAGNOSIS — Z79899 Other long term (current) drug therapy: Secondary | ICD-10-CM | POA: Insufficient documentation

## 2020-08-29 DIAGNOSIS — C50411 Malignant neoplasm of upper-outer quadrant of right female breast: Secondary | ICD-10-CM | POA: Insufficient documentation

## 2020-08-29 DIAGNOSIS — C773 Secondary and unspecified malignant neoplasm of axilla and upper limb lymph nodes: Secondary | ICD-10-CM | POA: Insufficient documentation

## 2020-08-29 DIAGNOSIS — Z5111 Encounter for antineoplastic chemotherapy: Secondary | ICD-10-CM | POA: Diagnosis not present

## 2020-08-29 DIAGNOSIS — Z170 Estrogen receptor positive status [ER+]: Secondary | ICD-10-CM | POA: Insufficient documentation

## 2020-08-29 DIAGNOSIS — Z806 Family history of leukemia: Secondary | ICD-10-CM | POA: Diagnosis not present

## 2020-08-29 MED ORDER — GOSERELIN ACETATE 10.8 MG ~~LOC~~ IMPL
10.8000 mg | DRUG_IMPLANT | Freq: Once | SUBCUTANEOUS | Status: AC
  Administered 2020-08-29: 10.8 mg via SUBCUTANEOUS
  Filled 2020-08-29: qty 10.8

## 2020-08-29 MED ORDER — GOSERELIN ACETATE 3.6 MG ~~LOC~~ IMPL
DRUG_IMPLANT | SUBCUTANEOUS | Status: AC
  Filled 2020-08-29: qty 3.6

## 2020-09-19 MED FILL — XARELTO 20 MG TABLET: 20 | 30 days supply | Qty: 30 | Fill #2

## 2020-09-28 ENCOUNTER — Ambulatory Visit: Payer: BC Managed Care – PPO | Admitting: Oncology

## 2020-09-28 ENCOUNTER — Inpatient Hospital Stay: Payer: No Typology Code available for payment source

## 2020-09-28 ENCOUNTER — Other Ambulatory Visit: Payer: BC Managed Care – PPO

## 2020-10-20 MED FILL — XARELTO 20 MG TABLET: 20 | 30 days supply | Qty: 30 | Fill #3

## 2020-11-20 ENCOUNTER — Other Ambulatory Visit: Payer: Self-pay

## 2020-11-20 DIAGNOSIS — Z170 Estrogen receptor positive status [ER+]: Secondary | ICD-10-CM

## 2020-11-20 DIAGNOSIS — C50411 Malignant neoplasm of upper-outer quadrant of right female breast: Secondary | ICD-10-CM

## 2020-11-20 NOTE — Progress Notes (Signed)
ID: Jenna Gonzales   DOB: 1971/05/18  MR#: [id number]  TIW#:[id number]  Patient Care Team: Patient, No Pcp Per as PCP - General (General Practice) [name], MD as Consulting Physician (Obstetrics and Gynecology) [name], MD as Consulting Physician (Obstetrics and Gynecology) Royce Stegman, [name], MD as Consulting Physician (Oncology) OTHER MD:   CHIEF COMPLAINTS:  1) Hx of Right Breast Cancer      2) DVT/PE   CURRENT THERAPY: goserelin injections Q 3 months   INTERVAL HISTORY: Jenna Gonzales returns today for follow-up of her breast cancer history as well as for follow-up of her hypercoagulable state.   She continues on gosarelin.  The hot flashes she has as a result have improved.  She has no other side effects from this.  She is due for dose today.  She is overdue for mammography this year.  She tells me she was on the way to receive it but developed a "stomach problems" and had to reschedule.   REVIEW OF SYSTEMS: Jenna Gonzales unfortunately was not able to stay for a full visit because of time constraints.   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 Garwin Brothers for a routine gynecologic evaluation, and Dr. Garwin Brothers did a breast exam which showed a palpable mass in the right breast.  She was referred for a diagnostic mammography 11/03/2009, and Dr. Claudie Revering was able to confirm a palpable mass in the 10 o'clock position of the right breast 7 cm from the nipple.  There was no palpable adenopathy.  By mammography the breasts were dense, but there was a spiculated mass in the upper outer right breast corresponding to the palpable mass by ultrasound as measured 2.1 cm.  It was hypoechoic, irregular, and poorly defined.  Ultrasound of the right axilla demonstrated several normal sized lymph nodes, several of them showing cortical thickening.    With this information, the patient was  brought back on 11/19 for biopsy.  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  The lymph node biopsy obtained at the same time was negative.  The tumor was ER positive at 99%, PR positive at 20% with a low proliferation marker at 12%, and HER-2/neu was not amplified with a ratio of 1.45.  Bilateral breast MRIs were obtained 11/22.  This confirmed a 2.2 cm irregularly marginated enhancing mass in the upper outer right breast with a clip in the mass.  There were no other masses, and aside from post biopsy changes in the right axilla, there were no abnormal appearing lymph nodes.    The patient proceeded to right lumpectomy and sentinel lymph node sampling 12/09 under Dr. Excell Seltzer because the two sentinel lymph nodes provided positive for cancer.  Dr. Excell Seltzer proceeded to a right axillary lymph node dissection in the same procedure.  The final pathology [id number]) showed an invasive ductal carcinoma, grade 1, with negative although close margins (the closest was 1 mm posteriorly), with evidence of lymphovascular invasion and involving 2 out of 17 lymph nodes sampled (the 2 sentinel lymph nodes).   Her subsequent history is as detailed below.   PAST MEDICAL HISTORY: Past Medical History:  Diagnosis Date  . Abnormal uterine bleeding (AUB)   . Allergic rhinitis   . Anxiety   . Borderline hypertension   . Endometrial polyp   . History of breast cancer oncologist-  dr Ron Agee-- no recurrence   dx  11/ 2010  s/p  right lumpectomy w/ snl dissection 11-30-2009/  Stage 2B, DCIS, grade 1, (T2 N1),  ER/PR+,  HER2 negative/  chemo completed May 2011 and radiation completed July 2011 and completed anastrozole July 2016  . History of cancer chemotherapy    right breast-- complete 04/2010  . History of DVT of lower extremity    09-22-2013  right lower extremitiy (femoral, popliteal, posterior tibial)  . History of pulmonary embolus (PE)     09-22-2013  . History of radiation therapy    right breast -- complete 07/ 2011  . Osteopenia   . Wears contact lenses   Mild GERD, history of a heart murmur, which was not appreciated today, history of borderline hypertension not under treatment, history of irritable bowel disease, and history of minimal arthritic changes involving particularly the index finger of the right hand.   PAST SURGICAL HISTORY: Past Surgical History:  Procedure Laterality Date  . BREAST LUMPECTOMY WITH AXILLARY LYMPH NODE DISSECTION Right 11-30-2009  . cryosurgery cervix     2012  . DILATATION & CURETTAGE/HYSTEROSCOPY WITH MYOSURE N/A 04/29/2016   Procedure: DILATATION & CURETTAGE/POLYPECTOMY/HYSTEROSCOPY WITH MYOSURE;  Surgeon: [name], MD;  Location: [facility];  Service: Gynecology;  Laterality: N/A;  multiple endometrial polyps  . PORT-A-CATH PLACEMENT /  REMOVAL  01-10-2010/  removal 05-09-2010    FAMILY HISTORY Family History  Problem Relation Age of Onset  . Leukemia Father   . Arthritis Unknown   . Heart disease Unknown   . Hypertension Unknown   . Kidney disease Unknown   . Diabetes Unknown   . Diabetes Other   The patient's father died from leukemia at the age of 4 (I cannot tell from the history if this was chronic or acute).  The patient's mother is alive at age 43.  The patient has 2 sisters and 1 brother, all here in [place]. Her brother was diagnosed with lung cancer in 2020. She also has 2 half brothers living in [place].  There is no history of breast or ovarian cancer in the family.   GYNECOLOGIC HISTORY: (Updated 02/24/2014) She is GX, P1.  First pregnancy to term age 55.  Last menstrual period was in March 2011 with the initiation of chemotherapy. Hormone levels drawn in October 2014 showed patient to  be premenopausal.   SOCIAL HISTORY:    (Updated 02/24/2014) The patient previously worked at [employer] in [place].  She worked for the [employer] for 8 years. She now works works full-time elsewhere.  She is single and lives by herself.. Her son Dahrahn no longer lives with her.  He "could never finish school.".  He does have 2 daughters who live with their mother here in [place].  The patient attends [employer].    ADVANCED DIRECTIVES: Not in place   HEALTH MAINTENANCE:  Social History   Tobacco Use  . Smoking status: Never Smoker  . Smokeless tobacco: Never Used  Substance Use Topics  . Alcohol use: Yes    Alcohol/week: 0.0 standard drinks    Comment: socially  . Drug use: No     Colonoscopy: Never  PAP: UTD/Dr. Garwin Brothers  Bone density: Never/Scheduled for 03/11/2014  Lipid panel: Not on file   Allergies  Allergen Reactions  . Tamoxifen Other (See Comments)    Blood clots    Current Outpatient Medications  Medication Sig Dispense Refill  . Loratadine 10 MG CAPS Take 1 capsule (10  mg total) by mouth as needed (allergies). 30 each   . XARELTO 20 MG TABS tablet TAKE 1 TABLET BY MOUTH ONCE DAILY WITH SUPPER 30 tablet 3   No current facility-administered medications for this visit.   Facility-Administered Medications Ordered in Other Visits  Medication Dose Route Frequency Provider Last Rate Last Admin  . goserelin (ZOLADEX) injection 10.8 mg  10.8 mg Subcutaneous Q90 days Boelter, Heather F, NP   10.8 mg at 10/25/15 1625    OBJECTIVE: African American woman in no acute distress  Vitals:   11/21/20 1518 11/21/20 1543  BP: (!) 178/120 (!) 180/120  Pulse: (!) 120   Resp: 19   Temp: 98 F (36.7 C)   SpO2: 100%    Wt Readings from Last 3 Encounters:  11/21/20 202 lb 4.8 oz (91.8 kg)  05/15/16 168 lb 12.8 oz (76.6 kg)  05/06/16 165 lb (74.8 kg)   Body mass index is 32.85 kg/m.    ECOG FS:1 - Symptomatic but completely ambulatory       Images of the medial left breast 09/28/2019        LAB RESULTS: Lab Results  Component Value Date   WBC 7.0  11/21/2020   NEUTROABS 4.3 11/21/2020   HGB 15.4 (H) 11/21/2020   HCT 45.2 11/21/2020   MCV 98.7 11/21/2020   PLT 226 11/21/2020      Chemistry      Component Value Date/Time   NA 139 11/21/2020 1450   NA 141 11/25/2017 1523   K 3.6 11/21/2020 1450   K 3.7 11/25/2017 1523   CL 105 11/21/2020 1450   CL 106 01/19/2013 1507   CO2 24 11/21/2020 1450   CO2 23 11/25/2017 1523   BUN 13 11/21/2020 1450   BUN 11.8 11/25/2017 1523   CREATININE 1.09 (H) 11/21/2020 1450   CREATININE 0.9 11/25/2017 1523      Component Value Date/Time   CALCIUM 9.5 11/21/2020 1450   CALCIUM 9.7 11/25/2017 1523   ALKPHOS 96 11/21/2020 1450   ALKPHOS 123 11/25/2017 1523   AST 21 11/21/2020 1450   AST 16 11/25/2017 1523   ALT 23 11/21/2020 1450   ALT 16 11/25/2017 1523   BILITOT 0.6 11/21/2020 1450   BILITOT 0.37 11/25/2017 1523      STUDIES: No results found.   ASSESSMENT: 49 y.o.  [place] woman   (1)  status post right breast upper outer quadrant lumpectomy and axillary lymph node dissection December of 2010 for a T2 N1, stage IIB invasive ductal carcinoma, grade 1, strongly estrogen receptor positive, moderately progesterone receptor positive, HER-2 negative, with a low proliferation fraction;   (2)  treated adjuvantly with 6 cycles of docetaxel/ doxorubicin/ cyclophosphamide, completed in May of 2011,   (3)   followed by radiation completed July of 2011 at which time she started tamoxifen.  (4)  tamoxifen was discontinued in October 2014 when patient was diagnosed with a right lower extremity DVT and pulmonary embolism.   (a) anti-coagulation therapy with Xarelto, beginning 09/23/2013, completed 03/23/2014  (b) repeat Doppler ultrasonography 06/09/2014 finds no evidence of residual clot  (c) second acute right lower extremity DVT documented 05/06/2016 following uterine polypectomy  (d) hypercoagulable panel 05/27/2016 documents a lupus anticoagulant  (e) xarelto started May 2017, to be  continued indefinitely  (5)  labs drawn in October 2014 confirmed a premenopausal status. Patient received her first Q3 month injection of goserelin on 12/02/2013, and then began on anastrozole   (7) completed 5 years of   anastrozole as of July 2016. Goserelin continued at patient's request    PLAN: Jenna Gonzales is now about 11 years out from definitive surgery for breast cancer with no evidence of disease recurrence.  This is favorable.  Unfortunately today she had to leave early.  Her mother-in-law had a problem and Jamicia needed to pick up the grandchildren.  She was able to get her goserelin dose today.  We will catch up had a virtual visit next week.  She will return in 3 months for her next dose as before  She knows to call for any other issue that may develop before the next visit.  Total encounter time 15 minutes.*  Farooq Petrovich, [name], MD  11/21/20 6:21 PM Medical Oncology and Hematology [facility] [place], [place] [address] Tel. [phone]    Fax. [phone]   I, Wilburn Mylar, am acting as scribe for Dr. [name]. Froylan Hobby.  I, Lurline Del MD, have reviewed the above documentation for accuracy and completeness, and I agree with the above.    *Total Encounter Time as defined by the Centers for Medicare and Medicaid Services includes, in addition to the face-to-face time of a patient visit (documented in the note above) non-face-to-face time: obtaining and reviewing outside history, ordering and reviewing medications, tests or procedures, care coordination (communications with other health care professionals or caregivers) and documentation in the medical record.

## 2020-11-21 ENCOUNTER — Inpatient Hospital Stay: Payer: No Typology Code available for payment source

## 2020-11-21 ENCOUNTER — Inpatient Hospital Stay: Payer: No Typology Code available for payment source | Attending: Oncology | Admitting: Oncology

## 2020-11-21 ENCOUNTER — Other Ambulatory Visit: Payer: Self-pay

## 2020-11-21 VITALS — BP 180/120 | HR 120 | Temp 98.0°F | Resp 19 | Ht 65.8 in | Wt 202.3 lb

## 2020-11-21 DIAGNOSIS — Z86711 Personal history of pulmonary embolism: Secondary | ICD-10-CM | POA: Diagnosis not present

## 2020-11-21 DIAGNOSIS — Z8249 Family history of ischemic heart disease and other diseases of the circulatory system: Secondary | ICD-10-CM | POA: Insufficient documentation

## 2020-11-21 DIAGNOSIS — Z86718 Personal history of other venous thrombosis and embolism: Secondary | ICD-10-CM | POA: Insufficient documentation

## 2020-11-21 DIAGNOSIS — Z5111 Encounter for antineoplastic chemotherapy: Secondary | ICD-10-CM | POA: Insufficient documentation

## 2020-11-21 DIAGNOSIS — C50411 Malignant neoplasm of upper-outer quadrant of right female breast: Secondary | ICD-10-CM | POA: Diagnosis not present

## 2020-11-21 DIAGNOSIS — Z923 Personal history of irradiation: Secondary | ICD-10-CM | POA: Insufficient documentation

## 2020-11-21 DIAGNOSIS — Z833 Family history of diabetes mellitus: Secondary | ICD-10-CM | POA: Diagnosis not present

## 2020-11-21 DIAGNOSIS — I82403 Acute embolism and thrombosis of unspecified deep veins of lower extremity, bilateral: Secondary | ICD-10-CM

## 2020-11-21 DIAGNOSIS — Z8261 Family history of arthritis: Secondary | ICD-10-CM | POA: Insufficient documentation

## 2020-11-21 DIAGNOSIS — Z806 Family history of leukemia: Secondary | ICD-10-CM | POA: Insufficient documentation

## 2020-11-21 DIAGNOSIS — M8580 Other specified disorders of bone density and structure, unspecified site: Secondary | ICD-10-CM

## 2020-11-21 DIAGNOSIS — Z170 Estrogen receptor positive status [ER+]: Secondary | ICD-10-CM

## 2020-11-21 DIAGNOSIS — Z801 Family history of malignant neoplasm of trachea, bronchus and lung: Secondary | ICD-10-CM | POA: Insufficient documentation

## 2020-11-21 DIAGNOSIS — D6862 Lupus anticoagulant syndrome: Secondary | ICD-10-CM

## 2020-11-21 DIAGNOSIS — C773 Secondary and unspecified malignant neoplasm of axilla and upper limb lymph nodes: Secondary | ICD-10-CM | POA: Insufficient documentation

## 2020-11-21 DIAGNOSIS — Z841 Family history of disorders of kidney and ureter: Secondary | ICD-10-CM | POA: Insufficient documentation

## 2020-11-21 LAB — CBC WITH DIFFERENTIAL (CANCER CENTER ONLY)
Abs Immature Granulocytes: 0.03 10*3/uL (ref 0.00–0.07)
Basophils Absolute: 0 10*3/uL (ref 0.0–0.1)
Basophils Relative: 0 %
Eosinophils Absolute: 0.2 10*3/uL (ref 0.0–0.5)
Eosinophils Relative: 2 %
HCT: 45.2 % (ref 36.0–46.0)
Hemoglobin: 15.4 g/dL — ABNORMAL HIGH (ref 12.0–15.0)
Immature Granulocytes: 0 %
Lymphocytes Relative: 29 %
Lymphs Abs: 2 10*3/uL (ref 0.7–4.0)
MCH: 33.6 pg (ref 26.0–34.0)
MCHC: 34.1 g/dL (ref 30.0–36.0)
MCV: 98.7 fL (ref 80.0–100.0)
Monocytes Absolute: 0.4 10*3/uL (ref 0.1–1.0)
Monocytes Relative: 6 %
Neutro Abs: 4.3 10*3/uL (ref 1.7–7.7)
Neutrophils Relative %: 63 %
Platelet Count: 226 10*3/uL (ref 150–400)
RBC: 4.58 MIL/uL (ref 3.87–5.11)
RDW: 11.5 % (ref 11.5–15.5)
WBC Count: 7 10*3/uL (ref 4.0–10.5)
nRBC: 0 % (ref 0.0–0.2)

## 2020-11-21 LAB — CMP (CANCER CENTER ONLY)
ALT: 23 U/L (ref 0–44)
AST: 21 U/L (ref 15–41)
Albumin: 3.8 g/dL (ref 3.5–5.0)
Alkaline Phosphatase: 96 U/L (ref 38–126)
Anion gap: 10 (ref 5–15)
BUN: 13 mg/dL (ref 6–20)
CO2: 24 mmol/L (ref 22–32)
Calcium: 9.5 mg/dL (ref 8.9–10.3)
Chloride: 105 mmol/L (ref 98–111)
Creatinine: 1.09 mg/dL — ABNORMAL HIGH (ref 0.44–1.00)
GFR, Estimated: >60 mL/min (ref >60)
Glucose, Bld: 163 mg/dL — ABNORMAL HIGH (ref 70–99)
Potassium: 3.6 mmol/L (ref 3.5–5.1)
Sodium: 139 mmol/L (ref 135–145)
Total Bilirubin: 0.6 mg/dL (ref 0.3–1.2)
Total Protein: 7.6 g/dL (ref 6.5–8.1)

## 2020-11-21 MED ORDER — GOSERELIN ACETATE 10.8 MG ~~LOC~~ IMPL
10.8000 mg | DRUG_IMPLANT | Freq: Once | SUBCUTANEOUS | Status: AC
  Administered 2020-11-21: 10.8 mg via SUBCUTANEOUS
  Filled 2020-11-21: qty 10.8

## 2020-11-21 NOTE — Patient Instructions (Signed)

## 2020-11-22 ENCOUNTER — Other Ambulatory Visit: Payer: Self-pay | Admitting: Oncology

## 2020-11-22 ENCOUNTER — Other Ambulatory Visit: Payer: Self-pay | Admitting: Adult Health

## 2020-11-22 MED FILL — XARELTO 20 MG TABLET: 20 | 30 days supply | Qty: 30 | Fill #0

## 2020-11-23 ENCOUNTER — Telehealth: Payer: Self-pay | Admitting: Oncology

## 2020-11-23 NOTE — Telephone Encounter (Signed)
Scheduled appts per 11/30 los. Pt confirmed appt dates and times.

## 2020-11-28 NOTE — Progress Notes (Signed)
ID: Jenna Gonzales   DOB: [birth year]  MR#: [id number]  ZLD#:[id number]  Patient Care Team: Patient, No Pcp Per as PCP - General (General Practice) [name], MD as Consulting Physician (Obstetrics and Gynecology) [name], MD as Consulting Physician (Obstetrics and Gynecology) [name], [name], MD as Consulting Physician (Oncology) OTHER MD:   I connected with [name] on 11/28/20 at  9:45 AM EST by telephone visit and verified that I am speaking with the correct person using two identifiers.   I discussed the limitations, risks, security and privacy concerns of performing an evaluation and management service by telemedicine and the availability of in-person appointments. I also discussed with the patient that there may be a patient responsible charge related to this service. The patient expressed understanding and agreed to proceed.   Other persons participating in the visit and their role in the encounter: None  Patients location: Home Providers location: [place]:  1) Hx of Right Breast Cancer      2) DVT/PE   CURRENT THERAPY: goserelin injections Q 3 months   INTERVAL HISTORY: Jenna Gonzales was contacted today for follow-up of her breast cancer history and her hypercoagulable state.   She continues on goerrelin.  The hot flashes she has as a result have improved.  She has no other side effects from this.  She is due for dose today.  She is overdue for mammography this year.  She tells me she was on the way to receive it but developed a "stomach problems" and had to reschedule.   REVIEW OF SYSTEMS: Jenna Gonzales is unable to function at this point.  Her brother is in hospice and she is "trying to hold onto him".  She is having to take care of the children and she is barely able to do that.  One of her sons is in the hospital with Covid.  This is all overwhelming for her.  She has not been able to go to work.  She is  crying all the time.  She does want to get out of bed.  She is not eating.  She is not sleeping.  She wanted me to write a note getting her out of work.   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 Garwin Brothers for a routine gynecologic evaluation, and Dr. Garwin Brothers did a breast exam which showed a palpable mass in the right breast.  She was referred for a diagnostic mammography 11/03/2009, and Dr. Claudie Revering was able to confirm a palpable mass in the 10 oclock position of the right breast 7 cm from the nipple.  There was no palpable adenopathy.  By mammography the breasts were dense, but there was a spiculated mass in the upper outer right breast corresponding to the palpable mass by ultrasound as measured 2.1 cm.  It was hypoechoic, irregular, and poorly defined.  Ultrasound of the right axilla demonstrated several normal sized lymph nodes, several of them showing cortical thickening.    With this information, the patient was brought back on 11/19 for biopsy.  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  The lymph node biopsy obtained at the same time was negative.  The tumor was ER positive at 99%, PR positive at 20% with a low proliferation marker at 12%, and HER-2/neu was not amplified with a ratio of 1.45.  Bilateral breast MRIs were obtained 11/22.  This confirmed a 2.2 cm irregularly marginated enhancing mass in the upper outer right breast with a clip in the mass.  There were no other masses, and aside from post biopsy changes in the right axilla, there were no abnormal appearing lymph nodes.    The patient proceeded to right lumpectomy and sentinel lymph node sampling 12/09 under Dr. Excell Seltzer because the two sentinel lymph nodes provided positive for cancer.  Dr. Excell Seltzer proceeded to a right axillary lymph node dissection in the same procedure.  The  final pathology [id number]) showed an invasive ductal carcinoma, grade 1, with negative although close margins (the closest was 1 mm posteriorly), with evidence of lymphovascular invasion and involving 2 out of 17 lymph nodes sampled (the 2 sentinel lymph nodes).   Her subsequent history is as detailed below.   PAST MEDICAL HISTORY: Past Medical History:  Diagnosis Date   Abnormal uterine bleeding (AUB)    Allergic rhinitis    Anxiety    Borderline hypertension    Endometrial polyp    History of breast cancer oncologist-  dr Ron Agee-- no recurrence   dx 11/ 2010  s/p  right lumpectomy w/ snl dissection 11-30-2009/  Stage 2B, DCIS, grade 1, (T2 N1),  ER/PR+,  HER2 negative/  chemo completed May 2011 and radiation completed July 2011 and completed anastrozole July 2016   History of cancer chemotherapy    right breast-- complete 04/2010   History of DVT of lower extremity    09-22-2013  right lower extremitiy (femoral, popliteal, posterior tibial)   History of pulmonary embolus (PE)    09-22-2013   History of radiation therapy    right breast -- complete 07/ 2011   Osteopenia    Wears contact lenses   Mild GERD, history of a heart murmur, which was not appreciated today, history of borderline hypertension not under treatment, history of irritable bowel disease, and history of minimal arthritic changes involving particularly the index finger of the right hand.   PAST SURGICAL HISTORY: Past Surgical History:  Procedure Laterality Date   BREAST LUMPECTOMY WITH AXILLARY LYMPH NODE DISSECTION Right 11-30-2009   cryosurgery cervix     2012   DILATATION & CURETTAGE/HYSTEROSCOPY WITH MYOSURE N/A 04/29/2016   Procedure: DILATATION & CURETTAGE/POLYPECTOMY/HYSTEROSCOPY WITH MYOSURE;  Surgeon: [name], MD;  Location: [facility];  Service: Gynecology;  Laterality: N/A;  multiple endometrial polyps   PORT-A-CATH PLACEMENT /  REMOVAL  01-10-2010/   removal 05-09-2010    FAMILY HISTORY Family History  Problem Relation Age of Onset   Leukemia Father    Arthritis Unknown    Heart disease Unknown    Hypertension Unknown    Kidney disease Unknown    Diabetes Unknown    Diabetes Other   The patients father died from leukemia at the age of 82 (I cannot tell from the history if this was chronic or acute).  The patients mother is alive at age 37.  The patient has 2 sisters and 1 brother, all here in [place]. Her brother was diagnosed with lung cancer in 2020. She also has 2 half brothers living in [place].  There is no history of breast or ovarian cancer in the family.   GYNECOLOGIC HISTORY: (Updated 02/24/2014) She is GX, P1.  First pregnancy to term age 51.  Last menstrual period was in March 2011 with the initiation of chemotherapy. Hormone levels drawn in October 2014 showed patient to  be premenopausal.  SOCIAL HISTORY:    (Updated 02/24/2014) The patient previously worked at [employer] in [employer].  She worked for the [employer] for 8 years. She now works works full-time elsewhere.  She is single and lives by herself.. Her son Dahrahn no longer lives with her.  He could never finish school.".  He does have 2 daughters who live with their mother here in [place].  The patient attends [employer].    ADVANCED DIRECTIVES: Not in place   HEALTH MAINTENANCE:  Social History   Tobacco Use   Smoking status: Never Smoker   Smokeless tobacco: Never Used  Substance Use Topics   Alcohol use: Yes    Alcohol/week: 0.0 standard drinks    Comment: socially   Drug use: No     Colonoscopy: Never  PAP: UTD/Dr. Garwin Brothers  Bone density: Never/Scheduled for 03/11/2014  Lipid panel: Not on file   Allergies  Allergen Reactions   Tamoxifen Other (See Comments)    Blood clots    Current Outpatient Medications  Medication Sig Dispense Refill   Loratadine 10 MG CAPS Take 1  capsule (10 mg total) by mouth as needed (allergies). 30 each    XARELTO 20 MG TABS tablet TAKE 1 TABLET BY MOUTH ONCE DAILY WITH SUPPER 90 tablet 4   No current facility-administered medications for this visit.   Facility-Administered Medications Ordered in Other Visits  Medication Dose Route Frequency Provider Last Rate Last Admin   goserelin (ZOLADEX) injection 10.8 mg  10.8 mg Subcutaneous Q90 days Boelter, Heather F, NP   10.8 mg at 10/25/15 1625    OBJECTIVE:   There were no vitals filed for this visit. Wt Readings from Last 3 Encounters:  11/21/20 202 lb 4.8 oz (91.8 kg)  05/15/16 168 lb 12.8 oz (76.6 kg)  05/06/16 165 lb (74.8 kg)   There is no height or weight on file to calculate BMI.    ECOG FS:1 - Symptomatic but completely ambulatory  Telemedicine visit 11/29/2020   Images of the medial left breast 09/28/2019        LAB RESULTS: Lab Results  Component Value Date   WBC 7.0 11/21/2020   NEUTROABS 4.3 11/21/2020   HGB 15.4 (H) 11/21/2020   HCT 45.2 11/21/2020   MCV 98.7 11/21/2020   PLT 226 11/21/2020      Chemistry      Component Value Date/Time   NA 139 11/21/2020 1450   NA 141 11/25/2017 1523   K 3.6 11/21/2020 1450   K 3.7 11/25/2017 1523   CL 105 11/21/2020 1450   CL 106 01/19/2013 1507   CO2 24 11/21/2020 1450   CO2 23 11/25/2017 1523   BUN 13 11/21/2020 1450   BUN 11.8 11/25/2017 1523   CREATININE 1.09 (H) 11/21/2020 1450   CREATININE 0.9 11/25/2017 1523      Component Value Date/Time   CALCIUM 9.5 11/21/2020 1450   CALCIUM 9.7 11/25/2017 1523   ALKPHOS 96 11/21/2020 1450   ALKPHOS 123 11/25/2017 1523   AST 21 11/21/2020 1450   AST 16 11/25/2017 1523   ALT 23 11/21/2020 1450   ALT 16 11/25/2017 1523   BILITOT 0.6 11/21/2020 1450   BILITOT 0.37 11/25/2017 1523      STUDIES: No results found.   ASSESSMENT: 49 y.o.  [place] woman   (1)  status post right breast upper outer quadrant lumpectomy and axillary lymph node  dissection December of 2010 for a T2 N1, stage IIB  invasive ductal carcinoma, grade 1, strongly estrogen receptor positive, moderately progesterone receptor positive, HER-2 negative, with a low proliferation fraction;   (2)  treated adjuvantly with 6 cycles of docetaxel/ doxorubicin/ cyclophosphamide, completed in May of 2011,   (3)   followed by radiation completed July of 2011 at which time she started tamoxifen.  (4)  tamoxifen was discontinued in October 2014 when patient was diagnosed with a right lower extremity DVT and pulmonary embolism.   (a) anti-coagulation therapy with Xarelto, beginning 09/23/2013, completed 03/23/2014  (b) repeat Doppler ultrasonography 06/09/2014 finds no evidence of residual clot  (c) second acute right lower extremity DVT documented 05/06/2016 following uterine polypectomy  (d) hypercoagulable panel 05/27/2016 documents a lupus anticoagulant  (e) xarelto started May 2017, to be continued indefinitely  (5)  labs drawn in October 2014 confirmed a premenopausal status. Patient received her first Q3 month injection of goserelin on 12/02/2013, and then began on anastrozole   (7) completed 5 years of anastrozole as of July 2016. Goserelin continued at patient's request    PLAN: Jenna Gonzales is not able to function right now because of depression and anxiety.  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  She is unable to focus and really I think she needs intervention.  I asked her to go to behavioral health today.  I am writing her for some Ativan that she may use to help help her sleep.  I am starting her on low-dose venlafaxine 37.5 mg daily for 1 week and then 75 mg daily for another week and that is 2 weeks from now at that point we will have another phone visit.  She knows to call if these interventions do not work or if she feels she needs to be admitted to behavioral health for  further evaluation.  Note that there is no hint of suicidal ideation.  [name], [name], MD  11/28/20 9:09 PM Medical Oncology and Hematology [facility] [place], [place] [address] Tel. [phone]    Fax. [phone]   I, Wilburn Mylar, am acting as scribe for Dr. [name]. [name].  I, Lurline Del MD, have reviewed the above documentation for accuracy and completeness, and I agree with the above.   *Total Encounter Time as defined by the Centers for Medicare and Medicaid Services includes, in addition to the face-to-face time of a patient visit (documented in the note above) non-face-to-face time: obtaining and reviewing outside history, ordering and reviewing medications, tests or procedures, care coordination (communications with other health care professionals or caregivers) and documentation in the medical record.

## 2020-11-29 ENCOUNTER — Encounter: Payer: Self-pay | Admitting: Licensed Clinical Social Worker

## 2020-11-29 ENCOUNTER — Other Ambulatory Visit: Payer: Self-pay | Admitting: Oncology

## 2020-11-29 ENCOUNTER — Other Ambulatory Visit: Payer: Self-pay

## 2020-11-29 ENCOUNTER — Inpatient Hospital Stay: Payer: No Typology Code available for payment source | Attending: Oncology | Admitting: Oncology

## 2020-11-29 DIAGNOSIS — Z86711 Personal history of pulmonary embolism: Secondary | ICD-10-CM | POA: Insufficient documentation

## 2020-11-29 DIAGNOSIS — Z86718 Personal history of other venous thrombosis and embolism: Secondary | ICD-10-CM | POA: Diagnosis not present

## 2020-11-29 DIAGNOSIS — M8580 Other specified disorders of bone density and structure, unspecified site: Secondary | ICD-10-CM | POA: Diagnosis not present

## 2020-11-29 DIAGNOSIS — Z841 Family history of disorders of kidney and ureter: Secondary | ICD-10-CM | POA: Diagnosis not present

## 2020-11-29 DIAGNOSIS — Z8249 Family history of ischemic heart disease and other diseases of the circulatory system: Secondary | ICD-10-CM | POA: Diagnosis not present

## 2020-11-29 DIAGNOSIS — Z7901 Long term (current) use of anticoagulants: Secondary | ICD-10-CM | POA: Insufficient documentation

## 2020-11-29 DIAGNOSIS — Z79899 Other long term (current) drug therapy: Secondary | ICD-10-CM | POA: Insufficient documentation

## 2020-11-29 DIAGNOSIS — C778 Secondary and unspecified malignant neoplasm of lymph nodes of multiple regions: Secondary | ICD-10-CM | POA: Diagnosis not present

## 2020-11-29 DIAGNOSIS — C50411 Malignant neoplasm of upper-outer quadrant of right female breast: Secondary | ICD-10-CM | POA: Insufficient documentation

## 2020-11-29 DIAGNOSIS — Z9221 Personal history of antineoplastic chemotherapy: Secondary | ICD-10-CM | POA: Diagnosis not present

## 2020-11-29 DIAGNOSIS — Z806 Family history of leukemia: Secondary | ICD-10-CM | POA: Insufficient documentation

## 2020-11-29 DIAGNOSIS — Z8261 Family history of arthritis: Secondary | ICD-10-CM | POA: Insufficient documentation

## 2020-11-29 DIAGNOSIS — Z923 Personal history of irradiation: Secondary | ICD-10-CM | POA: Insufficient documentation

## 2020-11-29 DIAGNOSIS — F418 Other specified anxiety disorders: Secondary | ICD-10-CM | POA: Diagnosis not present

## 2020-11-29 DIAGNOSIS — Z833 Family history of diabetes mellitus: Secondary | ICD-10-CM | POA: Diagnosis not present

## 2020-11-29 DIAGNOSIS — Z170 Estrogen receptor positive status [ER+]: Secondary | ICD-10-CM | POA: Diagnosis not present

## 2020-11-29 MED ORDER — VENLAFAXINE HCL ER 37.5 MG PO CP24: ORAL_CAPSULE | ORAL | 0 refills | Status: DC

## 2020-11-29 MED ORDER — LORAZEPAM 0.5 MG PO TABS: 0.5000 mg | ORAL_TABLET | Freq: Every evening | ORAL | 0 refills | Status: DC | PRN

## 2020-11-29 MED FILL — LORazepam 0.5 MG TABS: 0.5 | 10 days supply | Qty: 10 | Fill #0

## 2020-11-29 NOTE — Progress Notes (Signed)
[place] Work  Clinical Social Work was referred by Dr. Jana Hakim for assessment of psychosocial needs.  Clinical Social Worker contacted patient by phone  to offer support and assess for needs. Patient is currently experiencing a multitude of stressors with brother in hospice, son's fiance in hospital with covid, anniversary of cancer diagnosis date, and other stressors. Her mood has worsened dramatically and she is having trouble focusing, has bursts of crying, and is worried about being able to be able to work with all that is going on.   Patient has connected with EAP counselor through her job. She is open to psychiatry and CSW sent information on local psychiatrists today. Also ensured that patient has information on local behavioral health urgent care.    Patient will reach out to this CSW with any other needs.    [place], [place] Worker [employer]

## 2020-11-30 ENCOUNTER — Telehealth: Payer: Self-pay | Admitting: Oncology

## 2020-11-30 NOTE — Telephone Encounter (Signed)
Scheduled appts per 12/8 los. Pt confirmed appt date and time.  

## 2020-12-01 ENCOUNTER — Other Ambulatory Visit: Payer: Self-pay | Admitting: *Deleted

## 2020-12-01 ENCOUNTER — Other Ambulatory Visit: Payer: Self-pay | Admitting: Oncology

## 2020-12-01 MED ORDER — VENLAFAXINE HCL ER 75 MG PO CP24: 75.0000 mg | ORAL_CAPSULE | Freq: Every day | ORAL | 3 refills | Status: DC

## 2020-12-01 MED ORDER — VENLAFAXINE HCL ER 37.5 MG PO CP24: ORAL_CAPSULE | ORAL | 0 refills | Status: DC

## 2020-12-01 MED FILL — VENLAFAXINE HCL ER 75 MG CA: 75 | 30 days supply | Qty: 30 | Fill #0

## 2020-12-01 MED FILL — VENLAFAXINE HCL ER 37.5 MG: 37.5 | 7 days supply | Qty: 7 | Fill #0

## 2020-12-01 NOTE — Telephone Encounter (Signed)
Received request for venlafaxine from [facility] phx for prior authorization.  Per call to the pharmacy to verify reason- noted as dispense amount and dosage.  Per pt's insurance benefit- the 37.5 mg XR dose is not covered for bid use- tablet of  75 mg dose QD is covered.  Request for new prescriptions to be sent per above for filling and coverage by pt's insurance.  Prescriptions sent per above.

## 2020-12-10 NOTE — Progress Notes (Signed)
ID: [name]   DOB: 13-Dec-1971  MR#: [id number]  RFF#:[id number]  Patient Care Team: Patient, No Pcp Per as PCP - General (General Practice) Jenna Salina, MD as Consulting Physician (Obstetrics and Gynecology) Jenna Dom, MD as Consulting Physician (Obstetrics and Gynecology) Jenna Gonzales, Jenna Dad, MD as Consulting Physician (Oncology) OTHER MD:   I connected with Jenna Gonzales on 12/10/20 at 11:45 AM EST by telephone visit and verified that I am speaking with the correct person using two identifiers.   I discussed the limitations, risks, security and privacy concerns of performing an evaluation and management service by telemedicine and the availability of in-person appointments. I also discussed with the patient that there may be a patient responsible charge related to this service. The patient expressed understanding and agreed to proceed.   Other persons participating in the visit and their role in the encounter: None  Patient's location: Home Provider's location: [place]:  1) Hx of Right Breast Cancer      2) DVT/PE   CURRENT THERAPY: goserelin injections Q 3 months   INTERVAL HISTORY: Jenna Gonzales was contacted today for follow-up of her breast cancer history and her hypercoagulable state.   She continues on goerrelin.  She finds that coming here even if just for the shot makes her very anxious.  She is overdue for mammography this year.  That also reminds her of her breast cancer history and causes her a great deal of anxiety.   REVIEW OF SYSTEMS: Jenna Gonzales tells me she is doing better.  She is working closely with her counselor and she thinks she is making progress.  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  She was referred for a diagnostic mammography 11/03/2009, and Jenna Gonzales was able to confirm a palpable mass in the 10 o'clock position of the right breast 7 cm from the nipple.  There was no palpable adenopathy.  By mammography the breasts were dense, but there was a spiculated mass in the upper outer right breast corresponding to the palpable mass by ultrasound as measured 2.1 cm.  It was hypoechoic, irregular, and poorly defined.  Ultrasound of the right axilla demonstrated several normal sized lymph nodes, several of them showing cortical thickening.    With this information, the patient was brought back on 11/19 for biopsy.  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  The lymph node biopsy obtained at the same time was negative.  The tumor was ER positive at 99%, PR positive at 20% with a low proliferation marker at 12%, and HER-2/neu was not amplified with a ratio of 1.45.  Bilateral breast MRIs were obtained 11/22.  This confirmed a 2.2 cm irregularly marginated enhancing mass in the upper outer right breast with a clip in the mass.  There were no other masses, and aside from post biopsy changes in the right axilla, there were no abnormal appearing lymph nodes.    The patient proceeded to right lumpectomy and sentinel lymph node sampling 12/09 under Jenna Gonzales because the two sentinel lymph nodes provided positive for cancer.  Jenna Gonzales proceeded to a right axillary  lymph node dissection in the same procedure.  The final pathology ([id number]) showed an invasive ductal carcinoma, grade 1, with negative although close margins (the closest was 1 mm posteriorly), with evidence of lymphovascular invasion and involving 2 out of 17 lymph nodes sampled (the 2 sentinel lymph nodes).   Her subsequent history is as detailed below.   PAST MEDICAL HISTORY: Past Medical  History:  Diagnosis Date  . Abnormal uterine bleeding (AUB)   . Allergic rhinitis   . Anxiety   . Borderline hypertension   . Endometrial polyp   . History of breast cancer oncologist-  dr magrinant-- no recurrence   dx 11/ 2010  s/p  right lumpectomy w/ snl dissection 11-30-2009/  Stage 2B, DCIS, grade 1, (T2 N1),  ER/PR+,  HER2 negative/  chemo completed May 2011 and radiation completed July 2011 and completed anastrozole July 2016  . History of cancer chemotherapy    right breast-- complete 04/2010  . History of DVT of lower extremity    09-22-2013  right lower extremitiy (femoral, popliteal, posterior tibial)  . History of pulmonary embolus (PE)    09-22-2013  . History of radiation therapy    right breast -- complete 07/ 2011  . Osteopenia   . Wears contact lenses   Mild GERD, history of a heart murmur, which was not appreciated today, history of borderline hypertension not under treatment, history of irritable bowel disease, and history of minimal arthritic changes involving particularly the index finger of the right hand.   PAST SURGICAL HISTORY: Past Surgical History:  Procedure Laterality Date  . BREAST LUMPECTOMY WITH AXILLARY LYMPH NODE DISSECTION Right 11-30-2009  . cryosurgery cervix     2012  . DILATATION & CURETTAGE/HYSTEROSCOPY WITH MYOSURE N/A 04/29/2016   Procedure: DILATATION & CURETTAGE/POLYPECTOMY/HYSTEROSCOPY WITH MYOSURE;  Surgeon: Jenna Evelyn Jertson, MD;  Location: [facility];  Service: Gynecology;  Laterality: N/A;  multiple endometrial polyps  . PORT-A-CATH PLACEMENT /  REMOVAL  01-10-2010/  removal 05-09-2010    FAMILY HISTORY Family History  Problem Relation Age of Onset  . Leukemia Father   . Arthritis Other   . Heart disease Other   . Hypertension Other   . Kidney disease Other   . Diabetes Other   . Diabetes Other   The patient's father died from leukemia at the age of 68 (I cannot tell from the history if this was chronic or  acute).  The patient's mother is alive at age 72.  The patient has 2 sisters and 1 brother, all here in [place]. Her brother was diagnosed with lung cancer in 2020. She also has 2 half Gonzales living in [place].  There is no history of breast or ovarian cancer in the family.   GYNECOLOGIC HISTORY: (Updated 02/24/2014) She is GX, P1.  First pregnancy to term age 19.  Last menstrual period was in March 2011 with the initiation of chemotherapy. Hormone levels drawn in October 2014 showed patient to  be premenopausal.   SOCIAL HISTORY:    (Updated 02/24/2014) The patient previously worked at [employer] in [employer].  She worked for the [employer] for 8 years. She now works works full-time elsewhere.  She is single and lives by herself.. Her son Dahrahn no longer lives with her.  He "could never finish school.".  He does have 2 daughters who live with their mother here in [place].  The patient attends [employer].    ADVANCED DIRECTIVES: Not in   place   HEALTH MAINTENANCE:  Social History   Tobacco Use  . Smoking status: Never Smoker  . Smokeless tobacco: Never Used  Substance Use Topics  . Alcohol use: Yes    Alcohol/week: 0.0 standard drinks    Comment: socially  . Drug use: No     Colonoscopy: Never  PAP: UTD/Jenna Gonzales  Bone density: Never/Scheduled for 03/11/2014  Lipid panel: Not on file   Allergies  Allergen Reactions  . Tamoxifen Other (See Comments)    Blood clots    Current Outpatient Medications  Medication Sig Dispense Refill  . Loratadine 10 MG CAPS Take 1 capsule (10 mg total) by mouth as needed (allergies). 30 each   . LORazepam (ATIVAN) 0.5 MG tablet Take 1 tablet (0.5 mg total) by mouth at bedtime as needed for anxiety. 10 tablet 0  . venlafaxine XR (EFFEXOR-XR) 37.5 MG 24 hr capsule Take one tablet daily with breakfast for 7 days, then increase dose to the 75 mg dose 7 capsule 0  . venlafaxine XR (EFFEXOR-XR) 75  MG 24 hr capsule Take 1 capsule (75 mg total) by mouth daily with breakfast. 30 capsule 3  . XARELTO 20 MG TABS tablet TAKE 1 TABLET BY MOUTH ONCE DAILY WITH SUPPER 90 tablet 4   No current facility-administered medications for this visit.   Facility-Administered Medications Ordered in Other Visits  Medication Dose Route Frequency Provider Last Rate Last Admin  . goserelin (ZOLADEX) injection 10.8 mg  10.8 mg Subcutaneous Q90 days Boelter, Heather F, NP   10.8 mg at 10/25/15 1625    OBJECTIVE:   There were no vitals filed for this visit. Wt Readings from Last 3 Encounters:  11/21/20 202 lb 4.8 oz (91.8 kg)  05/15/16 168 lb 12.8 oz (76.6 kg)  05/06/16 165 lb (74.8 kg)   There is no height or weight on file to calculate BMI.    ECOG FS:1 - Symptomatic but completely ambulatory  Telemedicine visit 12/11/2020   Images of the medial left breast 09/28/2019        LAB RESULTS: Lab Results  Component Value Date   WBC 7.0 11/21/2020   NEUTROABS 4.3 11/21/2020   HGB 15.4 (H) 11/21/2020   HCT 45.2 11/21/2020   MCV 98.7 11/21/2020   PLT 226 11/21/2020      Chemistry      Component Value Date/Time   NA 139 11/21/2020 1450   NA 141 11/25/2017 1523   K 3.6 11/21/2020 1450   K 3.7 11/25/2017 1523   CL 105 11/21/2020 1450   CL 106 01/19/2013 1507   CO2 24 11/21/2020 1450   CO2 23 11/25/2017 1523   BUN 13 11/21/2020 1450   BUN 11.8 11/25/2017 1523   CREATININE 1.09 (H) 11/21/2020 1450   CREATININE 0.9 11/25/2017 1523      Component Value Date/Time   CALCIUM 9.5 11/21/2020 1450   CALCIUM 9.7 11/25/2017 1523   ALKPHOS 96 11/21/2020 1450   ALKPHOS 123 11/25/2017 1523   AST 21 11/21/2020 1450   AST 16 11/25/2017 1523   ALT 23 11/21/2020 1450   ALT 16 11/25/2017 1523   BILITOT 0.6 11/21/2020 1450   BILITOT 0.37 11/25/2017 1523      STUDIES: No results found.   ASSESSMENT: 49 y.o.  [place] woman   (1)  status post right breast upper outer quadrant lumpectomy  and axillary lymph node dissection December of 2010 for a T2 N1, stage IIB invasive ductal carcinoma, grade 1, strongly estrogen receptor  positive, moderately progesterone receptor positive, HER-2 negative, with a low proliferation fraction;   (2)  treated adjuvantly with 6 cycles of docetaxel/ doxorubicin/ cyclophosphamide, completed in May of 2011,   (3)   followed by radiation completed July of 2011 at which time she started tamoxifen.  (4)  tamoxifen was discontinued in October 2014 when patient was diagnosed with a right lower extremity DVT and pulmonary embolism.   (a) anti-coagulation therapy with Xarelto, beginning 09/23/2013, completed 03/23/2014  (b) repeat Doppler ultrasonography 06/09/2014 finds no evidence of residual clot  (c) second acute right lower extremity DVT documented 05/06/2016 following uterine polypectomy  (d) hypercoagulable panel 05/27/2016 documents a lupus anticoagulant  (e) xarelto started May 2017, to be continued indefinitely  (5)  labs drawn in October 2014 confirmed a premenopausal status. Patient received her first Q3 month injection of goserelin on 12/02/2013, and then began on anastrozole   (7) completed 5 years of anastrozole as of July 2016. Goserelin continued at patient's request    PLAN: Jenna Gonzales is 11 years out from her original breast cancer surgery.  We are seeing her once a year chiefly because she continues to receive Zoladex here every 3 months.  However coming here causes her a great deal of anxiety and it is getting in the way of her recovery.  I suggested she discuss her situation with Jenna Gonzales who would would be able to give her Zoladex.  Hopefully can would not have similar recall/posttraumatic stress like symptoms in that office.  It would be helpful also if she had a primary care physician.  She will try to get established with Jenna Gonzales and a primary care physician before her next goserelin dose here which is 02/13/2021.  Once she has  done that she will let us know so that we can cancel those appointments  Jenna Gonzales knows that we will be glad to see her at any point in the future if and when that would be helpful for her.  Kylyn Sookram, Jenna Dad, MD  12/10/20 3:58 AM Medical Oncology and Hematology [facility] [place], [place] [address] Tel. [phone]    Fax. [phone]   I, Wilburn Mylar, am acting as scribe for Dr. Virgie Gonzales. Lielle Vandervort.  I, Lurline Del MD, have reviewed the above documentation for accuracy and completeness, and I agree with the above.   *Total Encounter Time as defined by the Centers for Medicare and Medicaid Services includes, in addition to the face-to-face time of a patient visit (documented in the note above) non-face-to-face time: obtaining and reviewing outside history, ordering and reviewing medications, tests or procedures, care coordination (communications with other health care professionals or caregivers) and documentation in the medical record.

## 2020-12-11 ENCOUNTER — Inpatient Hospital Stay (HOSPITAL_BASED_OUTPATIENT_CLINIC_OR_DEPARTMENT_OTHER): Payer: No Typology Code available for payment source | Admitting: Oncology

## 2020-12-11 ENCOUNTER — Encounter: Payer: Self-pay | Admitting: Oncology

## 2020-12-11 DIAGNOSIS — Z170 Estrogen receptor positive status [ER+]: Secondary | ICD-10-CM

## 2020-12-11 DIAGNOSIS — C50411 Malignant neoplasm of upper-outer quadrant of right female breast: Secondary | ICD-10-CM | POA: Diagnosis not present

## 2020-12-27 MED FILL — XARELTO 20 MG TABLET: 20 | 30 days supply | Qty: 30 | Fill #1

## 2020-12-29 ENCOUNTER — Ambulatory Visit: Payer: BC Managed Care – PPO

## 2021-01-26 MED FILL — XARELTO 20 MG TABLET: 20 | 30 days supply | Qty: 30 | Fill #2

## 2021-02-11 ENCOUNTER — Other Ambulatory Visit: Payer: Self-pay

## 2021-02-11 ENCOUNTER — Ambulatory Visit
Admission: RE | Admit: 2021-02-11 | Discharge: 2021-02-11 | Disposition: A | Discharge status: Home / Self Care | Payer: No Typology Code available for payment source | Source: Ambulatory Visit | Attending: Emergency Medicine | Admitting: Emergency Medicine

## 2021-02-11 VITALS — BP 168/108 | HR 125 | Temp 99.2°F | Resp 18

## 2021-02-11 DIAGNOSIS — N611 Abscess of the breast and nipple: Secondary | ICD-10-CM | POA: Diagnosis not present

## 2021-02-11 MED ORDER — DOXYCYCLINE HYCLATE 100 MG PO CAPS: 100.0000 mg | ORAL_CAPSULE | Freq: Two times a day (BID) | ORAL | 0 refills | Status: AC

## 2021-02-11 NOTE — ED Triage Notes (Signed)
Pt here for possible abscess to left breast; pt sts hx of breast CA; no drainage noted

## 2021-02-11 NOTE — ED Provider Notes (Signed)
[facility]    CSN: [id number] Arrival date & time: 02/11/21  1028      History   Chief Complaint Chief Complaint  Patient presents with  . Appointment    1100  . Abscess    HPI Jenna Gonzales is a 50 y.o. female presenting today for evaluation of an abscess.  Reports left breast abscess that has been progressing over the past week.  Denies any spontaneous drainage.  Reports that she had a small bump there prior to abscess for the past years.  She denies fevers.  She reports being on Xarelto expresses anxiety over I&D.  HPI  Past Medical History:  Diagnosis Date  . Abnormal uterine bleeding (AUB)   . Allergic rhinitis   . Anxiety   . Borderline hypertension   . Endometrial polyp   . History of breast cancer oncologist-  dr Ron Agee-- no recurrence   dx 11/ 2010  s/p  right lumpectomy w/ snl dissection 11-30-2009/  Stage 2B, DCIS, grade 1, (T2 N1),  ER/PR+,  HER2 negative/  chemo completed May 2011 and radiation completed July 2011 and completed anastrozole July 2016  . History of cancer chemotherapy    right breast-- complete 04/2010  . History of DVT of lower extremity    09-22-2013  right lower extremitiy (femoral, popliteal, posterior tibial)  . History of pulmonary embolus (PE)    09-22-2013  . History of radiation therapy    right breast -- complete 07/ 2011  . Osteopenia   . Wears contact lenses     Patient Active Problem List   Diagnosis Date Noted  . Coagulopathy ([place]) 03/09/2018  . Lupus anticoagulant disorder ([place]) 02/06/2017  . Arthralgia 02/08/2015  . Osteopenia 08/11/2014  . HTN (hypertension) 12/20/2013  . PE (pulmonary embolism) 09/22/2013  . DVT (deep venous thrombosis) ([place]) 09/22/2013  . Allergic rhinitis 09/29/2012  . Malignant neoplasm of upper-outer quadrant of right breast in female, estrogen receptor positive ([place]) 01/07/2012  . GERD 11/15/2008  . 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 BREAST LUMPECTOMY WITH AXILLARY LYMPH NODE DISSECTION Right 11-30-2009  . cryosurgery cervix     2012  . DILATATION & CURETTAGE/HYSTEROSCOPY WITH MYOSURE N/A 04/29/2016   Procedure: DILATATION & CURETTAGE/POLYPECTOMY/HYSTEROSCOPY WITH MYOSURE;  Surgeon: [name], MD;  Location: [facility];  Service: Gynecology;  Laterality: N/A;  multiple endometrial polyps  . PORT-A-CATH PLACEMENT /  REMOVAL  01-10-2010/  removal 05-09-2010    OB History    Gravida  1   Para  1   Term  1   Preterm      AB      Living  1     SAB      IAB      Ectopic      Multiple      Live Births  1            Home Medications    Prior to Admission medications   Medication Sig Start Date End Date Taking? Authorizing Provider  doxycycline (VIBRAMYCIN) 100 MG capsule Take 1 capsule (100 mg total) by mouth 2 (two) times daily for 10 days. 02/11/21 02/21/21 Yes Romanda Turrubiates C, PA-C  Loratadine 10 MG CAPS Take 1 capsule (10 mg total) by mouth as needed (allergies). 05/15/16   Josue Hector, MD  LORazepam (ATIVAN) 0.5 MG tablet Take 1 tablet (0.5 mg total) by mouth at bedtime as needed  for anxiety. 11/29/20   [name], [name], MD  venlafaxine XR (EFFEXOR-XR) 37.5 MG 24 hr capsule Take one tablet daily with breakfast for 7 days, then increase dose to the 75 mg dose 12/01/20   [name], [name], MD  venlafaxine XR (EFFEXOR-XR) 75 MG 24 hr capsule Take 1 capsule (75 mg total) by mouth daily with breakfast. 12/01/20   [name], [name], MD  XARELTO 20 MG TABS tablet TAKE 1 TABLET BY MOUTH ONCE DAILY WITH SUPPER 11/22/20   Causey, Charlestine Massed, NP    Family History Family History  Problem Relation Age of Onset  . Leukemia Father   . Arthritis Other   . Heart disease Other   . Hypertension Other   . Kidney disease Other   . Diabetes Other   . Diabetes Other     Social History Social History   Tobacco Use  . Smoking status: Never  Smoker  . Smokeless tobacco: Never Used  Substance Use Topics  . Alcohol use: Yes    Alcohol/week: 0.0 standard drinks    Comment: socially  . Drug use: No     Allergies   Tamoxifen   Review of Systems Review of Systems  Constitutional: Negative for fatigue and fever.  HENT: Negative for mouth sores.   Eyes: Negative for visual disturbance.  Respiratory: Negative for shortness of breath.   Cardiovascular: Negative for chest pain.  Gastrointestinal: Negative for abdominal pain, nausea and vomiting.  Genitourinary: Negative for genital sores.  Musculoskeletal: Negative for arthralgias and joint swelling.  Skin: Positive for color change. Negative for rash and wound.  Neurological: Negative for dizziness, weakness, light-headedness and headaches.     Physical Exam Triage Vital Signs ED Triage Vitals  Enc Vitals Group     BP      Pulse      Resp      Temp      Temp src      SpO2      Weight      Height      Head Circumference      Peak Flow      Pain Score      Pain Loc      Pain Edu?      Excl. in [place]?    No data found.  Updated Vital Signs BP (!) 168/108 (BP Location: Left Arm)   Pulse (!) 125   Temp 99.2 F (37.3 C) (Oral)   Resp 18   SpO2 98%   Visual Acuity Right Eye Distance:   Left Eye Distance:   Bilateral Distance:    Right Eye Near:   Left Eye Near:    Bilateral Near:     Physical Exam Vitals and nursing note reviewed.  Constitutional:      Appearance: She is well-developed and well-nourished.     Comments: No acute distress  HENT:     Head: Normocephalic and atraumatic.     Nose: Nose normal.  Eyes:     Conjunctiva/sclera: Conjunctivae normal.  Cardiovascular:     Rate and Rhythm: Normal rate.  Pulmonary:     Effort: Pulmonary effort is normal. No respiratory distress.  Abdominal:     General: There is no distension.  Musculoskeletal:        General: Normal range of motion.     Cervical back: Neck supple.  Skin:    General:  Skin is warm and dry.     Comments: Left medial breast with area of  erythema, induration and fluctuance  Neurological:     Mental Status: She is alert and oriented to person, place, and time.  Psychiatric:        Mood and Affect: Mood and affect normal.      UC Treatments / Results  Labs (all labs ordered are listed, but only abnormal results are displayed) Labs Reviewed - No data to display  EKG   Radiology No results found.  Procedures Procedures (including critical care time)  Medications Ordered in UC Medications - No data to display  Initial Impression / Assessment and Plan / UC Course  I have reviewed the triage vital signs and the nursing notes.  Pertinent labs & imaging results that were available during my care of the patient were reviewed by me and considered in my medical decision making (see chart for details).     Left breast abscess-initially recommended I&D, but through discussion with patient opted to defer given patient on Xarelto and her anxiety regarding this procedure.  Initiated on doxycycline, continue warm compresses and monitor symptoms over the next 2 to 3 days.  Patient was understanding to return for I&D if not improving with antibiotics alone and could not or spontaneously draining.  Discussed strict return precautions. Patient verbalized understanding and is agreeable with plan.  Final Clinical Impressions(s) / UC Diagnoses   Final diagnoses:  Left breast abscess     Discharge Instructions     Begin doxycycline twice daily for 10 days Warm compresses Tylenol for pain Follow-up in 2 to 3 days if not seeing any improvement or worsening    ED Prescriptions    Medication Sig Dispense Auth. Provider   doxycycline (VIBRAMYCIN) 100 MG capsule Take 1 capsule (100 mg total) by mouth 2 (two) times daily for 10 days. 20 capsule Keeya Dyckman, [place] C, PA-C     PDMP not reviewed this encounter.   Janith Lima, [place] 02/11/21 1206

## 2021-02-11 NOTE — Discharge Instructions (Signed)
Begin doxycycline twice daily for 10 days Warm compresses Tylenol for pain Follow-up in 2 to 3 days if not seeing any improvement or worsening

## 2021-02-13 ENCOUNTER — Inpatient Hospital Stay: Payer: No Typology Code available for payment source

## 2021-02-13 ENCOUNTER — Inpatient Hospital Stay: Payer: No Typology Code available for payment source | Attending: Oncology

## 2021-02-13 ENCOUNTER — Other Ambulatory Visit: Payer: Self-pay

## 2021-02-13 VITALS — BP 165/108 | HR 129 | Resp 20

## 2021-02-13 DIAGNOSIS — Z841 Family history of disorders of kidney and ureter: Secondary | ICD-10-CM | POA: Diagnosis not present

## 2021-02-13 DIAGNOSIS — Z833 Family history of diabetes mellitus: Secondary | ICD-10-CM | POA: Insufficient documentation

## 2021-02-13 DIAGNOSIS — Z86718 Personal history of other venous thrombosis and embolism: Secondary | ICD-10-CM | POA: Diagnosis not present

## 2021-02-13 DIAGNOSIS — Z806 Family history of leukemia: Secondary | ICD-10-CM | POA: Insufficient documentation

## 2021-02-13 DIAGNOSIS — C50411 Malignant neoplasm of upper-outer quadrant of right female breast: Secondary | ICD-10-CM

## 2021-02-13 DIAGNOSIS — Z170 Estrogen receptor positive status [ER+]: Secondary | ICD-10-CM | POA: Insufficient documentation

## 2021-02-13 DIAGNOSIS — Z5112 Encounter for antineoplastic immunotherapy: Secondary | ICD-10-CM | POA: Diagnosis not present

## 2021-02-13 DIAGNOSIS — Z79899 Other long term (current) drug therapy: Secondary | ICD-10-CM | POA: Diagnosis not present

## 2021-02-13 DIAGNOSIS — K219 Gastro-esophageal reflux disease without esophagitis: Secondary | ICD-10-CM | POA: Diagnosis not present

## 2021-02-13 DIAGNOSIS — Z8261 Family history of arthritis: Secondary | ICD-10-CM | POA: Diagnosis not present

## 2021-02-13 DIAGNOSIS — Z8249 Family history of ischemic heart disease and other diseases of the circulatory system: Secondary | ICD-10-CM | POA: Insufficient documentation

## 2021-02-13 MED ORDER — GOSERELIN ACETATE 10.8 MG ~~LOC~~ IMPL
10.8000 mg | DRUG_IMPLANT | Freq: Once | SUBCUTANEOUS | Status: AC
  Administered 2021-02-13: 10.8 mg via SUBCUTANEOUS
  Filled 2021-02-13: qty 10.8

## 2021-02-13 NOTE — Progress Notes (Signed)
Ok to treat with elevated bp and pulse per Dr. Jana Hakim

## 2021-02-13 NOTE — Patient Instructions (Signed)

## 2021-03-02 MED FILL — XARELTO 20 MG TABLET: 20 | 30 days supply | Qty: 30 | Fill #3

## 2021-03-15 ENCOUNTER — Other Ambulatory Visit (HOSPITAL_BASED_OUTPATIENT_CLINIC_OR_DEPARTMENT_OTHER): Payer: Self-pay

## 2021-04-11 ENCOUNTER — Other Ambulatory Visit (HOSPITAL_COMMUNITY): Payer: Self-pay

## 2021-04-11 MED FILL — Rivaroxaban Tab 20 MG: ORAL | 30 days supply | Qty: 30 | Fill #0 | Status: AC

## 2021-05-07 ENCOUNTER — Other Ambulatory Visit: Payer: Self-pay

## 2021-05-07 DIAGNOSIS — Z170 Estrogen receptor positive status [ER+]: Secondary | ICD-10-CM

## 2021-05-07 DIAGNOSIS — C50411 Malignant neoplasm of upper-outer quadrant of right female breast: Secondary | ICD-10-CM

## 2021-05-08 ENCOUNTER — Inpatient Hospital Stay: Payer: No Typology Code available for payment source | Attending: Oncology

## 2021-05-08 ENCOUNTER — Other Ambulatory Visit: Payer: Self-pay

## 2021-05-08 ENCOUNTER — Inpatient Hospital Stay: Payer: No Typology Code available for payment source

## 2021-05-08 VITALS — BP 156/100 | HR 125 | Temp 98.9°F | Resp 18

## 2021-05-08 DIAGNOSIS — C50411 Malignant neoplasm of upper-outer quadrant of right female breast: Secondary | ICD-10-CM

## 2021-05-08 DIAGNOSIS — Z841 Family history of disorders of kidney and ureter: Secondary | ICD-10-CM | POA: Diagnosis not present

## 2021-05-08 DIAGNOSIS — Z806 Family history of leukemia: Secondary | ICD-10-CM | POA: Insufficient documentation

## 2021-05-08 DIAGNOSIS — Z8261 Family history of arthritis: Secondary | ICD-10-CM | POA: Diagnosis not present

## 2021-05-08 DIAGNOSIS — Z8249 Family history of ischemic heart disease and other diseases of the circulatory system: Secondary | ICD-10-CM | POA: Insufficient documentation

## 2021-05-08 DIAGNOSIS — Z5111 Encounter for antineoplastic chemotherapy: Secondary | ICD-10-CM | POA: Insufficient documentation

## 2021-05-08 DIAGNOSIS — Z79899 Other long term (current) drug therapy: Secondary | ICD-10-CM | POA: Diagnosis not present

## 2021-05-08 DIAGNOSIS — Z833 Family history of diabetes mellitus: Secondary | ICD-10-CM | POA: Insufficient documentation

## 2021-05-08 DIAGNOSIS — Z170 Estrogen receptor positive status [ER+]: Secondary | ICD-10-CM | POA: Diagnosis not present

## 2021-05-08 MED ORDER — GOSERELIN ACETATE 3.6 MG ~~LOC~~ IMPL
DRUG_IMPLANT | SUBCUTANEOUS | Status: AC
  Filled 2021-05-08: qty 3.6

## 2021-05-08 MED ORDER — GOSERELIN ACETATE 10.8 MG ~~LOC~~ IMPL
10.8000 mg | DRUG_IMPLANT | Freq: Once | SUBCUTANEOUS | Status: AC
  Administered 2021-05-08: 10.8 mg via SUBCUTANEOUS
  Filled 2021-05-08: qty 10.8

## 2021-05-08 NOTE — Patient Instructions (Signed)

## 2021-05-14 ENCOUNTER — Other Ambulatory Visit (HOSPITAL_COMMUNITY): Payer: Self-pay

## 2021-05-14 MED FILL — Rivaroxaban Tab 20 MG: ORAL | 30 days supply | Qty: 30 | Fill #1 | Status: AC

## 2021-06-26 ENCOUNTER — Other Ambulatory Visit (HOSPITAL_COMMUNITY): Payer: Self-pay

## 2021-06-26 MED FILL — Rivaroxaban Tab 20 MG: ORAL | 30 days supply | Qty: 30 | Fill #2 | Status: AC

## 2021-07-31 ENCOUNTER — Inpatient Hospital Stay: Payer: No Typology Code available for payment source | Attending: Oncology

## 2021-07-31 ENCOUNTER — Inpatient Hospital Stay: Payer: No Typology Code available for payment source

## 2021-07-31 ENCOUNTER — Other Ambulatory Visit: Payer: Self-pay

## 2021-07-31 VITALS — BP 137/105 | HR 100 | Temp 98.5°F | Resp 18

## 2021-07-31 DIAGNOSIS — Z170 Estrogen receptor positive status [ER+]: Secondary | ICD-10-CM | POA: Diagnosis not present

## 2021-07-31 DIAGNOSIS — Z5111 Encounter for antineoplastic chemotherapy: Secondary | ICD-10-CM | POA: Insufficient documentation

## 2021-07-31 DIAGNOSIS — C50411 Malignant neoplasm of upper-outer quadrant of right female breast: Secondary | ICD-10-CM

## 2021-07-31 LAB — CMP (CANCER CENTER ONLY)
ALT: 17 U/L (ref 0–44)
AST: 17 U/L (ref 15–41)
Albumin: 3.6 g/dL (ref 3.5–5.0)
Alkaline Phosphatase: 124 U/L (ref 38–126)
Anion gap: 10 (ref 5–15)
BUN: 15 mg/dL (ref 6–20)
CO2: 24 mmol/L (ref 22–32)
Calcium: 9.8 mg/dL (ref 8.9–10.3)
Chloride: 107 mmol/L (ref 98–111)
Creatinine: 1.03 mg/dL — ABNORMAL HIGH (ref 0.44–1.00)
GFR, Estimated: >60 mL/min (ref >60)
Glucose, Bld: 96 mg/dL (ref 70–99)
Potassium: 3.9 mmol/L (ref 3.5–5.1)
Sodium: 141 mmol/L (ref 135–145)
Total Bilirubin: 0.5 mg/dL (ref 0.3–1.2)
Total Protein: 7.6 g/dL (ref 6.5–8.1)

## 2021-07-31 LAB — CBC WITH DIFFERENTIAL (CANCER CENTER ONLY)
Abs Immature Granulocytes: 0.01 10*3/uL (ref 0.00–0.07)
Basophils Absolute: 0 10*3/uL (ref 0.0–0.1)
Basophils Relative: 1 %
Eosinophils Absolute: 0 10*3/uL (ref 0.0–0.5)
Eosinophils Relative: 1 %
HCT: 44.2 % (ref 36.0–46.0)
Hemoglobin: 15 g/dL (ref 12.0–15.0)
Immature Granulocytes: 0 %
Lymphocytes Relative: 34 %
Lymphs Abs: 2.2 10*3/uL (ref 0.7–4.0)
MCH: 33.9 pg (ref 26.0–34.0)
MCHC: 33.9 g/dL (ref 30.0–36.0)
MCV: 99.8 fL (ref 80.0–100.0)
Monocytes Absolute: 0.7 10*3/uL (ref 0.1–1.0)
Monocytes Relative: 11 %
Neutro Abs: 3.4 10*3/uL (ref 1.7–7.7)
Neutrophils Relative %: 53 %
Platelet Count: 218 10*3/uL (ref 150–400)
RBC: 4.43 MIL/uL (ref 3.87–5.11)
RDW: 11.6 % (ref 11.5–15.5)
WBC Count: 6.4 10*3/uL (ref 4.0–10.5)
nRBC: 0 % (ref 0.0–0.2)

## 2021-07-31 MED ORDER — GOSERELIN ACETATE 10.8 MG ~~LOC~~ IMPL
10.8000 mg | DRUG_IMPLANT | Freq: Once | SUBCUTANEOUS | Status: AC
Start: 2021-07-31 — End: 2021-07-31
  Administered 2021-07-31: 10.8 mg via SUBCUTANEOUS
  Filled 2021-07-31: qty 10.8

## 2021-07-31 NOTE — Progress Notes (Signed)
Mateo Flow dodd RN confirmed it is okay for pt to receive inj today. Also Pt refused for blood pressure to be rechecked and said she would follow up with her primary doctor.

## 2021-08-06 ENCOUNTER — Other Ambulatory Visit (HOSPITAL_COMMUNITY): Payer: Self-pay

## 2021-08-06 MED FILL — Rivaroxaban Tab 20 MG: ORAL | 30 days supply | Qty: 30 | Fill #3 | Status: AC

## 2021-09-24 ENCOUNTER — Other Ambulatory Visit (HOSPITAL_COMMUNITY): Payer: Self-pay

## 2021-09-24 ENCOUNTER — Encounter: Payer: Self-pay | Admitting: Oncology

## 2021-09-24 MED FILL — Rivaroxaban Tab 20 MG: ORAL | 30 days supply | Qty: 30 | Fill #4 | Status: AC

## 2021-10-22 ENCOUNTER — Other Ambulatory Visit: Payer: Self-pay

## 2021-10-22 DIAGNOSIS — C50411 Malignant neoplasm of upper-outer quadrant of right female breast: Secondary | ICD-10-CM

## 2021-10-22 NOTE — Progress Notes (Signed)
ID: BERNECE GALL   DOB: Dec 04, 1971  MR#: [id number]  CVU#:[id number]  Patient Care Team: Patient, No Pcp Per (Inactive) as PCP - General (General Practice) [name], MD as Consulting Physician (Obstetrics and Gynecology) [name], MD as Consulting Physician (Obstetrics and Gynecology) Amiree No, [name], MD as Consulting Physician (Oncology) OTHER MD:    CHIEF COMPLAINTS:  1) Hx of Right Breast Cancer        2) Hx of DVT/PE   CURRENT THERAPY: Observation  INTERVAL HISTORY: Jenna Gonzales returns today for follow-up of her breast cancer history and her hypercoagulable state.   She continues on goserrelin.  However she finds that coming here even if just for the shot makes her very anxious.  In addition she lost her brother to lung cancer this year and she is finding her work at [employer] incredibly stressful at present.  She is behind on mammography.  REVIEW OF SYSTEMS: Jenna Gonzales tells me she was exercising regularly and eating right and lost 13 pounds and felt great and then she started stress eating and stopped going to the gym and now she feels bad again.  She is tolerating the Xarelto well with no bleeding or bruising complications.  Aside from these issues a detailed review of systems today was stable   COVID 19 VACCINATION STATUS: fully vaccinated Therapist, music)   BREAST CANCER HISTORY: From the original intake note:  The patient saw Dr. Garwin Brothers for a routine gynecologic evaluation, and Dr. Garwin Brothers did a breast exam which showed a palpable mass in the right breast.  She was referred for a diagnostic mammography 11/03/2009, and Dr. Claudie Revering was able to confirm a palpable mass in the 10 o'clock position of the right breast 7 cm from the nipple.  There was no palpable adenopathy.  By mammography the breasts were dense, but there was a spiculated mass in the upper outer right breast corresponding to the palpable mass by ultrasound as measured 2.1 cm.  It was  hypoechoic, irregular, and poorly defined.  Ultrasound of the right axilla demonstrated several normal sized lymph nodes, several of them showing cortical thickening.    With this information, the patient was brought back on 11/19 for biopsy.  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  The lymph node biopsy obtained at the same time was negative.  The tumor was ER positive at 99%, PR positive at 20% with a low proliferation marker at 12%, and HER-2/neu was not amplified with a ratio of 1.45.  Bilateral breast MRIs were obtained 11/22.  This confirmed a 2.2 cm irregularly marginated enhancing mass in the upper outer right breast with a clip in the mass.  There were no other masses, and aside from post biopsy changes in the right axilla, there were no abnormal appearing lymph nodes.    The patient proceeded to right lumpectomy and sentinel lymph node sampling 12/09 under Dr. Excell Seltzer because the two sentinel lymph nodes provided positive for cancer.  Dr. Excell Seltzer proceeded to a right axillary lymph node dissection in the same procedure.  The final pathology [id number]) showed an invasive ductal carcinoma, grade 1, with negative although close margins (the closest was 1 mm posteriorly), with evidence of lymphovascular invasion and involving 2 out of 17 lymph nodes sampled (the 2 sentinel lymph nodes).   Her subsequent history is as detailed below.   PAST MEDICAL HISTORY: Past Medical History:  Diagnosis Date   Abnormal uterine bleeding (AUB)  Allergic rhinitis    Anxiety    Borderline hypertension    Endometrial polyp    History of breast cancer oncologist-  dr Ron Agee-- no recurrence   dx 11/ 2010  s/p  right lumpectomy w/ snl dissection 11-30-2009/  Stage 2B, DCIS, grade 1, (T2 N1),  ER/PR+,  HER2 negative/  chemo completed May 2011 and radiation completed July 2011 and completed anastrozole July 2016   History of cancer  chemotherapy    right breast-- complete 04/2010   History of DVT of lower extremity    09-22-2013  right lower extremitiy (femoral, popliteal, posterior tibial)   History of pulmonary embolus (PE)    09-22-2013   History of radiation therapy    right breast -- complete 07/ 2011   Osteopenia    Wears contact lenses   Mild GERD, history of a heart murmur, which was not appreciated today, history of borderline hypertension not under treatment, history of irritable bowel disease, and history of minimal arthritic changes involving particularly the index finger of the right hand.   PAST SURGICAL HISTORY: Past Surgical History:  Procedure Laterality Date   BREAST LUMPECTOMY WITH AXILLARY LYMPH NODE DISSECTION Right 11-30-2009   cryosurgery cervix     2012   DILATATION & CURETTAGE/HYSTEROSCOPY WITH MYOSURE N/A 04/29/2016   Procedure: DILATATION & CURETTAGE/POLYPECTOMY/HYSTEROSCOPY WITH MYOSURE;  Surgeon: [name], MD;  Location: [facility];  Service: Gynecology;  Laterality: N/A;  multiple endometrial polyps   PORT-A-CATH PLACEMENT /  REMOVAL  01-10-2010/  removal 05-09-2010    FAMILY HISTORY Family History  Problem Relation Age of Onset   Leukemia Father    Arthritis Other    Heart disease Other    Hypertension Other    Kidney disease Other    Diabetes Other    Diabetes Other   The patient's father died from leukemia at the age of 38 (I cannot tell from the history if this was chronic or acute).  The patient's mother is alive at age 21.  The patient has 2 sisters and 1 brother, all here in [place]. Her brother was diagnosed with lung cancer in 2020. She also has 2 half brothers living in [place].  There is no history of breast or ovarian cancer in the family.   GYNECOLOGIC HISTORY: (Updated 02/24/2014) She is GX, P1.  First pregnancy to term age 39.  Last menstrual period was in March 2011 with the initiation of chemotherapy. Hormone levels drawn in October  2014 showed patient to  be premenopausal.   SOCIAL HISTORY:    (Updated October 2023 The patient previously worked at [employer] in [employer].  She worked for the [employer] for 8 years. She now works works at [employer] she is single and lives by herself.. Her son Dahrahn no longer lives with her.  He "could never finish school.".  He does have 2 daughters who live with their mother here in [place].  The patient attends [employer].    ADVANCED DIRECTIVES: Not in place   HEALTH MAINTENANCE:  Social History   Tobacco Use   Smoking status: Never   Smokeless tobacco: Never  Substance Use Topics   Alcohol use: Yes    Alcohol/week: 0.0 standard drinks    Comment: socially   Drug use: No     Colonoscopy: Never  PAP: UTD/Dr. Garwin Brothers  Bone density: Never/Scheduled for 03/11/2014  Lipid panel: Not on file   Allergies  Allergen Reactions  Tamoxifen Other (See Comments)    Blood clots    Current Outpatient Medications  Medication Sig Dispense Refill   Loratadine 10 MG CAPS Take 1 capsule (10 mg total) by mouth as needed (allergies). 30 each    rivaroxaban (XARELTO) 20 MG TABS tablet TAKE 1 TABLET BY MOUTH ONCE DAILY WITH SUPPER 90 tablet 4   venlafaxine XR (EFFEXOR-XR) 37.5 MG 24 hr capsule TAKE ONE CAPSULE BY MOUTH DAILY WITH BREAKFAST FOR 7 DAYS, THEN INCREASE DOSE TO THE 75 MG DOSE 7 capsule 0   venlafaxine XR (EFFEXOR-XR) 75 MG 24 hr capsule TAKE 1 CAPSULE BY MOUTH DAILY WITH BREAKFAST. 30 capsule 3   No current facility-administered medications for this visit.   Facility-Administered Medications Ordered in Other Visits  Medication Dose Route Frequency Provider Last Rate Last Admin   goserelin (ZOLADEX) injection 10.8 mg  10.8 mg Subcutaneous Q90 days Boelter, Genelle Gather, NP   10.8 mg at 10/25/15 1625    OBJECTIVE: African-American woman who appears stated age  50:   10/23/21 1423  BP: (!) 154/95  Pulse: (!) 112   Resp: 18  Temp: 98.1 F (36.7 C)  SpO2: 98%   Wt Readings from Last 3 Encounters:  10/23/21 200 lb 6.4 oz (90.9 kg)  11/21/20 202 lb 4.8 oz (91.8 kg)  05/15/16 168 lb 12.8 oz (76.6 kg)   Body mass index is 33.35 kg/m.    ECOG FS:1 - Symptomatic but completely ambulatory  Sclerae unicteric, EOMs intact Wearing a mask No cervical or supraclavicular adenopathy Lungs no rales or rhonchi Heart regular rate and rhythm Abd soft, nontender, positive bowel sounds MSK no focal spinal tenderness, no upper extremity lymphedema Neuro: nonfocal, well oriented, appropriate affect Breasts: The right breast is status postlumpectomy and radiation.  There is no evidence of local recurrence.  In the medial left breast there is a pink flat which is unchanged from prior.  Both axillae are benign.  Images of the medial left breast 09/28/2019        LAB RESULTS: Lab Results  Component Value Date   WBC 6.6 10/23/2021   NEUTROABS 3.9 10/23/2021   HGB 15.1 (H) 10/23/2021   HCT 44.5 10/23/2021   MCV 99.8 10/23/2021   PLT 220 10/23/2021      Chemistry      Component Value Date/Time   NA 141 07/31/2021 1508   NA 141 11/25/2017 1523   K 3.9 07/31/2021 1508   K 3.7 11/25/2017 1523   CL 107 07/31/2021 1508   CL 106 01/19/2013 1507   CO2 24 07/31/2021 1508   CO2 23 11/25/2017 1523   BUN 15 07/31/2021 1508   BUN 11.8 11/25/2017 1523   CREATININE 1.03 (H) 07/31/2021 1508   CREATININE 0.9 11/25/2017 1523      Component Value Date/Time   CALCIUM 9.8 07/31/2021 1508   CALCIUM 9.7 11/25/2017 1523   ALKPHOS 124 07/31/2021 1508   ALKPHOS 123 11/25/2017 1523   AST 17 07/31/2021 1508   AST 16 11/25/2017 1523   ALT 17 07/31/2021 1508   ALT 16 11/25/2017 1523   BILITOT 0.5 07/31/2021 1508   BILITOT 0.37 11/25/2017 1523      STUDIES: No results found.   ASSESSMENT: 50 y.o.   woman   (1)  status post right breast upper outer quadrant lumpectomy and axillary lymph node  dissection December of 2010 for a T2 N1, stage IIB invasive ductal carcinoma, grade 1, strongly estrogen receptor positive, moderately progesterone receptor positive, HER-2  negative, with a low proliferation fraction;   (2)  treated adjuvantly with 6 cycles of docetaxel/ doxorubicin/ cyclophosphamide, completed in May of 2011,   (3)   followed by radiation completed July of 2011 at which time she started tamoxifen.  (4)  tamoxifen was discontinued in October 2014 when patient was diagnosed with a right lower extremity DVT and pulmonary embolism.   (a) anti-coagulation therapy with Xarelto, beginning 09/23/2013, completed 03/23/2014  (b) repeat Doppler ultrasonography 06/09/2014 finds no evidence of residual clot  (c) second acute right lower extremity DVT documented 05/06/2016 following uterine polypectomy  (d) hypercoagulable panel 05/27/2016 documents a lupus anticoagulant  (e) xarelto started May 2017, to be continued indefinitely  (5)  labs drawn in October 2014 confirmed a premenopausal status. Patient received her first Q3 month injection of goserelin on 12/02/2013, and then began on anastrozole   (7) completed 5 years of anastrozole as of July 2016. Goserelin continued at patient's request -- last dose 07/31/2021   PLAN: Jenna Gonzales is coming up on 12 years out from definitive surgery for her breast cancer with no evidence of disease recurrence.  This is very favorable.  She has been receiving goserelin every 3 months.  However she may well be already menopausal.  The only way to find out is to stop the medication and wait.  Her last dose was in August 2022.  Accordingly if she has not had a period by August 2023 most likely she is in full menopause.  She is agreeable to stopping the goserelin.  She understands that she does not need to use contraceptives if she is sexually active.  I am putting her in for some lab work namely an [facility] and estradiol in August of next year and for a visit in  September of next year.  However her plan is to establish herself with a primary care physician before then in which case she might not need to see [place].  She will call and let us know closer to that date.  We talked about how to handle stress and I encouraged her to get back to her diet and exercise program which was working well for her.  She knows to call for any other issue that may develop before the next visit.  Total encounter time 25 minutes.*   Kristian Mogg, [name], MD  10/23/21 2:48 PM Medical Oncology and Hematology [facility] [place], [place] [address] Tel. [phone]    Fax. [phone]   I, Wilburn Mylar, am acting as scribe for Dr. [name]. Jacelynn Hayton.  I, Lurline Del MD, have reviewed the above documentation for accuracy and completeness, and I agree with the above.   *Total Encounter Time as defined by the Centers for Medicare and Medicaid Services includes, in addition to the face-to-face time of a patient visit (documented in the note above) non-face-to-face time: obtaining and reviewing outside history, ordering and reviewing medications, tests or procedures, care coordination (communications with other health care professionals or caregivers) and documentation in the medical record.

## 2021-10-23 ENCOUNTER — Other Ambulatory Visit (HOSPITAL_COMMUNITY): Payer: Self-pay

## 2021-10-23 ENCOUNTER — Inpatient Hospital Stay: Payer: No Typology Code available for payment source

## 2021-10-23 ENCOUNTER — Inpatient Hospital Stay (HOSPITAL_BASED_OUTPATIENT_CLINIC_OR_DEPARTMENT_OTHER): Payer: No Typology Code available for payment source | Admitting: Oncology

## 2021-10-23 ENCOUNTER — Other Ambulatory Visit: Payer: Self-pay

## 2021-10-23 ENCOUNTER — Inpatient Hospital Stay: Payer: No Typology Code available for payment source | Attending: Oncology

## 2021-10-23 VITALS — BP 154/95 | HR 112 | Temp 98.1°F | Resp 18 | Ht 65.0 in | Wt 200.4 lb

## 2021-10-23 DIAGNOSIS — Z806 Family history of leukemia: Secondary | ICD-10-CM | POA: Diagnosis not present

## 2021-10-23 DIAGNOSIS — Z841 Family history of disorders of kidney and ureter: Secondary | ICD-10-CM | POA: Diagnosis not present

## 2021-10-23 DIAGNOSIS — Z8249 Family history of ischemic heart disease and other diseases of the circulatory system: Secondary | ICD-10-CM | POA: Diagnosis not present

## 2021-10-23 DIAGNOSIS — Z170 Estrogen receptor positive status [ER+]: Secondary | ICD-10-CM | POA: Insufficient documentation

## 2021-10-23 DIAGNOSIS — C50411 Malignant neoplasm of upper-outer quadrant of right female breast: Secondary | ICD-10-CM | POA: Insufficient documentation

## 2021-10-23 DIAGNOSIS — Z5111 Encounter for antineoplastic chemotherapy: Secondary | ICD-10-CM | POA: Diagnosis present

## 2021-10-23 DIAGNOSIS — Z79899 Other long term (current) drug therapy: Secondary | ICD-10-CM | POA: Diagnosis not present

## 2021-10-23 DIAGNOSIS — Z8261 Family history of arthritis: Secondary | ICD-10-CM | POA: Insufficient documentation

## 2021-10-23 DIAGNOSIS — Z833 Family history of diabetes mellitus: Secondary | ICD-10-CM | POA: Insufficient documentation

## 2021-10-23 LAB — CBC WITH DIFFERENTIAL (CANCER CENTER ONLY)
Abs Immature Granulocytes: 0.01 10*3/uL (ref 0.00–0.07)
Basophils Absolute: 0 10*3/uL (ref 0.0–0.1)
Basophils Relative: 0 %
Eosinophils Absolute: 0 10*3/uL (ref 0.0–0.5)
Eosinophils Relative: 0 %
HCT: 44.5 % (ref 36.0–46.0)
Hemoglobin: 15.1 g/dL — ABNORMAL HIGH (ref 12.0–15.0)
Immature Granulocytes: 0 %
Lymphocytes Relative: 32 %
Lymphs Abs: 2.1 10*3/uL (ref 0.7–4.0)
MCH: 33.9 pg (ref 26.0–34.0)
MCHC: 33.9 g/dL (ref 30.0–36.0)
MCV: 99.8 fL (ref 80.0–100.0)
Monocytes Absolute: 0.5 10*3/uL (ref 0.1–1.0)
Monocytes Relative: 8 %
Neutro Abs: 3.9 10*3/uL (ref 1.7–7.7)
Neutrophils Relative %: 60 %
Platelet Count: 220 10*3/uL (ref 150–400)
RBC: 4.46 MIL/uL (ref 3.87–5.11)
RDW: 11.4 % — ABNORMAL LOW (ref 11.5–15.5)
WBC Count: 6.6 10*3/uL (ref 4.0–10.5)
nRBC: 0 % (ref 0.0–0.2)

## 2021-10-23 LAB — CMP (CANCER CENTER ONLY)
ALT: 19 U/L (ref 0–44)
AST: 16 U/L (ref 15–41)
Albumin: 3.6 g/dL (ref 3.5–5.0)
Alkaline Phosphatase: 116 U/L (ref 38–126)
Anion gap: 9 (ref 5–15)
BUN: 10 mg/dL (ref 6–20)
CO2: 26 mmol/L (ref 22–32)
Calcium: 9.1 mg/dL (ref 8.9–10.3)
Chloride: 106 mmol/L (ref 98–111)
Creatinine: 0.86 mg/dL (ref 0.44–1.00)
GFR, Estimated: >60 mL/min (ref >60)
Glucose, Bld: 110 mg/dL — ABNORMAL HIGH (ref 70–99)
Potassium: 3.5 mmol/L (ref 3.5–5.1)
Sodium: 141 mmol/L (ref 135–145)
Total Bilirubin: 0.4 mg/dL (ref 0.3–1.2)
Total Protein: 7.5 g/dL (ref 6.5–8.1)

## 2021-10-23 MED ORDER — RIVAROXABAN 20 MG PO TABS
ORAL_TABLET | Freq: Every day | ORAL | 12 refills | Status: DC
  Filled 2021-10-23: qty 30, 30d supply, fill #0
  Filled 2021-12-10: qty 30, 30d supply, fill #1
  Filled 2022-01-24: qty 30, 30d supply, fill #2
  Filled 2022-02-26: qty 30, 30d supply, fill #3
  Filled 2022-03-28: qty 30, 30d supply, fill #4
  Filled 2022-05-13: qty 30, 30d supply, fill #5
  Filled 2022-06-20: qty 30, 30d supply, fill #6
  Filled 2022-08-01: qty 30, 30d supply, fill #7
  Filled 2022-09-06: qty 30, 30d supply, fill #8
  Filled 2022-10-09: qty 30, 30d supply, fill #9

## 2021-10-24 ENCOUNTER — Other Ambulatory Visit (HOSPITAL_COMMUNITY): Payer: Self-pay

## 2021-10-24 ENCOUNTER — Encounter: Payer: Self-pay | Admitting: Oncology

## 2021-12-10 ENCOUNTER — Other Ambulatory Visit (HOSPITAL_COMMUNITY): Payer: Self-pay

## 2021-12-11 ENCOUNTER — Other Ambulatory Visit (HOSPITAL_COMMUNITY): Payer: Self-pay

## 2021-12-11 MED ORDER — AMOXICILLIN 500 MG PO CAPS
ORAL_CAPSULE | ORAL | 0 refills | Status: DC
  Filled 2021-12-11: qty 30, 10d supply, fill #0

## 2022-01-24 ENCOUNTER — Other Ambulatory Visit (HOSPITAL_COMMUNITY): Payer: Self-pay

## 2022-02-26 ENCOUNTER — Other Ambulatory Visit (HOSPITAL_COMMUNITY): Payer: Self-pay

## 2022-03-28 ENCOUNTER — Other Ambulatory Visit (HOSPITAL_COMMUNITY): Payer: Self-pay

## 2022-03-30 ENCOUNTER — Other Ambulatory Visit (HOSPITAL_COMMUNITY): Payer: Self-pay

## 2022-05-13 ENCOUNTER — Other Ambulatory Visit (HOSPITAL_COMMUNITY): Payer: Self-pay

## 2022-05-22 ENCOUNTER — Other Ambulatory Visit (HOSPITAL_COMMUNITY): Payer: Self-pay

## 2022-05-22 ENCOUNTER — Telehealth: Payer: No Typology Code available for payment source | Admitting: Physician Assistant

## 2022-05-22 DIAGNOSIS — S39012A Strain of muscle, fascia and tendon of lower back, initial encounter: Secondary | ICD-10-CM | POA: Diagnosis not present

## 2022-05-22 MED ORDER — CYCLOBENZAPRINE HCL 10 MG PO TABS
10.0000 mg | ORAL_TABLET | Freq: Three times a day (TID) | ORAL | 0 refills | Status: AC | PRN
  Filled 2022-05-22: qty 15, 5d supply, fill #0

## 2022-05-22 NOTE — Progress Notes (Signed)
Virtual Visit Consent   Jenna Gonzales, you are scheduled for a virtual visit with a [facility] provider today. Just as with appointments in the office, your consent must be obtained to participate. Your consent will be active for this visit and any virtual visit you may have with one of our providers in the next 365 days. If you have a MyChart account, a copy of this consent can be sent to you electronically.  As this is a virtual visit, video technology does not allow for your provider to perform a traditional examination. This may limit your provider's ability to fully assess your condition. If your provider identifies any concerns that need to be evaluated in person or the need to arrange testing (such as labs, EKG, etc.), we will make arrangements to do so. Although advances in technology are sophisticated, we cannot ensure that it will always work on either your end or our end. If the connection with a video visit is poor, the visit may have to be switched to a telephone visit. With either a video or telephone visit, we are not always able to ensure that we have a secure connection.  By engaging in this virtual visit, you consent to the provision of healthcare and authorize for your insurance to be billed (if applicable) for the services provided during this visit. Depending on your insurance coverage, you may receive a charge related to this service.  I need to obtain your verbal consent now. Are you willing to proceed with your visit today? MARIGENE ERLER has provided verbal consent on 05/22/2022 for a virtual visit (video or telephone). Jenna Gonzales, [place]  Date: 05/22/2022 8:09 AM  Virtual Visit via Video Note   I, Jenna Gonzales, connected with  SHAKORA NORDQUIST  ([id number], [birth year]) on 05/22/22 at  8:00 AM EDT by a video-enabled telemedicine application and verified that I am speaking with the correct person using two identifiers.  Location: Patient:  Virtual Visit Location Patient: Home Provider: Virtual Visit Location Provider: Home Office   I discussed the limitations of evaluation and management by telemedicine and the availability of in person appointments. The patient expressed understanding and agreed to proceed.    History of Present Illness: Jenna Gonzales is a 51 y.o. who identifies as a female who was assigned female at birth, and is being seen today for possible low back strain. 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 Notes some pain starting in the R thoracic lower back right after this with some continued soreness in the area since that time. Notes that sitting is very uncomfortable and she has to sit for her job. Denies radiation of pain into her legs. Is ambulating without much issue, although certain positions bother her. Has tried heating pad with some relief but her heating pad started working.   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    Allergies:  Allergies  Allergen Reactions   Tamoxifen Other (See Comments)  Blood clots   Medications:  Current Outpatient Medications:    cyclobenzaprine (FLEXERIL) 10 MG tablet, Take 1 tablet (10 mg total) by mouth 3 (three) times daily as needed for muscle spasms., Disp: 15 tablet, Rfl: 0   Loratadine 10 MG CAPS, Take 1 capsule (10 mg total) by mouth as needed (allergies)., Disp: 30 each, Rfl:    rivaroxaban (XARELTO) 20 MG TABS tablet, TAKE 1 TABLET  BY MOUTH ONCE DAILY WITH SUPPER, Disp: 90 tablet, Rfl: 12  Observations/Objective: Patient is well-developed, well-nourished in no acute distress.  Resting comfortably at home.  Head is normocephalic, atraumatic.  No labored breathing. Speech is clear and coherent with logical content.  Patient is alert and oriented at baseline.   Assessment and Plan: 1. Back strain, initial encounter - cyclobenzaprine (FLEXERIL) 10 MG tablet; Take 1 tablet (10 mg total) by mouth 3 (three) times daily as needed for muscle spasms.  Dispense: 15 tablet; Refill: 0  Avoid heavy lifting and overexertion. Tylenol for pain. Continue heating pad and gentle ROM. Will start Flexeril up to TID PRN for the next few days. Work note written. Strict UC/ER precautions reviewed.   Follow Up Instructions: I discussed the assessment and treatment plan with the patient. The patient was provided an opportunity to ask questions and all were answered. The patient agreed with the plan and demonstrated an understanding of the instructions.  A copy of instructions were sent to the patient via MyChart unless otherwise noted below.   The patient was advised to call back or seek an in-person evaluation if the symptoms worsen or if the condition fails to improve as anticipated.  Time:  I spent 10 minutes with the patient via telehealth technology discussing the above problems/concerns.    Jenna Rio, PA-C

## 2022-05-22 NOTE — Patient Instructions (Signed)
  Jenna Gonzales, thank you for joining [name], PA-C for today's virtual visit.  While this provider is not your primary care provider (PCP), if your PCP is located in our provider database this encounter information will be shared with them immediately following your visit.  Consent: (Patient) Jenna Gonzales provided verbal consent for this virtual visit at the beginning of the encounter.  Current Medications:  Current Outpatient Medications:    cyclobenzaprine (FLEXERIL) 10 MG tablet, Take 1 tablet (10 mg total) by mouth 3 (three) times daily as needed for muscle spasms., Disp: 15 tablet, Rfl: 0   Loratadine 10 MG CAPS, Take 1 capsule (10 mg total) by mouth as needed (allergies)., Disp: 30 each, Rfl:    rivaroxaban (XARELTO) 20 MG TABS tablet, TAKE 1 TABLET BY MOUTH ONCE DAILY WITH SUPPER, Disp: 90 tablet, Rfl: 12   Medications ordered in this encounter:  Meds ordered this encounter  Medications   cyclobenzaprine (FLEXERIL) 10 MG tablet    Sig: Take 1 tablet (10 mg total) by mouth 3 (three) times daily as needed for muscle spasms.    Dispense:  15 tablet    Refill:  0    Order Specific Question:   Supervising Provider    Answer:   Sabra Heck, BRIAN [[id number]]     *If you need refills on other medications prior to your next appointment, please contact your pharmacy*  Follow-Up: Call back or seek an in-person evaluation if the symptoms worsen or if the condition fails to improve as anticipated.  Other Instructions Please avoid heavy lifting or overexertion.  Tylenol OTC for pain. Continue use of a heating pad.  Take the Flexeril in the evening as discussed. You can take up to three times daily but no driving or operating machinery while taking this medication as it can make you drowsy. If symptoms are not beginning to improve over next 48 hours, and continuing to improve until resolved, you need to be evaluated in-person. Please do not delay care.    If you  have been instructed to have an in-person evaluation today at a local Urgent Care facility, please use the link below. It will take you to a list of all of our available [facility], including address, phone number and hours of operation. Please do not delay care.  [facility]  If you or a family member do not have a primary care provider, use the link below to schedule a visit and establish care. When you choose a [facility] primary care physician or advanced practice provider, you gain a long-term partner in health. Find a Primary Care Provider  Learn more about 's in-office and virtual care options: [facility]

## 2022-06-20 ENCOUNTER — Other Ambulatory Visit (HOSPITAL_COMMUNITY): Payer: Self-pay

## 2022-08-01 ENCOUNTER — Other Ambulatory Visit (HOSPITAL_COMMUNITY): Payer: Self-pay

## 2022-08-05 ENCOUNTER — Other Ambulatory Visit: Payer: Self-pay

## 2022-08-05 DIAGNOSIS — Z170 Estrogen receptor positive status [ER+]: Secondary | ICD-10-CM

## 2022-08-06 ENCOUNTER — Inpatient Hospital Stay: Payer: No Typology Code available for payment source | Attending: Hematology and Oncology

## 2022-08-27 ENCOUNTER — Inpatient Hospital Stay
Payer: No Typology Code available for payment source | Attending: Hematology and Oncology | Admitting: Hematology and Oncology

## 2022-08-27 NOTE — Progress Notes (Deleted)
ID: Jenna Gonzales   DOB: [birth year]  MR#: [id number]  ZCH#:[id number]  Patient Care Team: Patient, No Pcp Per as PCP - General (General Practice) [name], MD as Consulting Physician (Obstetrics and Gynecology) [name], MD as Consulting Physician (Obstetrics and Gynecology) [name], [name], MD (Inactive) as Consulting Physician (Oncology) OTHER MD:    CHIEF COMPLAINTS:  1) Hx of Right Breast Cancer        2) Hx of DVT/PE   CURRENT THERAPY: Observation  INTERVAL HISTORY: Jenna Gonzales returns today for follow-up of her breast cancer history and her hypercoagulable state.   She continues on goserrelin.  However she finds that coming here even if just for the shot makes her very anxious.  In addition she lost her brother to lung cancer this year and she is finding her work at [employer] incredibly stressful at present.  She is behind on mammography.  REVIEW OF SYSTEMS: Jenna Gonzales tells me she was exercising regularly and eating right and lost 13 pounds and felt great and then she started stress eating and stopped going to the gym and now she feels bad again.  She is tolerating the Xarelto well with no bleeding or bruising complications.  Aside from these issues a detailed review of systems today was stable   COVID 19 VACCINATION STATUS: fully vaccinated Therapist, music)   BREAST CANCER HISTORY: From the original intake note:  The patient saw Dr. Garwin Brothers for a routine gynecologic evaluation, and Dr. Garwin Brothers did a breast exam which showed a palpable mass in the right breast.  She was referred for a diagnostic mammography 11/03/2009, and Dr. Claudie Revering was able to confirm a palpable mass in the 10 o'clock position of the right breast 7 cm from the nipple.  There was no palpable adenopathy.  By mammography the breasts were dense, but there was a spiculated mass in the upper outer right breast corresponding to the palpable mass by ultrasound as measured 2.1 cm.  It was  hypoechoic, irregular, and poorly defined.  Ultrasound of the right axilla demonstrated several normal sized lymph nodes, several of them showing cortical thickening.    With this information, the patient was brought back on 11/19 for biopsy.  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  The lymph node biopsy obtained at the same time was negative.  The tumor was ER positive at 99%, PR positive at 20% with a low proliferation marker at 12%, and HER-2/neu was not amplified with a ratio of 1.45.  Bilateral breast MRIs were obtained 11/22.  This confirmed a 2.2 cm irregularly marginated enhancing mass in the upper outer right breast with a clip in the mass.  There were no other masses, and aside from post biopsy changes in the right axilla, there were no abnormal appearing lymph nodes.    The patient proceeded to right lumpectomy and sentinel lymph node sampling 12/09 under Dr. Excell Seltzer because the two sentinel lymph nodes provided positive for cancer.  Dr. Excell Seltzer proceeded to a right axillary lymph node dissection in the same procedure.  The final pathology [id number]) showed an invasive ductal carcinoma, grade 1, with negative although close margins (the closest was 1 mm posteriorly), with evidence of lymphovascular invasion and involving 2 out of 17 lymph nodes sampled (the 2 sentinel lymph nodes).   Her subsequent history is as detailed below.   PAST MEDICAL HISTORY: Past Medical History:  Diagnosis Date   Abnormal uterine bleeding (AUB)  Allergic rhinitis    Anxiety    Borderline hypertension    Endometrial polyp    History of breast cancer oncologist-  dr Ron Agee-- no recurrence   dx 11/ 2010  s/p  right lumpectomy w/ snl dissection 11-30-2009/  Stage 2B, DCIS, grade 1, (T2 N1),  ER/PR+,  HER2 negative/  chemo completed May 2011 and radiation completed July 2011 and completed anastrozole July 2016   History of cancer  chemotherapy    right breast-- complete 04/2010   History of DVT of lower extremity    09-22-2013  right lower extremitiy (femoral, popliteal, posterior tibial)   History of pulmonary embolus (PE)    09-22-2013   History of radiation therapy    right breast -- complete 07/ 2011   Osteopenia    Wears contact lenses   Mild GERD, history of a heart murmur, which was not appreciated today, history of borderline hypertension not under treatment, history of irritable bowel disease, and history of minimal arthritic changes involving particularly the index finger of the right hand.   PAST SURGICAL HISTORY: Past Surgical History:  Procedure Laterality Date   BREAST LUMPECTOMY WITH AXILLARY LYMPH NODE DISSECTION Right 11-30-2009   cryosurgery cervix     2012   DILATATION & CURETTAGE/HYSTEROSCOPY WITH MYOSURE N/A 04/29/2016   Procedure: DILATATION & CURETTAGE/POLYPECTOMY/HYSTEROSCOPY WITH MYOSURE;  Surgeon: [name], MD;  Location: [facility];  Service: Gynecology;  Laterality: N/A;  multiple endometrial polyps   PORT-A-CATH PLACEMENT /  REMOVAL  01-10-2010/  removal 05-09-2010    FAMILY HISTORY Family History  Problem Relation Age of Onset   Leukemia Father    Arthritis Other    Heart disease Other    Hypertension Other    Kidney disease Other    Diabetes Other    Diabetes Other   The patient's father died from leukemia at the age of 77 (I cannot tell from the history if this was chronic or acute).  The patient's mother is alive at age 98.  The patient has 2 sisters and 1 brother, all here in [place]. Her brother was diagnosed with lung cancer in 2020. She also has 2 half brothers living in [place].  There is no history of breast or ovarian cancer in the family.   GYNECOLOGIC HISTORY: (Updated 02/24/2014) She is GX, P1.  First pregnancy to term age 72.  Last menstrual period was in March 2011 with the initiation of chemotherapy. Hormone levels drawn in October  2014 showed patient to  be premenopausal.   SOCIAL HISTORY:    (Updated October 2023 The patient previously worked at [employer] in [employer].  She worked for the [employer] for 8 years. She now works works at [employer] she is single and lives by herself.. Her son Dahrahn no longer lives with her.  He "could never finish school.".  He does have 2 daughters who live with their mother here in [place].  The patient attends [employer].    ADVANCED DIRECTIVES: Not in place   HEALTH MAINTENANCE:  Social History   Tobacco Use   Smoking status: Never   Smokeless tobacco: Never  Substance Use Topics   Alcohol use: Yes    Alcohol/week: 0.0 standard drinks of alcohol    Comment: socially   Drug use: No     Colonoscopy: Never  PAP: UTD/Dr. Garwin Brothers  Bone density: Never/Scheduled for 03/11/2014  Lipid panel: Not on file   Allergies  Allergen  Reactions   Tamoxifen Other (See Comments)    Blood clots    Current Outpatient Medications  Medication Sig Dispense Refill   cyclobenzaprine (FLEXERIL) 10 MG tablet Take 1 tablet (10 mg total) by mouth 3 (three) times daily as needed for muscle spasms. 15 tablet 0   Loratadine 10 MG CAPS Take 1 capsule (10 mg total) by mouth as needed (allergies). 30 each    rivaroxaban (XARELTO) 20 MG TABS tablet TAKE 1 TABLET BY MOUTH ONCE DAILY WITH SUPPER 90 tablet 12   No current facility-administered medications for this visit.    OBJECTIVE: African-American woman who appears stated age  There were no vitals filed for this visit.  Wt Readings from Last 3 Encounters:  10/23/21 200 lb 6.4 oz (90.9 kg)  11/21/20 202 lb 4.8 oz (91.8 kg)  05/15/16 168 lb 12.8 oz (76.6 kg)   There is no height or weight on file to calculate BMI.    ECOG FS:1 - Symptomatic but completely ambulatory  Sclerae unicteric, EOMs intact Wearing a mask No cervical or supraclavicular adenopathy Lungs no rales or  rhonchi Heart regular rate and rhythm Abd soft, nontender, positive bowel sounds MSK no focal spinal tenderness, no upper extremity lymphedema Neuro: nonfocal, well oriented, appropriate affect Breasts: The right breast is status postlumpectomy and radiation.  There is no evidence of local recurrence.  In the medial left breast there is a pink flat which is unchanged from prior.  Both axillae are benign.  Images of the medial left breast 09/28/2019        LAB RESULTS: Lab Results  Component Value Date   WBC 6.6 10/23/2021   NEUTROABS 3.9 10/23/2021   HGB 15.1 (H) 10/23/2021   HCT 44.5 10/23/2021   MCV 99.8 10/23/2021   PLT 220 10/23/2021      Chemistry      Component Value Date/Time   NA 141 10/23/2021 1414   NA 141 11/25/2017 1523   K 3.5 10/23/2021 1414   K 3.7 11/25/2017 1523   CL 106 10/23/2021 1414   CL 106 01/19/2013 1507   CO2 26 10/23/2021 1414   CO2 23 11/25/2017 1523   BUN 10 10/23/2021 1414   BUN 11.8 11/25/2017 1523   CREATININE 0.86 10/23/2021 1414   CREATININE 0.9 11/25/2017 1523      Component Value Date/Time   CALCIUM 9.1 10/23/2021 1414   CALCIUM 9.7 11/25/2017 1523   ALKPHOS 116 10/23/2021 1414   ALKPHOS 123 11/25/2017 1523   AST 16 10/23/2021 1414   AST 16 11/25/2017 1523   ALT 19 10/23/2021 1414   ALT 16 11/25/2017 1523   BILITOT 0.4 10/23/2021 1414   BILITOT 0.37 11/25/2017 1523      STUDIES: No results found.   ASSESSMENT: 51 y.o.  [place] woman   (1)  status post right breast upper outer quadrant lumpectomy and axillary lymph node dissection December of 2010 for a T2 N1, stage IIB invasive ductal carcinoma, grade 1, strongly estrogen receptor positive, moderately progesterone receptor positive, HER-2 negative, with a low proliferation fraction;   (2)  treated adjuvantly with 6 cycles of docetaxel/ doxorubicin/ cyclophosphamide, completed in May of 2011,   (3)   followed by radiation completed July of 2011 at which time she  started tamoxifen.  (4)  tamoxifen was discontinued in October 2014 when patient was diagnosed with a right lower extremity DVT and pulmonary embolism.   (a) anti-coagulation therapy with Xarelto, beginning 09/23/2013, completed 03/23/2014  (b) repeat Doppler  ultrasonography 06/09/2014 finds no evidence of residual clot  (c) second acute right lower extremity DVT documented 05/06/2016 following uterine polypectomy  (d) hypercoagulable panel 05/27/2016 documents a lupus anticoagulant  (e) xarelto started May 2017, to be continued indefinitely  (5)  labs drawn in October 2014 confirmed a premenopausal status. Patient received her first Q3 month injection of goserelin on 12/02/2013, and then began on anastrozole   (7) completed 5 years of anastrozole as of July 2016. Goserelin continued at patient's request -- last dose 07/31/2021   PLAN: Jenna Gonzales is coming up on 12 years out from definitive surgery for her breast cancer with no evidence of disease recurrence.  This is very favorable.  She has been receiving goserelin every 3 months.  However she may well be already menopausal.  The only way to find out is to stop the medication and wait.  Her last dose was in August 2022.  Accordingly if she has not had a period by August 2023 most likely she is in full menopause.  She is agreeable to stopping the goserelin.  She understands that she does not need to use contraceptives if she is sexually active.  I am putting her in for some lab work namely an [facility] and estradiol in August of next year and for a visit in September of next year.  However her plan is to establish herself with a primary care physician before then in which case she might not need to see [place].  She will call and let us know closer to that date.  We talked about how to handle stress and I encouraged her to get back to her diet and exercise program which was working well for her.  She knows to call for any other issue that may develop before  the next visit.  Total encounter time 25 minutes.*   [name], [name], MD  08/27/22 2:56 PM Medical Oncology and Hematology [facility] [place], [place] [address] Tel. [phone]    Fax. [phone]   I, Wilburn Mylar, am acting as scribe for Dr. [name]. [name].  I, Lurline Del MD, have reviewed the above documentation for accuracy and completeness, and I agree with the above.   *Total Encounter Time as defined by the Centers for Medicare and Medicaid Services includes, in addition to the face-to-face time of a patient visit (documented in the note above) non-face-to-face time: obtaining and reviewing outside history, ordering and reviewing medications, tests or procedures, care coordination (communications with other health care professionals or caregivers) and documentation in the medical record.

## 2022-09-03 ENCOUNTER — Other Ambulatory Visit (HOSPITAL_COMMUNITY): Payer: Self-pay

## 2022-09-03 ENCOUNTER — Ambulatory Visit: Payer: No Typology Code available for payment source | Admitting: Hematology and Oncology

## 2022-09-06 ENCOUNTER — Other Ambulatory Visit (HOSPITAL_COMMUNITY): Payer: Self-pay

## 2022-10-09 ENCOUNTER — Other Ambulatory Visit (HOSPITAL_COMMUNITY): Payer: Self-pay

## 2022-10-23 ENCOUNTER — Ambulatory Visit: Payer: No Typology Code available for payment source | Admitting: Hematology and Oncology

## 2022-11-06 ENCOUNTER — Inpatient Hospital Stay
Payer: No Typology Code available for payment source | Attending: Hematology and Oncology | Admitting: Hematology and Oncology

## 2022-11-06 ENCOUNTER — Other Ambulatory Visit (HOSPITAL_COMMUNITY): Payer: Self-pay

## 2022-11-06 ENCOUNTER — Other Ambulatory Visit: Payer: Self-pay | Admitting: Hematology and Oncology

## 2022-11-07 ENCOUNTER — Other Ambulatory Visit (HOSPITAL_COMMUNITY): Payer: Self-pay

## 2022-11-07 MED ORDER — RIVAROXABAN 20 MG PO TABS
ORAL_TABLET | Freq: Every day | ORAL | 12 refills | Status: DC
Start: 2022-11-07 — End: 2023-12-09
  Filled 2022-11-07: qty 30, 30d supply, fill #0
  Filled 2022-12-04 (×2): qty 30, 30d supply, fill #1
  Filled 2023-01-07: qty 30, 30d supply, fill #2
  Filled 2023-02-06: qty 30, 30d supply, fill #3
  Filled 2023-03-10: qty 30, 30d supply, fill #4
  Filled 2023-04-09: qty 30, 30d supply, fill #5
  Filled 2023-05-08: qty 30, 30d supply, fill #6
  Filled 2023-06-05: qty 30, 30d supply, fill #7
  Filled 2023-07-07: qty 30, 30d supply, fill #8
  Filled 2023-08-01: qty 30, 30d supply, fill #9
  Filled 2023-09-01: qty 30, 30d supply, fill #10
  Filled 2023-09-30: qty 30, 30d supply, fill #11
  Filled 2023-11-03: qty 30, 30d supply, fill #12

## 2022-12-04 ENCOUNTER — Other Ambulatory Visit: Payer: Self-pay

## 2022-12-04 ENCOUNTER — Other Ambulatory Visit (HOSPITAL_COMMUNITY): Payer: Self-pay

## 2022-12-05 ENCOUNTER — Other Ambulatory Visit: Payer: Self-pay

## 2023-01-07 ENCOUNTER — Other Ambulatory Visit (HOSPITAL_COMMUNITY): Payer: Self-pay

## 2023-02-06 ENCOUNTER — Other Ambulatory Visit: Payer: Self-pay

## 2023-02-06 ENCOUNTER — Other Ambulatory Visit (HOSPITAL_COMMUNITY): Payer: Self-pay

## 2023-03-10 ENCOUNTER — Other Ambulatory Visit (HOSPITAL_COMMUNITY): Payer: Self-pay

## 2023-03-27 ENCOUNTER — Other Ambulatory Visit: Payer: Self-pay | Admitting: Family Medicine

## 2023-03-27 DIAGNOSIS — M659 Synovitis and tenosynovitis, unspecified: Secondary | ICD-10-CM

## 2023-04-05 ENCOUNTER — Other Ambulatory Visit: Payer: No Typology Code available for payment source

## 2023-04-09 ENCOUNTER — Other Ambulatory Visit (HOSPITAL_COMMUNITY): Payer: Self-pay

## 2023-04-11 ENCOUNTER — Ambulatory Visit
Admission: RE | Admit: 2023-04-11 | Discharge: 2023-04-11 | Disposition: A | Discharge status: Home / Self Care | Payer: No Typology Code available for payment source | Source: Ambulatory Visit | Attending: Family Medicine | Admitting: Family Medicine

## 2023-04-11 DIAGNOSIS — M659 Synovitis and tenosynovitis, unspecified: Secondary | ICD-10-CM

## 2023-05-08 ENCOUNTER — Other Ambulatory Visit (HOSPITAL_COMMUNITY): Payer: Self-pay

## 2023-06-05 ENCOUNTER — Other Ambulatory Visit (HOSPITAL_COMMUNITY): Payer: Self-pay

## 2023-07-07 ENCOUNTER — Other Ambulatory Visit (HOSPITAL_COMMUNITY): Payer: Self-pay

## 2023-08-01 ENCOUNTER — Other Ambulatory Visit (HOSPITAL_COMMUNITY): Payer: Self-pay

## 2023-09-01 ENCOUNTER — Other Ambulatory Visit (HOSPITAL_COMMUNITY): Payer: Self-pay

## 2023-09-01 ENCOUNTER — Encounter: Payer: Self-pay | Admitting: Oncology

## 2023-09-30 ENCOUNTER — Other Ambulatory Visit (HOSPITAL_COMMUNITY): Payer: Self-pay

## 2023-10-08 ENCOUNTER — Encounter: Payer: No Typology Code available for payment source | Admitting: Internal Medicine

## 2023-11-03 ENCOUNTER — Other Ambulatory Visit (HOSPITAL_COMMUNITY): Payer: Self-pay

## 2023-12-09 ENCOUNTER — Other Ambulatory Visit: Payer: Self-pay | Admitting: Hematology and Oncology

## 2023-12-09 ENCOUNTER — Other Ambulatory Visit (HOSPITAL_COMMUNITY): Payer: Self-pay

## 2023-12-09 MED ORDER — RIVAROXABAN 20 MG PO TABS
ORAL_TABLET | Freq: Every day | ORAL | 0 refills | Status: AC
  Filled 2023-12-09: qty 30, 30d supply, fill #0
  Filled 2024-02-02: qty 30, 30d supply, fill #1
  Filled 2024-03-16: qty 30, 30d supply, fill #2

## 2024-03-17 ENCOUNTER — Other Ambulatory Visit (HOSPITAL_COMMUNITY): Payer: Self-pay
